# Patient Record
Sex: Male | Born: 1950
Health system: Southern US, Community
[De-identification: ages and names within clinical notes are randomized; demographics above are authoritative.]

## PROBLEM LIST (undated history)

## (undated) DIAGNOSIS — E785 Hyperlipidemia, unspecified: Secondary | ICD-10-CM

## (undated) DIAGNOSIS — M109 Gout, unspecified: Secondary | ICD-10-CM

## (undated) DIAGNOSIS — Z9710 Presence of artificial limb (complete) (partial), unspecified: Secondary | ICD-10-CM

## (undated) DIAGNOSIS — I10 Essential (primary) hypertension: Secondary | ICD-10-CM

## (undated) DIAGNOSIS — Z89512 Acquired absence of left leg below knee: Secondary | ICD-10-CM

## (undated) DIAGNOSIS — I639 Cerebral infarction, unspecified: Secondary | ICD-10-CM

## (undated) DIAGNOSIS — I739 Peripheral vascular disease, unspecified: Secondary | ICD-10-CM

## (undated) DIAGNOSIS — R413 Other amnesia: Secondary | ICD-10-CM

## (undated) HISTORY — PX: TONSILLECTOMY: SUR1361

---

## 2006-09-10 ENCOUNTER — Ambulatory Visit: Payer: Self-pay | Admitting: Gastroenterology

## 2006-11-25 ENCOUNTER — Ambulatory Visit: Payer: Self-pay | Admitting: Family Medicine

## 2006-11-29 ENCOUNTER — Ambulatory Visit: Payer: Self-pay | Admitting: Oncology

## 2006-12-14 ENCOUNTER — Ambulatory Visit: Payer: Self-pay | Admitting: Oncology

## 2007-01-14 ENCOUNTER — Ambulatory Visit: Payer: Self-pay | Admitting: Oncology

## 2007-09-15 ENCOUNTER — Ambulatory Visit: Payer: Self-pay | Admitting: Oncology

## 2007-09-22 ENCOUNTER — Ambulatory Visit: Payer: Self-pay | Admitting: Oncology

## 2007-10-03 ENCOUNTER — Ambulatory Visit: Payer: Self-pay | Admitting: Oncology

## 2007-10-16 ENCOUNTER — Ambulatory Visit: Payer: Self-pay | Admitting: Oncology

## 2007-11-16 ENCOUNTER — Ambulatory Visit: Payer: Self-pay | Admitting: Oncology

## 2016-02-21 DIAGNOSIS — R079 Chest pain, unspecified: Secondary | ICD-10-CM | POA: Diagnosis not present

## 2016-02-21 DIAGNOSIS — I10 Essential (primary) hypertension: Secondary | ICD-10-CM | POA: Diagnosis not present

## 2016-02-21 DIAGNOSIS — I426 Alcoholic cardiomyopathy: Secondary | ICD-10-CM | POA: Diagnosis not present

## 2016-02-22 DIAGNOSIS — I426 Alcoholic cardiomyopathy: Secondary | ICD-10-CM | POA: Diagnosis not present

## 2016-02-22 DIAGNOSIS — E784 Other hyperlipidemia: Secondary | ICD-10-CM | POA: Diagnosis not present

## 2016-02-22 DIAGNOSIS — I10 Essential (primary) hypertension: Secondary | ICD-10-CM | POA: Diagnosis not present

## 2016-02-22 DIAGNOSIS — R079 Chest pain, unspecified: Secondary | ICD-10-CM | POA: Diagnosis not present

## 2016-02-22 DIAGNOSIS — Z125 Encounter for screening for malignant neoplasm of prostate: Secondary | ICD-10-CM | POA: Diagnosis not present

## 2016-02-22 DIAGNOSIS — R5381 Other malaise: Secondary | ICD-10-CM | POA: Diagnosis not present

## 2016-02-24 DIAGNOSIS — I426 Alcoholic cardiomyopathy: Secondary | ICD-10-CM | POA: Diagnosis not present

## 2016-02-24 DIAGNOSIS — R079 Chest pain, unspecified: Secondary | ICD-10-CM | POA: Diagnosis not present

## 2016-03-06 DIAGNOSIS — I10 Essential (primary) hypertension: Secondary | ICD-10-CM | POA: Diagnosis not present

## 2016-04-06 DIAGNOSIS — I119 Hypertensive heart disease without heart failure: Secondary | ICD-10-CM | POA: Diagnosis not present

## 2016-04-12 DIAGNOSIS — I119 Hypertensive heart disease without heart failure: Secondary | ICD-10-CM | POA: Diagnosis not present

## 2016-06-20 ENCOUNTER — Emergency Department
Admission: EM | Admit: 2016-06-20 | Discharge: 2016-06-20 | Disposition: A | Discharge status: Home / Self Care | Payer: Medicare Other | Attending: Emergency Medicine | Admitting: Emergency Medicine

## 2016-06-20 ENCOUNTER — Emergency Department: Payer: Medicare Other

## 2016-06-20 ENCOUNTER — Encounter: Payer: Self-pay | Admitting: Emergency Medicine

## 2016-06-20 DIAGNOSIS — Z7982 Long term (current) use of aspirin: Secondary | ICD-10-CM | POA: Diagnosis not present

## 2016-06-20 DIAGNOSIS — Z79899 Other long term (current) drug therapy: Secondary | ICD-10-CM | POA: Diagnosis not present

## 2016-06-20 DIAGNOSIS — Z87891 Personal history of nicotine dependence: Secondary | ICD-10-CM | POA: Insufficient documentation

## 2016-06-20 DIAGNOSIS — I10 Essential (primary) hypertension: Secondary | ICD-10-CM | POA: Diagnosis not present

## 2016-06-20 DIAGNOSIS — R42 Dizziness and giddiness: Secondary | ICD-10-CM | POA: Insufficient documentation

## 2016-06-20 DIAGNOSIS — R001 Bradycardia, unspecified: Secondary | ICD-10-CM | POA: Diagnosis not present

## 2016-06-20 DIAGNOSIS — I119 Hypertensive heart disease without heart failure: Secondary | ICD-10-CM | POA: Diagnosis not present

## 2016-06-20 HISTORY — DX: Essential (primary) hypertension: I10

## 2016-06-20 LAB — CBC
HEMATOCRIT: 38.2 % — AB (ref 40.0–52.0)
HEMOGLOBIN: 13.1 g/dL (ref 13.0–18.0)
MCH: 28.7 pg (ref 26.0–34.0)
MCHC: 34.4 g/dL (ref 32.0–36.0)
MCV: 83.5 fL (ref 80.0–100.0)
Platelets: 258 10*3/uL (ref 150–440)
RBC: 4.57 MIL/uL (ref 4.40–5.90)
RDW: 14.7 % — ABNORMAL HIGH (ref 11.5–14.5)
WBC: 14.9 10*3/uL — ABNORMAL HIGH (ref 3.8–10.6)

## 2016-06-20 LAB — TROPONIN I
Troponin I: <0.03 ng/mL (ref <0.03)
Troponin I: <0.03 ng/mL (ref <0.03)

## 2016-06-20 LAB — COMPREHENSIVE METABOLIC PANEL
ALBUMIN: 3.9 g/dL (ref 3.5–5.0)
ALK PHOS: 84 U/L (ref 38–126)
ALT: 6 U/L — ABNORMAL LOW (ref 17–63)
ANION GAP: 7 (ref 5–15)
AST: 17 U/L (ref 15–41)
BILIRUBIN TOTAL: 0.2 mg/dL — AB (ref 0.3–1.2)
BUN: 29 mg/dL — AB (ref 6–20)
CALCIUM: 8.8 mg/dL — AB (ref 8.9–10.3)
CO2: 26 mmol/L (ref 22–32)
Chloride: 99 mmol/L — ABNORMAL LOW (ref 101–111)
Creatinine, Ser: 1.92 mg/dL — ABNORMAL HIGH (ref 0.61–1.24)
GFR calc Af Amer: 40 mL/min — ABNORMAL LOW (ref >60)
GFR, EST NON AFRICAN AMERICAN: 34 mL/min — AB (ref >60)
GLUCOSE: 105 mg/dL — AB (ref 65–99)
Potassium: 3.6 mmol/L (ref 3.5–5.1)
Sodium: 132 mmol/L — ABNORMAL LOW (ref 135–145)
TOTAL PROTEIN: 8.4 g/dL — AB (ref 6.5–8.1)

## 2016-06-20 NOTE — ED Provider Notes (Signed)
[facility] Emergency Department Provider Note   ____________________________________________   None    (approximate)  I have reviewed the triage vital signs and the nursing notes.   HISTORY  Chief Complaint Dizziness    HPI Jordan Mills is a 65 y.o. male patient reports he works third shift. At about 4:30 while he was at work he got dizzy vertiginous spinning both arms began to be achy and his right great toe got numb. Only the great toe got numb. When I see him in the ER he is no longer spinning or vertiginous at all he denies ever having nausea or vomiting he says his arms are still a little bit achy but much better and his right great toe still feels numb. On examination the touching his toe feels normal. Things seem to influence the symptoms including exertion. He was not short of breath or sweaty with the symptoms. He had no chest pain.   Past Medical History:  Diagnosis Date  . Hypertension     There are no active problems to display for this patient.   History reviewed. No pertinent surgical history.  Prior to Admission medications   Medication Sig Start Date End Date Taking? Authorizing Provider  amLODipine (NORVASC) 5 MG tablet Take 5 mg by mouth 2 (two) times daily.   Yes Historical Provider, MD  aspirin EC 81 MG tablet Take 81 mg by mouth daily.   Yes Historical Provider, MD  lisinopril-hydrochlorothiazide (PRINZIDE,ZESTORETIC) 20-12.5 MG tablet Take 2 tablets by mouth daily.   Yes Historical Provider, MD  metoprolol (LOPRESSOR) 50 MG tablet Take 50 mg by mouth 2 (two) times daily.   Yes Historical Provider, MD    Allergies Review of patient's allergies indicates no known allergies.  No family history on file.  Social History Social History  Substance Use Topics  . Smoking status: Former Research scientist (life sciences)  . Smokeless tobacco: Never Used  . Alcohol use No    Review of Systems Constitutional: No fever/chills Eyes: No visual  changes. ENT: No sore throat. Cardiovascular: Denies chest pain. Respiratory: Denies shortness of breath. Gastrointestinal: No abdominal pain.  No nausea, no vomiting.  No diarrhea.  No constipation. Genitourinary: Negative for dysuria. Musculoskeletal: Negative for back pain. Skin: Negative for rash. Neurological: Negative for headaches, focal weakness or numbness Except for the great toe.Marland Kitchen  10-point ROS otherwise negative.  ____________________________________________   PHYSICAL EXAM:  VITAL SIGNS: ED Triage Vitals  Enc Vitals Group     BP 06/20/16 0528 (!) 178/64     Pulse Rate 06/20/16 0528 (!) 56     Resp 06/20/16 0528 18     Temp 06/20/16 0528 97.9 F (36.6 C)     Temp Source 06/20/16 0528 Oral     SpO2 06/20/16 0528 99 %     Weight 06/20/16 0531 150 lb (68 kg)     Height 06/20/16 0531 5\' 6"  (1.676 m)     Head Circumference --      Peak Flow --      Pain Score 06/20/16 0531 8     Pain Loc --      Pain Edu? --      Excl. in [place]? --     Constitutional: Alert and oriented. Well appearing and in no acute distress. Eyes: Conjunctivae are normal. PERRL. EOMI. Head: Atraumatic. Nose: No congestion/rhinnorhea. Mouth/Throat: Mucous membranes are moist.  Oropharynx non-erythematous. Neck: No stridor.   Cardiovascular: Normal rate, regular rhythm. Grossly normal heart sounds.  Good peripheral circulation.Except for that I cannot palpate the pulses in the right foot Respiratory: Normal respiratory effort.  No retractions. Lungs CTAB. Gastrointestinal: Soft and nontender. No distention. No abdominal bruits. No CVA tenderness. Musculoskeletal: No lower extremity tenderness nor edema.  No joint effusions. Neurologic:  Normal speech and language. No gross focal neurologic deficits are appreciated Cranial nerves II through XII appeared to be intact sensation is intact except for the right great toe subjectively is numb but light touch is normal. Motor is 5 over 5 throughout  cerebellar finger-nose and rapid alternating movements and hands are normal.. No gait instability. Skin:  Skin is warm, dry and intact. No rash noted. Psychiatric: Mood and affect are normal. Speech and behavior are normal.  ____________________________________________   LABS (all labs ordered are listed, but only abnormal results are displayed)  Labs Reviewed  CBC - Abnormal; Notable for the following:       Result Value   WBC 14.9 (*)    HCT 38.2 (*)    RDW 14.7 (*)    All other components within normal limits  COMPREHENSIVE METABOLIC PANEL - Abnormal; Notable for the following:    Sodium 132 (*)    Chloride 99 (*)    Glucose, Bld 105 (*)    BUN 29 (*)    Creatinine, Ser 1.92 (*)    Calcium 8.8 (*)    Total Protein 8.4 (*)    ALT 6 (*)    Total Bilirubin 0.2 (*)    GFR calc non Af Amer 34 (*)    GFR calc Af Amer 40 (*)    All other components within normal limits  TROPONIN I  TROPONIN I   ____________________________________________  EKG  KG read and interpreted by me shows sinus bradycardia at a rate of 44 per the computer patient has topics H0 beats causing atrial bigeminy some nonspecific changes in the ST T waves. ____________________________________________  RADIOLOGY CLINICAL DATA:  Sudden onset dizziness, bilateral arm pain and right foot numbness.  EXAM: CT HEAD WITHOUT CONTRAST  TECHNIQUE: Contiguous axial images were obtained from the base of the skull through the vertex without intravenous contrast.  COMPARISON:  None.  FINDINGS: Brain: No mass lesion, intraparenchymal hemorrhage or extra-axial collection. No evidence of acute cortical infarct. Brain parenchyma and CSF-containing spaces are normal for age.  Vascular: No hyperdense vessel or atherosclerotic calcification.  Skull: Normal visualized skull base, calvarium and extracranial soft tissues.  Sinuses/Orbits: No sinus fluid levels or advanced mucosal thickening. No mastoid  effusion. Normal orbits.  IMPRESSION: Normal head CT for age.  No acute intracranial abnormality.   Electronically Signed   By: Ulyses Jarred M.D.   On: 06/20/2016 06:13 Study Result   CLINICAL DATA:  Dizziness and bilateral arm pain  EXAM: CHEST  2 VIEW  COMPARISON:  Chest radiograph 12/03/2006  FINDINGS: Lungs are mildly hyperexpanded. Cardiomediastinal contours are normal. No focal airspace consolidation or pulmonary edema. No pleural effusion or pneumothorax.  IMPRESSION: 1. Mildly hyperexpanded lungs, likely indicating COPD. 2. No focal airspace disease.   Electronically Signed   By: Ulyses Jarred M.D.   On: 06/20/2016 06:24    ____________________________________________   PROCEDURES  Procedure(s) performed:   Procedures  Critical Care performed:   ____________________________________________   INITIAL IMPRESSION / ASSESSMENT AND PLAN / ED COURSE  Pertinent labs & imaging results that were available during my care of the patient were reviewed by me and considered in my medical decision making (see chart for details).  Clinical Course     ____________________________________________   FINAL CLINICAL IMPRESSION(S) / ED DIAGNOSES  Final diagnoses:  Dizziness  Vertigo  Bradycardia      NEW MEDICATIONS STARTED DURING THIS VISIT:  New Prescriptions   No medications on file     Note:  This document was prepared using Dragon voice recognition software and may include unintentional dictation errors.    Nena Polio, MD 06/20/16 [phone]

## 2016-06-20 NOTE — ED Notes (Signed)
Pt returned from CT and X-ray.

## 2016-06-20 NOTE — ED Notes (Signed)
Pt sitting up in bed - family at bedside  Pt reports numbness to his right great toe remains plus bilateral arm pain/soreness   Swallow study performed   Ginger ale then provided

## 2016-06-20 NOTE — Discharge Instructions (Signed)
Please return for any further symptoms. Dr. Donivan Scull office should call you later today to schedule a appointment possibly tomorrow. Don't forget also to follow-up with your regular doctor. Have him check the labs here that we did today. Most of the doctors in the area can get into the computer system without any difficulty and look at the labs in the office.

## 2016-06-20 NOTE — ED Triage Notes (Signed)
Patient ambulatory to triage with steady gait, without difficulty or distress noted; pt reports while at work, sudden onset 430am of dizziness, bilat arm pain and right great toe numb; denies hx; pt taken immed to room 25 via w/c for further eval; Dr Beather Arbour notified and care nurse to room

## 2016-06-22 ENCOUNTER — Ambulatory Visit: Payer: Medicare Other | Admitting: Internal Medicine

## 2016-06-26 DIAGNOSIS — R079 Chest pain, unspecified: Secondary | ICD-10-CM | POA: Diagnosis not present

## 2016-06-26 DIAGNOSIS — R001 Bradycardia, unspecified: Secondary | ICD-10-CM | POA: Diagnosis not present

## 2016-06-26 DIAGNOSIS — R42 Dizziness and giddiness: Secondary | ICD-10-CM | POA: Diagnosis not present

## 2017-02-06 DIAGNOSIS — I119 Hypertensive heart disease without heart failure: Secondary | ICD-10-CM | POA: Diagnosis not present

## 2017-02-06 DIAGNOSIS — D229 Melanocytic nevi, unspecified: Secondary | ICD-10-CM | POA: Diagnosis not present

## 2017-02-06 DIAGNOSIS — I10 Essential (primary) hypertension: Secondary | ICD-10-CM | POA: Diagnosis not present

## 2017-02-22 DIAGNOSIS — D485 Neoplasm of uncertain behavior of skin: Secondary | ICD-10-CM | POA: Diagnosis not present

## 2017-02-22 DIAGNOSIS — L821 Other seborrheic keratosis: Secondary | ICD-10-CM | POA: Diagnosis not present

## 2017-02-25 DIAGNOSIS — M1A0710 Idiopathic chronic gout, right ankle and foot, without tophus (tophi): Secondary | ICD-10-CM | POA: Diagnosis not present

## 2017-02-25 DIAGNOSIS — I119 Hypertensive heart disease without heart failure: Secondary | ICD-10-CM | POA: Diagnosis not present

## 2017-02-25 DIAGNOSIS — Z789 Other specified health status: Secondary | ICD-10-CM | POA: Diagnosis not present

## 2017-05-16 ENCOUNTER — Emergency Department
Admission: EM | Admit: 2017-05-16 | Discharge: 2017-05-16 | Disposition: A | Discharge status: Home / Self Care | Payer: Medicare Other | Attending: Emergency Medicine | Admitting: Emergency Medicine

## 2017-05-16 DIAGNOSIS — M79671 Pain in right foot: Secondary | ICD-10-CM | POA: Diagnosis present

## 2017-05-16 DIAGNOSIS — Z79899 Other long term (current) drug therapy: Secondary | ICD-10-CM | POA: Diagnosis not present

## 2017-05-16 DIAGNOSIS — F17210 Nicotine dependence, cigarettes, uncomplicated: Secondary | ICD-10-CM | POA: Diagnosis not present

## 2017-05-16 DIAGNOSIS — Z7982 Long term (current) use of aspirin: Secondary | ICD-10-CM | POA: Diagnosis not present

## 2017-05-16 DIAGNOSIS — M10071 Idiopathic gout, right ankle and foot: Secondary | ICD-10-CM

## 2017-05-16 HISTORY — DX: Gout, unspecified: M10.9

## 2017-05-16 MED ORDER — TRAMADOL HCL 50 MG PO TABS: 50.0000 mg | ORAL_TABLET | Freq: Two times a day (BID) | ORAL | 0 refills | Status: DC

## 2017-05-16 MED ORDER — COLCHICINE 0.6 MG PO TABS: ORAL_TABLET | ORAL | 0 refills | Status: DC

## 2017-05-16 MED ORDER — ALLOPURINOL 100 MG PO TABS
100.0000 mg | ORAL_TABLET | Freq: Once | ORAL | Status: AC
  Administered 2017-05-16: 100 mg via ORAL
  Filled 2017-05-16: qty 1

## 2017-05-16 MED ORDER — TRAMADOL HCL 50 MG PO TABS
50.0000 mg | ORAL_TABLET | Freq: Once | ORAL | Status: AC
  Administered 2017-05-16: 50 mg via ORAL
  Filled 2017-05-16: qty 1

## 2017-05-16 MED ORDER — ALLOPURINOL 100 MG PO TABS: 100.0000 mg | ORAL_TABLET | Freq: Two times a day (BID) | ORAL | 1 refills | Status: DC

## 2017-05-16 NOTE — Discharge Instructions (Addendum)
Your exam is consistent with a gout flare. Take the prescription anti-inflammatory as directed. Discuss with Dr. Lavera Guise if changing to a less expensive preventative medicine is appropriate. Drink plenty of water and rest with the foot elevated.

## 2017-05-16 NOTE — ED Notes (Signed)
Pharmacy notified for zyloprim to be sent to [facility] care tube station.

## 2017-05-16 NOTE — ED Notes (Signed)
See triage note  States he was dx'd with gout couple of months ago  Has been using chlochine w/o results    Pain is worse in the right foot unable to bear wt d/t

## 2017-05-16 NOTE — ED Triage Notes (Signed)
Pt arrived at ED with right foot pain. Pt previously  diagnosed with gout and states the medications he is taking are currently not helping. Reports pain in all toes and has been occurring x 3 months.

## 2017-05-17 DIAGNOSIS — Z789 Other specified health status: Secondary | ICD-10-CM | POA: Diagnosis not present

## 2017-05-17 DIAGNOSIS — I119 Hypertensive heart disease without heart failure: Secondary | ICD-10-CM | POA: Diagnosis not present

## 2017-05-17 DIAGNOSIS — F17200 Nicotine dependence, unspecified, uncomplicated: Secondary | ICD-10-CM | POA: Diagnosis not present

## 2017-05-17 DIAGNOSIS — M109 Gout, unspecified: Secondary | ICD-10-CM | POA: Diagnosis not present

## 2017-05-18 NOTE — ED Provider Notes (Signed)
[facility] Emergency Department Provider Note ____________________________________________  Time seen: 1905  I have reviewed the triage vital signs and the nursing notes.  HISTORY  Chief Complaint  Foot Pain  HPI Jordan Mills is a 66 y.o. male presents to the ED for evaluation of right foot pain. Patient was diagnosed with gout, about 3 months ago, he had been on daily colchicine management until about a week ago. He notes that the colchicine was effective, but admits that the prescription was quite expensive. He is run through that had a prescription and does not have any refills and is otherwise on able to continue to purchased expensive medication. He denies any recent injury, accident, or trauma. He reports now pain across the dorsum of all his toes on his right foot. He denies any fevers, chills, sweats.  Past Medical History:  Diagnosis Date  . Gout   . Hypertension     There are no active problems to display for this patient.   Past Surgical History:  Procedure Laterality Date  . TONSILLECTOMY      Prior to Admission medications   Medication Sig Start Date End Date Taking? Authorizing Provider  allopurinol (ZYLOPRIM) 100 MG tablet Take 1 tablet (100 mg total) by mouth 2 (two) times daily. 05/16/17 06/15/17  Larayne Baxley, Dannielle Karvonen, PA-C  amLODipine (NORVASC) 5 MG tablet Take 5 mg by mouth 2 (two) times daily.    [provider]  aspirin EC 81 MG tablet Take 81 mg by mouth daily.    [provider]  colchicine 0.6 MG tablet Take 2 tabs PO x 1, then 1 tab PO 1 hour later x 1  Max: 1.8 mg total dose per attack, do not repeat within 3 days. 05/16/17   Zane Pellecchia, Dannielle Karvonen, PA-C  lisinopril-hydrochlorothiazide (PRINZIDE,ZESTORETIC) 20-12.5 MG tablet Take 2 tablets by mouth daily.    [provider]  metoprolol (LOPRESSOR) 50 MG tablet Take 50 mg by mouth 2 (two) times daily.    [provider]  traMADol (ULTRAM) 50  MG tablet Take 1 tablet (50 mg total) by mouth 2 (two) times daily. 05/16/17   Yama Nielson, Dannielle Karvonen, PA-C    Allergies Patient has no known allergies.  No family history on file.  Social History Social History  Substance Use Topics  . Smoking status: Current Every Day Smoker    Packs/day: 0.20    Types: Cigarettes  . Smokeless tobacco: Never Used  . Alcohol use No    Review of Systems  Constitutional: Negative for fever. Cardiovascular: Negative for chest pain. Respiratory: Negative for shortness of breath. Gastrointestinal: Negative for abdominal pain, vomiting and diarrhea. Musculoskeletal: Negative for back pain. Right foot pain as above. Skin: Negative for rash. Neurological: Negative for headaches, focal weakness or numbness. ____________________________________________  PHYSICAL EXAM:  VITAL SIGNS: ED Triage Vitals  Enc Vitals Group     BP 05/16/17 1823 (!) 164/78     Pulse Rate 05/16/17 1823 67     Resp 05/16/17 1823 16     Temp 05/16/17 1823 98.9 F (37.2 C)     Temp Source 05/16/17 1823 Oral     SpO2 05/16/17 1823 98 %     Weight 05/16/17 1823 156 lb (70.8 kg)     Height 05/16/17 1823 5\' 6"  (1.676 m)     Head Circumference --      Peak Flow --      Pain Score 05/16/17 1821 10  Pain Loc --      Pain Edu? --      Excl. in [place]? --     Constitutional: Alert and oriented. Well appearing and in no distress. Head: Normocephalic and atraumatic. Cardiovascular: Normal rate, regular rhythm. Normal distal pulses. Respiratory: Normal respiratory effort. No wheezes/rales/rhonchi. Musculoskeletal: Right foot without any obvious deformity, dislocation, or effusion. Patient noted to have some moderate erythema over the dorsal toes and forefoot. He is exquisitely tender to palpation over the foot and toes. Ankle exam is otherwise benign without effusion. No calf or Achilles tenderness is noted. Nontender with normal range of motion in all extremities.  Neurologic:   Normal gait without ataxia. Normal speech and language. No gross focal neurologic deficits are appreciated. Skin:  Skin is warm, dry and intact. No rash noted. ____________________________________________  PROCEDURES  Allopurinol 100 mg PO Ultram 50 mg PO ____________________________________________  INITIAL IMPRESSION / ASSESSMENT AND PLAN / ED COURSE  Patient with ED evaluation of acute idiopathic gout. The right foot. Patient is discharged at this time with a prescription for allopurinol for daily gout prevention. He is also given a small perception of colchicine for acute gout flares. He is also given a small prescription of Ultram (#10) for more acute pain related to this flare. He should follow with primary care provider tomorrow as scheduled. Lortab is provided for tonight as requested. ____________________________________________  FINAL CLINICAL IMPRESSION(S) / ED DIAGNOSES  Final diagnoses:  Acute idiopathic gout of right foot       Carmie End, Dannielle Karvonen, PA-C 05/18/17 0045    Delman Kitten, MD 05/18/17 [phone]

## 2017-05-22 ENCOUNTER — Emergency Department
Admission: EM | Admit: 2017-05-22 | Discharge: 2017-05-22 | Disposition: A | Discharge status: Home / Self Care | Payer: Medicare Other | Attending: Emergency Medicine | Admitting: Emergency Medicine

## 2017-05-22 ENCOUNTER — Emergency Department: Payer: Medicare Other

## 2017-05-22 DIAGNOSIS — Z7982 Long term (current) use of aspirin: Secondary | ICD-10-CM | POA: Diagnosis not present

## 2017-05-22 DIAGNOSIS — M1A0710 Idiopathic chronic gout, right ankle and foot, without tophus (tophi): Secondary | ICD-10-CM | POA: Diagnosis not present

## 2017-05-22 DIAGNOSIS — R0789 Other chest pain: Secondary | ICD-10-CM | POA: Insufficient documentation

## 2017-05-22 DIAGNOSIS — Z789 Other specified health status: Secondary | ICD-10-CM | POA: Diagnosis not present

## 2017-05-22 DIAGNOSIS — Z79899 Other long term (current) drug therapy: Secondary | ICD-10-CM | POA: Diagnosis not present

## 2017-05-22 DIAGNOSIS — M1A9XX0 Chronic gout, unspecified, without tophus (tophi): Secondary | ICD-10-CM

## 2017-05-22 DIAGNOSIS — I119 Hypertensive heart disease without heart failure: Secondary | ICD-10-CM | POA: Diagnosis not present

## 2017-05-22 DIAGNOSIS — J449 Chronic obstructive pulmonary disease, unspecified: Secondary | ICD-10-CM | POA: Diagnosis not present

## 2017-05-22 DIAGNOSIS — F17210 Nicotine dependence, cigarettes, uncomplicated: Secondary | ICD-10-CM | POA: Insufficient documentation

## 2017-05-22 DIAGNOSIS — R079 Chest pain, unspecified: Secondary | ICD-10-CM | POA: Diagnosis not present

## 2017-05-22 LAB — CBC
HEMATOCRIT: 37.4 % — AB (ref 40.0–52.0)
Hemoglobin: 12.3 g/dL — ABNORMAL LOW (ref 13.0–18.0)
MCH: 28 pg (ref 26.0–34.0)
MCHC: 32.8 g/dL (ref 32.0–36.0)
MCV: 85.4 fL (ref 80.0–100.0)
PLATELETS: 306 10*3/uL (ref 150–440)
RBC: 4.39 MIL/uL — ABNORMAL LOW (ref 4.40–5.90)
RDW: 14.7 % — AB (ref 11.5–14.5)
WBC: 18.3 10*3/uL — AB (ref 3.8–10.6)

## 2017-05-22 LAB — BASIC METABOLIC PANEL
Anion gap: 9 (ref 5–15)
BUN: 52 mg/dL — AB (ref 6–20)
CHLORIDE: 104 mmol/L (ref 101–111)
CO2: 23 mmol/L (ref 22–32)
CREATININE: 2.48 mg/dL — AB (ref 0.61–1.24)
Calcium: 9.6 mg/dL (ref 8.9–10.3)
GFR calc Af Amer: 29 mL/min — ABNORMAL LOW (ref >60)
GFR, EST NON AFRICAN AMERICAN: 25 mL/min — AB (ref >60)
GLUCOSE: 101 mg/dL — AB (ref 65–99)
POTASSIUM: 3.8 mmol/L (ref 3.5–5.1)
SODIUM: 136 mmol/L (ref 135–145)

## 2017-05-22 LAB — TROPONIN I
Troponin I: <0.03 ng/mL (ref <0.03)
Troponin I: <0.03 ng/mL (ref <0.03)

## 2017-05-22 MED ORDER — DICLOFENAC EPOLAMINE 1.3 % TD PTCH
1.0000 | MEDICATED_PATCH | Freq: Once | TRANSDERMAL | Status: DC
  Administered 2017-05-22: 1 via TRANSDERMAL
  Filled 2017-05-22: qty 1

## 2017-05-22 NOTE — ED Triage Notes (Signed)
Pt sent from Dr. Lavera Guise office with an abnormal ECG, states he had chest pain a couple of days ago.

## 2017-05-22 NOTE — ED Notes (Signed)
ED Provider at bedside. 

## 2017-05-22 NOTE — ED Notes (Signed)
SEE DOWNTIME PAPER WORK  

## 2017-05-22 NOTE — ED Provider Notes (Signed)
[facility] Emergency Department Provider Note  ____________________________________________  Time seen: Approximately 7:00 PM  I have reviewed the triage vital signs and the nursing notes.   HISTORY  Chief Complaint Chest Pain    HPI Jordan Mills is a 66 y.o. male sent to the ED by his primary care doctor for evaluation of chest pain. 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 Pain lasted for about one second and was fleeting and then disappeared again. No specific aggravating or alleviating factors. He had not had pain like this before. He has not had any change in his exercise tolerance or any other exertional symptoms recently. He reports being pain-free for the past couple of days without any acute symptoms other than his chronic gout pain of the right foot that has been bothering him for 3 months. He came to the ED a week ago, has taken colchicine and allopurinol, and since that ED visit his pain is improving. He is still taking tramadol intermittently which is also helpful. He followed up with his primary care doctor who is monitoring this. No new injuries, fevers or chills.     Past Medical History:  Diagnosis Date  . Gout   . Hypertension      There are no active problems to display for this patient.    Past Surgical History:  Procedure Laterality Date  . TONSILLECTOMY       Prior to Admission medications   Medication Sig Start Date End Date Taking? Authorizing Provider  allopurinol (ZYLOPRIM) 100 MG tablet Take 1 tablet (100 mg total) by mouth 2 (two) times daily. 05/16/17 06/15/17 Yes Menshew, Dannielle Karvonen, PA-C  aspirin EC 81 MG tablet Take 81 mg by mouth daily.   Yes [provider]   colchicine 0.6 MG tablet Take 2 tabs PO x 1, then 1 tab PO 1 hour later x 1  Max: 1.8 mg total dose per attack, do not repeat within 3 days. 05/16/17  Yes Menshew, Dannielle Karvonen, PA-C  indomethacin (INDOCIN) 25 MG capsule Take 25 mg by mouth 3 (three) times daily with meals.   Yes [provider]  lisinopril-hydrochlorothiazide (PRINZIDE,ZESTORETIC) 20-12.5 MG tablet Take 2 tablets by mouth daily.   Yes [provider]  traMADol (ULTRAM) 50 MG tablet Take 1 tablet (50 mg total) by mouth 2 (two) times daily. 05/16/17  Yes Menshew, Dannielle Karvonen, PA-C  amLODipine (NORVASC) 5 MG tablet Take 5 mg by mouth 2 (two) times daily.    [provider]  metoprolol (LOPRESSOR) 50 MG tablet Take 50 mg by mouth 2 (two) times daily.    [provider]     Allergies Patient has no known allergies.   No family history on file.  Social History Social History  Substance Use Topics  . Smoking status: Current Every Day Smoker    Packs/day: 0.20    Types: Cigarettes  . Smokeless tobacco: Never Used  . Alcohol use No    Review of Systems  Constitutional:   No fever or chills.  ENT:   No sore throat. No rhinorrhea. Cardiovascular:   Positive as above fleeting intermittent chest pain without syncope. Respiratory:   No dyspnea or cough. Gastrointestinal:   Negative for abdominal  pain, vomiting and diarrhea.  Musculoskeletal:   Positive as above chronic right foot pain at the fifth toe All other systems reviewed and are negative except as documented above in ROS and HPI.  ____________________________________________   PHYSICAL EXAM:  VITAL SIGNS: ED Triage Vitals  Enc Vitals Group     BP 05/22/17 1259 110/66     Pulse Rate 05/22/17 1259 67     Resp 05/22/17 1259 16     Temp 05/22/17 1259 98 F (36.7 C)     Temp src --      SpO2 05/22/17 1259 99 %     Weight 05/22/17 1254 156 lb (70.8 kg)     Height 05/22/17 1254 5\' 6"  (1.676 m)     Head Circumference --       Peak Flow --      Pain Score 05/22/17 1828 10     Pain Loc --      Pain Edu? --      Excl. in [place]? --     Vital signs reviewed, nursing assessments reviewed.   Constitutional:   Alert and oriented. Well appearing and in no distress. Eyes:   No scleral icterus.  EOMI. No nystagmus. No conjunctival pallor. PERRL. ENT   Head:   Normocephalic and atraumatic.   Nose:   No congestion/rhinnorhea.    Mouth/Throat:   MMM, no pharyngeal erythema. No peritonsillar mass.    Neck:   No meningismus. Full ROM Hematological/Lymphatic/Immunilogical:   No cervical lymphadenopathy. Cardiovascular:   RRR. Symmetric bilateral radial and DP pulses.  No murmurs.  Respiratory:   Normal respiratory effort without tachypnea/retractions. Breath sounds are clear and equal bilaterally. No wheezes/rales/rhonchi. Gastrointestinal:   Soft and nontender. Non distended. There is no CVA tenderness.  No rebound, rigidity, or guarding. Genitourinary:   deferred Musculoskeletal:   Normal range of motion in all extremities. No joint effusions.  No lower extremity tenderness.  No edema.Chest wall nontender. Right foot noninflamed without erythema swelling. There is some mild tenderness at the area of the right fifth MTP joint. Neurologic:   Normal speech and language.  Motor grossly intact. No gross focal neurologic deficits are appreciated.  Skin:    Skin is warm, dry and intact. No rash noted.  No petechiae, purpura, or bullae.  ____________________________________________    LABS (pertinent positives/negatives) (all labs ordered are listed, but only abnormal results are displayed) Labs Reviewed  BASIC METABOLIC PANEL - Abnormal; Notable for the following:       Result Value   Glucose, Bld 101 (*)    BUN 52 (*)    Creatinine, Ser 2.48 (*)    GFR calc non Af Amer 25 (*)    GFR calc Af Amer 29 (*)    All other components within normal limits  CBC - Abnormal; Notable for the following:    WBC 18.3  (*)    RBC 4.39 (*)    Hemoglobin 12.3 (*)    HCT 37.4 (*)    RDW 14.7 (*)    All other components within normal limits  TROPONIN I  TROPONIN I   ____________________________________________   EKG  Interpreted by me Sinus rhythm rate of 72, normal axis and intervals. Normal QRS ST segments and T waves. No acute ischemic changes.  Patient brings an EKG from the clinic which is dated 12/01/2007. This shows sinus rhythm rate of 84, normal axis and intervals. Slight ST elevation in V1 and V2 without reciprocal changes and otherwise nonacute T  waves. Unclear if this had been date recorded wrong and was actually from today, or as an old EKG for comparison.  ____________________________________________    RADIOLOGY  Dg Chest 2 View  Result Date: 05/22/2017 CLINICAL DATA:  Chest pain with abnormal ECG EXAM: CHEST  2 VIEW COMPARISON:  Chest radiograph 06/20/2016 FINDINGS: The heart size and mediastinal contours are within normal limits. Both lungs are clear. The visualized skeletal structures are unremarkable. IMPRESSION: No active cardiopulmonary disease. Electronically Signed   By: Ulyses Jarred M.D.   On: 05/22/2017 13:25    ____________________________________________   PROCEDURES Procedures  ____________________________________________   INITIAL IMPRESSION / ASSESSMENT AND PLAN / ED COURSE  Pertinent labs & imaging results that were available during my care of the patient were reviewed by me and considered in my medical decision making (see chart for details).  Patient well appearing no acute distress, sent to the ED for evaluation of chest pain which appears to be atypical and fleeting and not consistent with ACS.Considering the patient's symptoms, medical history, and physical examination today, I have low suspicion for ACS, PE, TAD, pneumothorax, carditis, mediastinitis, pneumonia, CHF, or sepsis.  Initial chest x-ray EKG and troponin all negative. We'll get a second  troponin to check a delta. Given the patient's CK D, I would expect that if he had any kind of cardiac event the troponin would be positive, so the negative results are highly reassuring. I did recommend the patient follow up with cardiology for further risk stratification.  I encouraged the patient to continue taking his medications and to follow up with primary care for continued management of his gout.       ----------------------------------------- 7:41 PM on 05/22/2017 -----------------------------------------  Delta troponin negative. Vital signs are stable, patient is calm comfortable and isn't medicated the ED. We'll discharge home. ____________________________________________   FINAL CLINICAL IMPRESSION(S) / ED DIAGNOSES  Final diagnoses:  Atypical chest pain  Chronic gout without tophus, unspecified cause, unspecified site      New Prescriptions   No medications on file     Portions of this note were generated with dragon dictation software. Dictation errors may occur despite best attempts at proofreading.    Carrie Mew, MD 05/22/17 [phone]

## 2017-05-22 NOTE — Discharge Instructions (Signed)
Your chest xray, ekg, and labs in the ED were unremarkable. Follow up with cardiology for further evaluation of your symptoms. Continue seeing your primary care doctor for management of your gout.

## 2017-06-05 DIAGNOSIS — I119 Hypertensive heart disease without heart failure: Secondary | ICD-10-CM | POA: Diagnosis not present

## 2017-06-05 DIAGNOSIS — F17200 Nicotine dependence, unspecified, uncomplicated: Secondary | ICD-10-CM | POA: Diagnosis not present

## 2017-06-05 DIAGNOSIS — M109 Gout, unspecified: Secondary | ICD-10-CM | POA: Diagnosis not present

## 2017-06-05 DIAGNOSIS — J449 Chronic obstructive pulmonary disease, unspecified: Secondary | ICD-10-CM | POA: Diagnosis not present

## 2017-06-19 DIAGNOSIS — Z789 Other specified health status: Secondary | ICD-10-CM | POA: Diagnosis not present

## 2017-06-19 DIAGNOSIS — J449 Chronic obstructive pulmonary disease, unspecified: Secondary | ICD-10-CM | POA: Diagnosis not present

## 2017-06-19 DIAGNOSIS — M109 Gout, unspecified: Secondary | ICD-10-CM | POA: Diagnosis not present

## 2017-06-19 DIAGNOSIS — F17200 Nicotine dependence, unspecified, uncomplicated: Secondary | ICD-10-CM | POA: Diagnosis not present

## 2017-06-26 DIAGNOSIS — L02619 Cutaneous abscess of unspecified foot: Secondary | ICD-10-CM | POA: Diagnosis not present

## 2017-06-26 DIAGNOSIS — I739 Peripheral vascular disease, unspecified: Secondary | ICD-10-CM | POA: Diagnosis not present

## 2017-06-26 DIAGNOSIS — L03119 Cellulitis of unspecified part of limb: Secondary | ICD-10-CM | POA: Diagnosis not present

## 2017-06-26 DIAGNOSIS — L97512 Non-pressure chronic ulcer of other part of right foot with fat layer exposed: Secondary | ICD-10-CM | POA: Diagnosis not present

## 2017-06-27 ENCOUNTER — Other Ambulatory Visit (INDEPENDENT_AMBULATORY_CARE_PROVIDER_SITE_OTHER): Payer: Self-pay | Admitting: Vascular Surgery

## 2017-06-27 DIAGNOSIS — I96 Gangrene, not elsewhere classified: Secondary | ICD-10-CM

## 2017-06-28 ENCOUNTER — Ambulatory Visit (INDEPENDENT_AMBULATORY_CARE_PROVIDER_SITE_OTHER): Payer: Medicare Other | Admitting: Vascular Surgery

## 2017-06-28 ENCOUNTER — Encounter (INDEPENDENT_AMBULATORY_CARE_PROVIDER_SITE_OTHER): Payer: Self-pay | Admitting: Vascular Surgery

## 2017-06-28 ENCOUNTER — Other Ambulatory Visit (INDEPENDENT_AMBULATORY_CARE_PROVIDER_SITE_OTHER): Payer: Self-pay | Admitting: Vascular Surgery

## 2017-06-28 ENCOUNTER — Encounter (INDEPENDENT_AMBULATORY_CARE_PROVIDER_SITE_OTHER): Payer: Self-pay

## 2017-06-28 ENCOUNTER — Ambulatory Visit (INDEPENDENT_AMBULATORY_CARE_PROVIDER_SITE_OTHER): Payer: Medicare Other

## 2017-06-28 VITALS — BP 163/80 | HR 66 | Resp 17 | Ht 67.0 in | Wt 129.6 lb

## 2017-06-28 DIAGNOSIS — F17210 Nicotine dependence, cigarettes, uncomplicated: Secondary | ICD-10-CM

## 2017-06-28 DIAGNOSIS — I96 Gangrene, not elsewhere classified: Secondary | ICD-10-CM

## 2017-06-28 DIAGNOSIS — I7025 Atherosclerosis of native arteries of other extremities with ulceration: Secondary | ICD-10-CM | POA: Diagnosis not present

## 2017-06-28 DIAGNOSIS — F17200 Nicotine dependence, unspecified, uncomplicated: Secondary | ICD-10-CM | POA: Diagnosis not present

## 2017-06-28 DIAGNOSIS — I10 Essential (primary) hypertension: Secondary | ICD-10-CM | POA: Insufficient documentation

## 2017-06-28 NOTE — Patient Instructions (Signed)

## 2017-06-28 NOTE — Assessment & Plan Note (Signed)
blood pressure control important in reducing the progression of atherosclerotic disease. On appropriate oral medications.  

## 2017-06-28 NOTE — Progress Notes (Signed)
Patient ID: Jordan Mills, male   DOB: 30-Jul-1951, 66 y.o.   MRN: [id number]  Chief Complaint  Patient presents with  . New Patient (Initial Visit)    4th to gangrene    HPI Jordan Mills is a 66 y.o. male.  I am asked to see the patient by Dr. Cleda Mccreedy for evaluation of ulceration/gangrenous changes to toes on the right foot.  The patient reports Pain in his foot for about 4-5 months now. He has been to the ER twice and was treated for gout. He now has an ulceration between his fourth and fifth toes. The pain wakes him at night he has to dangle his foot off the bed for relief. He can only walk short distances before having to stop because of pain. His left leg is not currently that bothersome but his right foot and leg are very painful. There is no trauma, injury, or inciting event started the pain. He saw a podiatrist earlier this week who is markedly concerned about the ulceration and early gangrenous changes to the fourth and fifth toes and referred him for evaluation of his blood flow. He denies any previous knowledge or history of vascular disease but had not been checked for. On evaluation today, his right ABI 0.32 and his left ABI 0.79. He has almost no pulsatility on the right leg with fair waveforms on the left leg. This be consistent with critical ischemia of the right leg and mild to moderate left lower extremity arterial insufficiency.   Past Medical History:  Diagnosis Date  . Gout   . Hypertension     Past Surgical History:  Procedure Laterality Date  . TONSILLECTOMY      Family History No bleeding disorders, clotting disorders, autoimmune diseases, or aneurysms  Social History Social History  Substance Use Topics  . Smoking status: Current Every Day Smoker    Packs/day: 0.20    Types: Cigarettes  . Smokeless tobacco: Never Used  . Alcohol use No  No IVDU Married  No Known Allergies  Current Outpatient Prescriptions  Medication Sig Dispense Refill  .  amLODipine (NORVASC) 5 MG tablet Take 5 mg by mouth 2 (two) times daily.  10  . amoxicillin-clavulanate (AUGMENTIN) 875-125 MG tablet Take by mouth.    Marland Kitchen aspirin EC 81 MG tablet Take 81 mg by mouth daily.    . clotrimazole (MYCELEX) 10 MG troche Take by mouth.    . colchicine 0.6 MG tablet Take 2 tabs PO x 1, then 1 tab PO 1 hour later x 1  Max: 1.8 mg total dose per attack, do not repeat within 3 days. 10 tablet 0  . HYDROcodone-acetaminophen (NORCO/VICODIN) 5-325 MG tablet Take by mouth.    . indomethacin (INDOCIN) 25 MG capsule Take 25 mg by mouth 3 (three) times daily with meals.    Marland Kitchen lisinopril-hydrochlorothiazide (PRINZIDE,ZESTORETIC) 20-12.5 MG tablet Take 2 tablets by mouth daily.  5  . metoprolol (LOPRESSOR) 50 MG tablet Take 50 mg by mouth 2 (two) times daily.  4  . traMADol (ULTRAM) 50 MG tablet Take 1 tablet (50 mg total) by mouth 2 (two) times daily. 10 tablet 0  . allopurinol (ZYLOPRIM) 100 MG tablet Take 1 tablet (100 mg total) by mouth 2 (two) times daily. 30 tablet 1   No current facility-administered medications for this visit.       REVIEW OF SYSTEMS (Negative unless checked)  Constitutional: '[]' Weight loss  '[]' Fever  '[]' Chills Cardiac: '[]' Chest pain   '[]'   Chest pressure   '[]' Palpitations   '[]' Shortness of breath when laying flat   '[]' Shortness of breath at rest   '[]' Shortness of breath with exertion. Vascular:  '[x]' Pain in legs with walking   '[]' Pain in legs at rest   '[]' Pain in legs when laying flat   '[x]' Claudication   '[]' Pain in feet when walking  '[x]' Pain in feet at rest  '[]' Pain in feet when laying flat   '[]' History of DVT   '[]' Phlebitis   '[]' Swelling in legs   '[]' Varicose veins   '[x]' Non-healing ulcers Pulmonary:   '[]' Uses home oxygen   '[]' Productive cough   '[]' Hemoptysis   '[]' Wheeze  '[]' COPD   '[]' Asthma Neurologic:  '[]' Dizziness  '[]' Blackouts   '[]' Seizures   '[]' History of stroke   '[]' History of TIA  '[]' Aphasia   '[]' Temporary blindness   '[]' Dysphagia   '[]' Weakness or numbness in arms   '[]' Weakness or  numbness in legs Musculoskeletal:  '[]' Arthritis   '[]' Joint swelling   '[]' Joint pain   '[]' Low back pain Hematologic:  '[]' Easy bruising  '[]' Easy bleeding   '[]' Hypercoagulable state   '[]' Anemic  '[]' Hepatitis Gastrointestinal:  '[]' Blood in stool   '[]' Vomiting blood  '[]' Gastroesophageal reflux/heartburn   '[]' Abdominal pain Genitourinary:  '[]' Chronic kidney disease   '[]' Difficult urination  '[]' Frequent urination  '[]' Burning with urination   '[]' Hematuria Skin:  '[]' Rashes   '[x]' Ulcers   '[x]' Wounds Psychological:  '[]' History of anxiety   '[]'  History of major depression.    Physical Exam BP (!) 163/80   Pulse 66   Resp 17   Ht '5\' 7"'  (1.702 m)   Wt 58.8 kg (129 lb 9.6 oz)   BMI 20.30 kg/m  Gen:  WD/WN, NAD Head: [place]/AT, No temporalis wasting.  Ear/Nose/Throat: Hearing grossly intact, nares w/o erythema or drainage, oropharynx w/o Erythema/Exudate Eyes: Conjunctiva clear, sclera non-icteric  Neck: trachea midline.  No JVD.  Pulmonary:  Good air movement, clear to auscultation bilaterally.  Cardiac: RRR, normal S1, S2, no Murmurs, rubs or gallops. Vascular:  Vessel Right Left  Radial Palpable Palpable                      Popliteal Not Palpable Not Palpable  PT Not Palpable 1+ Palpable  DP Not Palpable Trace Palpable   Gastrointestinal: soft, non-tender/non-distended.  Musculoskeletal: M/S 5/5 throughout.  No deformity or atrophy. No lower extremity edema today. Pale ulceration between toes 4 and 5 on the right foot with some dark discoloration of the tips of several toes. Neurologic: Sensation grossly intact in extremities.  Symmetrical.  Speech is fluent. Motor exam as listed above. Psychiatric: Judgment intact, Mood & affect appropriate for pt's clinical situation. Dermatologic: Right foot ulcer as described above   Radiology No results found.  Labs Recent Results (from the past 2160 hour(s))  Basic metabolic panel     Status: Abnormal   Collection Time: 05/22/17  1:02 PM  Result Value Ref Range    Sodium 136 135 - 145 mmol/L   Potassium 3.8 3.5 - 5.1 mmol/L   Chloride 104 101 - 111 mmol/L   CO2 23 22 - 32 mmol/L   Glucose, Bld 101 (H) 65 - 99 mg/dL   BUN 52 (H) 6 - 20 mg/dL   Creatinine, Ser 2.48 (H) 0.61 - 1.24 mg/dL   Calcium 9.6 8.9 - 10.3 mg/dL   GFR calc non Af Amer 25 (L) >60 mL/min   GFR calc Af Amer 29 (L) >60 mL/min    Comment: (NOTE) The eGFR has been calculated using  the CKD EPI equation. This calculation has not been validated in all clinical situations. eGFR's persistently <60 mL/min signify possible Chronic Kidney Disease.    Anion gap 9 5 - 15  CBC     Status: Abnormal   Collection Time: 05/22/17  1:02 PM  Result Value Ref Range   WBC 18.3 (H) 3.8 - 10.6 K/uL   RBC 4.39 (L) 4.40 - 5.90 MIL/uL   Hemoglobin 12.3 (L) 13.0 - 18.0 g/dL   HCT 37.4 (L) 40.0 - 52.0 %   MCV 85.4 80.0 - 100.0 fL   MCH 28.0 26.0 - 34.0 pg   MCHC 32.8 32.0 - 36.0 g/dL   RDW 14.7 (H) 11.5 - 14.5 %   Platelets 306 150 - 440 K/uL  Troponin I     Status: None   Collection Time: 05/22/17  1:02 PM  Result Value Ref Range   Troponin I <0.03 <0.03 ng/mL  Troponin I     Status: None   Collection Time: 05/22/17  7:01 PM  Result Value Ref Range   Troponin I <0.03 <0.03 ng/mL    Assessment/Plan:  Hypertension blood pressure control important in reducing the progression of atherosclerotic disease. On appropriate oral medications.   Tobacco use disorder We had a discussion for approximately 3 minutes regarding the absolute need for smoking cessation due to the deleterious nature of tobacco on the vascular system. We discussed the tobacco use would diminish patency of any intervention, and likely significantly worsen progressio of disease. We discussed multiple agents for quitting including replacement therapy or medications to reduce cravings such as Chantix. The patient voices their understanding of the importance of smoking cessation.  Atherosclerosis of native arteries of the  extremities with ulceration ([place]) his right ABI 0.32 and his left ABI 0.79. He has almost no pulsatility on the right leg with fair waveforms on the left leg. This be consistent with critical ischemia of the right leg and mild to moderate left lower extremity arterial insufficiency. This is clearly a critical and limb threatening situation.   Recommend:  The patient has evidence of severe atherosclerotic changes of both lower extremities associated with ulceration and tissue loss of the foot.  This represents a limb threatening ischemia and places the patient at the risk for limb loss.  Patient should undergo angiography of the lower extremities with the hope for intervention for limb salvage.  The risks and benefits as well as the alternative therapies was discussed in detail with the patient.  All questions were answered.  Patient agrees to proceed with angiography.  The patient will follow up with me in the office after the procedure.        [name] 06/28/2017, 9:36 AM   This note was created with Dragon medical transcription system.  Any errors from dictation are unintentional.

## 2017-06-28 NOTE — Assessment & Plan Note (Signed)
his right ABI 0.32 and his left ABI 0.79. He has almost no pulsatility on the right leg with fair waveforms on the left leg. This be consistent with critical ischemia of the right leg and mild to moderate left lower extremity arterial insufficiency. This is clearly a critical and limb threatening situation.   Recommend:  The patient has evidence of severe atherosclerotic changes of both lower extremities associated with ulceration and tissue loss of the foot.  This represents a limb threatening ischemia and places the patient at the risk for limb loss.  Patient should undergo angiography of the lower extremities with the hope for intervention for limb salvage.  The risks and benefits as well as the alternative therapies was discussed in detail with the patient.  All questions were answered.  Patient agrees to proceed with angiography.  The patient will follow up with me in the office after the procedure.

## 2017-06-28 NOTE — Assessment & Plan Note (Signed)

## 2017-06-30 MED ORDER — CEFAZOLIN SODIUM-DEXTROSE 2-4 GM/100ML-% IV SOLN
2.0000 g | Freq: Once | INTRAVENOUS | Status: AC
  Administered 2017-07-01: 2 g via INTRAVENOUS

## 2017-07-01 ENCOUNTER — Ambulatory Visit
Admission: RE | Admit: 2017-07-01 | Discharge: 2017-07-01 | Disposition: A | Discharge status: Home / Self Care | Payer: Medicare Other | Source: Ambulatory Visit | Attending: Vascular Surgery | Admitting: Vascular Surgery

## 2017-07-01 ENCOUNTER — Other Ambulatory Visit
Admission: RE | Admit: 2017-07-01 | Discharge: 2017-07-01 | Disposition: A | Discharge status: Home / Self Care | Payer: Medicare Other | Source: Ambulatory Visit | Attending: Vascular Surgery | Admitting: Vascular Surgery

## 2017-07-01 ENCOUNTER — Encounter
Admission: RE | Disposition: A | Discharge status: Home / Self Care | Payer: Self-pay | Source: Ambulatory Visit | Attending: Vascular Surgery

## 2017-07-01 DIAGNOSIS — Z9889 Other specified postprocedural states: Secondary | ICD-10-CM | POA: Diagnosis not present

## 2017-07-01 DIAGNOSIS — M109 Gout, unspecified: Secondary | ICD-10-CM | POA: Insufficient documentation

## 2017-07-01 DIAGNOSIS — F17210 Nicotine dependence, cigarettes, uncomplicated: Secondary | ICD-10-CM | POA: Diagnosis not present

## 2017-07-01 DIAGNOSIS — I10 Essential (primary) hypertension: Secondary | ICD-10-CM | POA: Insufficient documentation

## 2017-07-01 DIAGNOSIS — L97519 Non-pressure chronic ulcer of other part of right foot with unspecified severity: Secondary | ICD-10-CM | POA: Insufficient documentation

## 2017-07-01 DIAGNOSIS — I739 Peripheral vascular disease, unspecified: Secondary | ICD-10-CM

## 2017-07-01 DIAGNOSIS — I7025 Atherosclerosis of native arteries of other extremities with ulceration: Secondary | ICD-10-CM | POA: Diagnosis not present

## 2017-07-01 DIAGNOSIS — Z7982 Long term (current) use of aspirin: Secondary | ICD-10-CM | POA: Diagnosis not present

## 2017-07-01 DIAGNOSIS — I70238 Atherosclerosis of native arteries of right leg with ulceration of other part of lower right leg: Secondary | ICD-10-CM | POA: Diagnosis not present

## 2017-07-01 DIAGNOSIS — I70212 Atherosclerosis of native arteries of extremities with intermittent claudication, left leg: Secondary | ICD-10-CM | POA: Diagnosis not present

## 2017-07-01 HISTORY — PX: LOWER EXTREMITY ANGIOGRAPHY: CATH118251

## 2017-07-01 LAB — BUN: BUN: 28 mg/dL — AB (ref 6–20)

## 2017-07-01 LAB — CREATININE, SERUM
CREATININE: 1.66 mg/dL — AB (ref 0.61–1.24)
GFR calc non Af Amer: 41 mL/min — ABNORMAL LOW (ref >60)
GFR, EST AFRICAN AMERICAN: 47 mL/min — AB (ref >60)

## 2017-07-01 SURGERY — LOWER EXTREMITY ANGIOGRAPHY
Anesthesia: Moderate Sedation | Laterality: Right

## 2017-07-01 MED ORDER — MIDAZOLAM HCL 2 MG/2ML IJ SOLN
INTRAMUSCULAR | Status: DC | PRN
  Administered 2017-07-01: 1 mg via INTRAVENOUS
  Administered 2017-07-01: 2 mg via INTRAVENOUS
  Administered 2017-07-01 (×4): 1 mg via INTRAVENOUS

## 2017-07-01 MED ORDER — LIDOCAINE-EPINEPHRINE (PF) 2 %-1:200000 IJ SOLN
INTRAMUSCULAR | Status: AC
  Filled 2017-07-01: qty 20

## 2017-07-01 MED ORDER — FENTANYL CITRATE (PF) 100 MCG/2ML IJ SOLN
INTRAMUSCULAR | Status: AC
  Filled 2017-07-01: qty 4

## 2017-07-01 MED ORDER — SODIUM CHLORIDE 0.9% FLUSH: 3.0000 mL | INTRAVENOUS | Status: DC | PRN

## 2017-07-01 MED ORDER — ONDANSETRON HCL 4 MG/2ML IJ SOLN: 4.0000 mg | Freq: Four times a day (QID) | INTRAMUSCULAR | Status: DC | PRN

## 2017-07-01 MED ORDER — ATORVASTATIN CALCIUM 10 MG PO TABS: 10.0000 mg | ORAL_TABLET | Freq: Every day | ORAL | 11 refills | Status: DC

## 2017-07-01 MED ORDER — FENTANYL CITRATE (PF) 100 MCG/2ML IJ SOLN
INTRAMUSCULAR | Status: DC | PRN
Start: 2017-07-01 — End: 2017-07-01
  Administered 2017-07-01 (×2): 25 ug via INTRAVENOUS
  Administered 2017-07-01: 50 ug via INTRAVENOUS
  Administered 2017-07-01 (×4): 25 ug via INTRAVENOUS

## 2017-07-01 MED ORDER — SODIUM CHLORIDE 0.9 % IV SOLN: 250.0000 mL | INTRAVENOUS | Status: DC | PRN

## 2017-07-01 MED ORDER — HYDRALAZINE HCL 20 MG/ML IJ SOLN: 5.0000 mg | INTRAMUSCULAR | Status: DC | PRN

## 2017-07-01 MED ORDER — SODIUM CHLORIDE 0.9 % IV SOLN: INTRAVENOUS | Status: DC

## 2017-07-01 MED ORDER — HEPARIN SODIUM (PORCINE) 1000 UNIT/ML IJ SOLN
INTRAMUSCULAR | Status: AC
  Filled 2017-07-01: qty 1

## 2017-07-01 MED ORDER — ATORVASTATIN CALCIUM 20 MG PO TABS
20.0000 mg | ORAL_TABLET | Freq: Every day | ORAL | Status: DC
  Filled 2017-07-01: qty 1

## 2017-07-01 MED ORDER — MIDAZOLAM HCL 5 MG/5ML IJ SOLN
INTRAMUSCULAR | Status: AC
  Filled 2017-07-01: qty 10

## 2017-07-01 MED ORDER — CLOPIDOGREL BISULFATE 75 MG PO TABS: 75.0000 mg | ORAL_TABLET | Freq: Every day | ORAL | 11 refills | Status: DC

## 2017-07-01 MED ORDER — CLOPIDOGREL BISULFATE 75 MG PO TABS: 75.0000 mg | ORAL_TABLET | Freq: Every day | ORAL | Status: DC

## 2017-07-01 MED ORDER — IOPAMIDOL (ISOVUE-300) INJECTION 61%
INTRAVENOUS | Status: DC | PRN
  Administered 2017-07-01: 145 mL via INTRA_ARTERIAL

## 2017-07-01 MED ORDER — SODIUM CHLORIDE 0.9 % IV SOLN
INTRAVENOUS | Status: DC
  Administered 2017-07-01: 09:00:00 via INTRAVENOUS

## 2017-07-01 MED ORDER — SODIUM CHLORIDE 0.9% FLUSH: 3.0000 mL | Freq: Two times a day (BID) | INTRAVENOUS | Status: DC

## 2017-07-01 MED ORDER — HEPARIN (PORCINE) IN NACL 2-0.9 UNIT/ML-% IJ SOLN
INTRAMUSCULAR | Status: AC
  Filled 2017-07-01: qty 1000

## 2017-07-01 MED ORDER — METHYLPREDNISOLONE SODIUM SUCC 125 MG IJ SOLR: 125.0000 mg | INTRAMUSCULAR | Status: DC | PRN

## 2017-07-01 MED ORDER — FAMOTIDINE 20 MG PO TABS: 40.0000 mg | ORAL_TABLET | ORAL | Status: DC | PRN

## 2017-07-01 MED ORDER — SODIUM CHLORIDE 0.9 % IV SOLN
Freq: Once | INTRAVENOUS | Status: AC
  Administered 2017-07-01: 09:00:00 via INTRAVENOUS

## 2017-07-01 MED ORDER — CEFAZOLIN SODIUM-DEXTROSE 2-4 GM/100ML-% IV SOLN
INTRAVENOUS | Status: AC
  Filled 2017-07-01: qty 100

## 2017-07-01 MED ORDER — LABETALOL HCL 5 MG/ML IV SOLN: 10.0000 mg | INTRAVENOUS | Status: DC | PRN

## 2017-07-01 MED ORDER — HYDROMORPHONE HCL 1 MG/ML IJ SOLN: 1.0000 mg | Freq: Once | INTRAMUSCULAR | Status: DC | PRN

## 2017-07-01 SURGICAL SUPPLY — 34 items
BALLN LUTONIX 4X220X130 (BALLOONS) ×3
BALLN LUTONIX 5X220X130 (BALLOONS) ×3
BALLN LUTONIX AV 8X60X75 (BALLOONS) ×3
BALLN LUTONIX DCB 7X60X130 (BALLOONS) ×3
BALLN ULTRVRSE 2.5X300X150 (BALLOONS) ×3
BALLN ULTRVRSE 8X40X75C (BALLOONS) ×3
BALLOON LUTONIX 4X220X130 (BALLOONS) ×1 IMPLANT
BALLOON LUTONIX 5X220X130 (BALLOONS) ×1 IMPLANT
BALLOON LUTONIX AV 8X60X75 (BALLOONS) ×1 IMPLANT
BALLOON LUTONIX DCB 7X60X130 (BALLOONS) ×1 IMPLANT
BALLOON ULTRVRSE 2.5X300X150 (BALLOONS) ×1 IMPLANT
BALLOON ULTRVRSE 8X40X75C (BALLOONS) ×1 IMPLANT
CATH BEACON 5 .035 40 KMP TP (CATHETERS) ×1 IMPLANT
CATH BEACON 5 .035 65 KMP TIP (CATHETERS) ×3 IMPLANT
CATH BEACON 5 .038 100 VERT TP (CATHETERS) ×3 IMPLANT
CATH BEACON 5 .038 40 KMP TP (CATHETERS) ×2
CATH CXI 4F 90 DAV (CATHETERS) ×3 IMPLANT
CATH CXI SUPP ANG 4FR 135 (MICROCATHETER) ×1 IMPLANT
CATH CXI SUPP ANG 4FR 135CM (MICROCATHETER) ×3
CATH PIG 70CM (CATHETERS) ×3 IMPLANT
DEVICE PRESTO INFLATION (MISCELLANEOUS) ×3 IMPLANT
DEVICE STARCLOSE SE CLOSURE (Vascular Products) ×3 IMPLANT
DEVICE TORQUE (MISCELLANEOUS) ×3 IMPLANT
GLIDEWIRE STIFF .35X180X3 HYDR (WIRE) ×3 IMPLANT
GUIDEWIRE PFTE-COATED .018X300 (WIRE) ×3 IMPLANT
PACK ANGIOGRAPHY (CUSTOM PROCEDURE TRAY) ×3 IMPLANT
SHEATH ANL2 6FRX45 HC (SHEATH) ×6 IMPLANT
SHEATH BRITE TIP 5FRX11 (SHEATH) ×6 IMPLANT
STENT LIFESTAR 9X30 (Permanent Stent) ×3 IMPLANT
SYR MEDRAD MARK V 150ML (SYRINGE) ×3 IMPLANT
TUBING CONTRAST HIGH PRESS 72 (TUBING) ×3 IMPLANT
WIRE G V18X300CM (WIRE) ×12 IMPLANT
WIRE J 3MM .035X145CM (WIRE) ×3 IMPLANT
WIRE MAGIC TORQUE 260C (WIRE) ×3 IMPLANT

## 2017-07-01 NOTE — Progress Notes (Signed)
Patient remains clinically stable post procedure. Dr Lucky Cowboy out to speak with patient and family earlier post procedure with questions answered, denies complaints at this time. Vitals have remained stable. No bleeding nor hematoma at left groin site, dressing dry and intact. Discharge teaching given to wife and patient with questions answered, return appointment given. Taking po's with no difficulty.

## 2017-07-01 NOTE — Discharge Instructions (Signed)

## 2017-07-01 NOTE — Op Note (Signed)
[facility] Percutaneous Study/Intervention Procedural Note   Date of Surgery: 07/01/2017  Surgeon(s):DEW,JASON   Assistants:none  Pre-operative Diagnosis: PAD with ulceration RLE  Post-operative diagnosis: Same  Procedure(s) Performed: 1. Ultrasound guidance for vascular access left femoral artery 2. Catheter placement into right peroneal artery from left femoral approach 3. Aortogram and selective right lower extremity angiogram 4. Percutaneous transluminal angioplasty of left proximal external iliac artery with 7 mm diameter by 6 cm length Lutonix drug-coated angioplasty balloon 5. Percutaneous transluminal angioplasty of right common iliac artery with 62m diameter by 6 cm length Lutonix drug-coated angioplasty balloon  6.  percutaneous transluminal angioplasty of right peroneal artery and tibioperoneal trunk with 2.5 mm diameter by 30 cm length angioplasty balloon 7. percutaneous transluminal angioplasty of the right popliteal artery and entire SFA with 4 mm diameter by 22 cm length Lutonix drug-coated angioplasty balloon distally and 5 mm diameter by 22 cm length Lutonix drug-coated angioplasty balloon proximally  8.  Self-expanding stent placement to left distal common and proximal external iliac artery for greater than 50% residual stenosis after angioplasty using a 9 mm diameter by 4 cm length life Star stent  9.  StarClose closure device left femoral artery  EBL: minimal  Contrast: 145 cc  Fluoro Time: 24 minutes  Moderate Conscious Sedation Time: approximately 90 minutes using 7 mg of Versed and 200 mcg of Fentanyl  Indications: Patient is a 66y.o.male with pain and a nonhealing ulceration of the right foot. The patient has noninvasive study showing markedly reduced right ABI of 0.3 and moderately reduced left ABI of 0.7. The patient is brought in for  angiography for further evaluation and potential treatment. Risks and benefits are discussed and informed consent is obtained  Procedure: The patient was identified and appropriate procedural time out was performed. The patient was then placed supine on the table and prepped and draped in the usual sterile fashion.Moderate conscious sedation was administered during a face to face encounter with the patient throughout the procedure with my supervision of the RN administering medicines and monitoring the patient's vital signs, pulse oximetry, telemetry and mental status throughout from the start of the procedure until the patient was taken to the recovery room. Ultrasound was used to evaluate the left common femoral artery. It was patent . A digital ultrasound image was acquired. A Seldinger needle was used to access the left common femoral artery under direct ultrasound guidance and a permanent image was performed. A 0.035 J wire was advanced without resistance and a 5Fr sheath was placed. Pigtail catheter was placed into the aorta and an AP aortogram was performed. This demonstrated normal renal arteries and a calcific aorta without stenosis.  The left proximal external iliac artery had about an 80% stenosis.  The right common iliac artery had about a 60% stenosis. The hypogastric arteries were small and diseased bilaterally. I then crossed the aortic bifurcation and advanced to the right femoral head. Selective right lower extremity angiogram was then performed. This demonstrated near flush right SFA occlusion with what appeared to be reconstitution of a diseased above-knee popliteal artery. The popliteal artery then occluded again distally and there was faint reconstitution of a small diseased posterior tibial artery that did not appear continuous into the foot but appeared to be the best runoff. The patient was systemically heparinized and I started by treating the left iliac artery stenosis before  putting in Up & Over sheath then. A 7 mm diameter by 6 cm length Lutonix  drug-coated angioplasty balloon was selected and inflated to 14 atm for 1 minute. Completion angiogram still showed a greater than 50% residual stenosis, and stent would be placed after attention to the right leg prior to completion. At this point, a 6 [place] Ansell sheath was then placed over the Glide wire. I then used a Kumpe catheter and the glide wire to navigate into the SFA. This was extremely tedious and multiple different wires and catheters including the CXI catheter, 0.018 advantage wire, the 18 wire, and stiff angle Glidewire were used to try to cross the occlusion. I was able to get into the popliteal artery and confirm intraluminal flow within getting into the tibials also proved quite difficult. The CXI catheter and ultimately a V 18 wire were used to get down into the tibioperoneal trunk. I was able to get into the peroneal artery get all the way into the branches of the peroneal artery at the ankle although the flow was not continuous to the foot. Despite multiple attempts, attempts at getting into the posterior tibial artery were unsuccessful. I decided to treat the peroneal artery, SFA, and popliteal arteries. A 2.5 mm diameter by 30 cm length angioplasty balloon was then selected for the peroneal artery and tibioperoneal trunk. This was inflated to 14 atm for 1 minute from just above the ankle up to the tibioperoneal trunk. A 4 mm diameter by 22 cm length Lutonix drug-coated angioplasty balloon was used to treat the popliteal artery and distal SFA. This was inflated to 14 atm for 1 minute as well. A 5 mm diameter by 22 cm length Lutonix drug-coated angioplasty balloon was used to treat the remainder of the SFA up to its origin. This was inflated to 12 atm for 1 minute. Completion angiogram showed very sluggish flow distally. There were dissections with some areas of borderline 50% stenosis in the SFA and popliteal arteries,  but the main issue is the poor runoff distally. The peroneal artery just was not continuous and although we performed angioplasty this chronically occluded vessel showed minimal flow. Further attempts were made to get into the posterior tibial artery but these were unsuccessful. I treated the right common iliac artery moderate lesion with an 8 mm diameter by 6 cm length Lutonix drug-coated angioplasty balloon after backing the sheath up to the aortic bifurcation. Inflation was taken to 10 atm for 1 minute. Completion angiogram showed about a 30% residual stenosis. The sheath was then pulled back to the left external iliac artery and a 9 mm diameter by 4 cm length life Star self-expanding stent was deployed from the distal left common iliac artery down across the lesion and into the external iliac artery. This was postdilated with an 8 mm balloon with excellent angiographic completion result and only about 10% residual stenosis. I elected to terminate the procedure. The sheath was removed and StarClose closure device was deployed in the left femoral artery with excellent hemostatic result. The patient was taken to the recovery room in stable condition having tolerated the procedure well.  Findings:  Aortogram: This demonstrated normal renal arteries and a calcific aorta without stenosis.  The left proximal external iliac artery had about an 80% stenosis.  The right common iliac artery had about a 60% stenosis. The hypogastric arteries were small and diseased bilaterally. Right Lower Extremity: near flush right SFA occlusion with what appeared to be reconstitution of a diseased above-knee popliteal artery. The popliteal artery then occluded again distally and there was faint reconstitution of a  small diseased posterior tibial artery that did not appear continuous into the foot but appeared to be the best runoff.   Disposition: Patient was taken to the recovery room in stable condition  having tolerated the procedure well.  Complications: None  [name] 07/01/2017 12:49 PM   This note was created with Dragon Medical transcription system. Any errors in dictation are purely unintentional.

## 2017-07-01 NOTE — H&P (Signed)
[facility] History & Physical Update  The patient was interviewed and re-examined.  The patient's previous History and Physical has been reviewed and is unchanged.  There is no change in the plan of care. We plan to proceed with the scheduled procedure.  [name], MD  07/01/2017, 10:33 AM

## 2017-07-02 ENCOUNTER — Encounter: Payer: Self-pay | Admitting: Vascular Surgery

## 2017-07-10 DIAGNOSIS — F17200 Nicotine dependence, unspecified, uncomplicated: Secondary | ICD-10-CM | POA: Diagnosis not present

## 2017-07-10 DIAGNOSIS — I96 Gangrene, not elsewhere classified: Secondary | ICD-10-CM | POA: Diagnosis not present

## 2017-07-10 DIAGNOSIS — Z01818 Encounter for other preprocedural examination: Secondary | ICD-10-CM | POA: Diagnosis not present

## 2017-07-10 DIAGNOSIS — I739 Peripheral vascular disease, unspecified: Secondary | ICD-10-CM | POA: Diagnosis not present

## 2017-07-10 DIAGNOSIS — I119 Hypertensive heart disease without heart failure: Secondary | ICD-10-CM | POA: Diagnosis not present

## 2017-07-10 DIAGNOSIS — L97513 Non-pressure chronic ulcer of other part of right foot with necrosis of muscle: Secondary | ICD-10-CM | POA: Diagnosis not present

## 2017-07-10 DIAGNOSIS — Z789 Other specified health status: Secondary | ICD-10-CM | POA: Diagnosis not present

## 2017-07-15 ENCOUNTER — Other Ambulatory Visit: Payer: Self-pay | Admitting: Podiatry

## 2017-07-15 ENCOUNTER — Encounter
Admission: RE | Admit: 2017-07-15 | Discharge: 2017-07-15 | Disposition: A | Discharge status: Home / Self Care | Payer: Medicare Other | Source: Ambulatory Visit | Attending: Podiatry | Admitting: Podiatry

## 2017-07-15 DIAGNOSIS — L97513 Non-pressure chronic ulcer of other part of right foot with necrosis of muscle: Secondary | ICD-10-CM | POA: Insufficient documentation

## 2017-07-15 DIAGNOSIS — Z01818 Encounter for other preprocedural examination: Secondary | ICD-10-CM | POA: Insufficient documentation

## 2017-07-15 HISTORY — DX: Peripheral vascular disease, unspecified: I73.9

## 2017-07-15 HISTORY — DX: Hyperlipidemia, unspecified: E78.5

## 2017-07-15 LAB — BASIC METABOLIC PANEL
Anion gap: 10 (ref 5–15)
BUN: 23 mg/dL — AB (ref 6–20)
CALCIUM: 9.7 mg/dL (ref 8.9–10.3)
CHLORIDE: 105 mmol/L (ref 101–111)
CO2: 26 mmol/L (ref 22–32)
CREATININE: 1.39 mg/dL — AB (ref 0.61–1.24)
GFR calc non Af Amer: 51 mL/min — ABNORMAL LOW (ref >60)
GFR, EST AFRICAN AMERICAN: 59 mL/min — AB (ref >60)
Glucose, Bld: 96 mg/dL (ref 65–99)
Potassium: 4.4 mmol/L (ref 3.5–5.1)
SODIUM: 141 mmol/L (ref 135–145)

## 2017-07-15 LAB — CBC
HCT: 33.7 % — ABNORMAL LOW (ref 40.0–52.0)
Hemoglobin: 11.1 g/dL — ABNORMAL LOW (ref 13.0–18.0)
MCH: 28.8 pg (ref 26.0–34.0)
MCHC: 33 g/dL (ref 32.0–36.0)
MCV: 87.1 fL (ref 80.0–100.0)
Platelets: 312 10*3/uL (ref 150–440)
RBC: 3.87 MIL/uL — ABNORMAL LOW (ref 4.40–5.90)
RDW: 14.6 % — AB (ref 11.5–14.5)
WBC: 11.7 10*3/uL — ABNORMAL HIGH (ref 3.8–10.6)

## 2017-07-15 NOTE — Patient Instructions (Signed)
Your procedure is scheduled on: Friday 07/19/17 Report to [place]. 2ND FLOOR MEDICAL MALL ENTRANCE. To find out your arrival time please call [phone] between 1PM - 3PM on Thursday 07/18/17.  Remember: Instructions that are not followed completely may result in serious medical risk, up to and including death, or upon the discretion of your surgeon and anesthesiologist your surgery may need to be rescheduled.    __X__ 1. Do not eat anything after midnight the night before your    procedure.  No gum chewing or hard candies.  You may drink clear   liquids up to 2 hours before you are scheduled to arrive at the   hospital for your procedure. Do not drink clear liquids within 2   hours of scheduled arrival to the hospital as this may lead to your   procedure being delayed or rescheduled.       Clear liquids include:   Water or Apple juice without pulp   Clear carbohydrate beverage such as Clearfast or Gatorade   Black coffee or Clear Tea (no milk, no creamer, do not add anything   to the coffee or tea)    Diabetics should only drink water   __X__ 2. No Alcohol for 24 hours before or after surgery.   ____ 3. Bring all medications with you on the day of surgery if instructed.    __X__ 4. Notify your doctor if there is any change in your medical condition     (cold, fever, infections).             __X___5. No smoking within 24 hours of your surgery.     Do not wear jewelry, make-up, hairpins, clips or nail polish.  Do not wear lotions, powders, or perfumes.   Do not shave 48 hours prior to surgery. Men may shave face and neck.  Do not bring valuables to the hospital.    [facility] is not responsible for any belongings or valuables.               Contacts, dentures or bridgework may not be worn into surgery.  Leave your suitcase in the car. After surgery it may be brought to your room.  For patients admitted to the hospital, discharge time is determined by your                 treatment team.   Patients discharged the day of surgery will not be allowed to drive home.   Please read over the following fact sheets that you were given:   MRSA Information   __x__ Take these medicines the morning of surgery with A SIP OF WATER:    1. amlodipine  2. atorvastatin  3. metoprolol  4.  5.  6.  ____ Fleet Enema (as directed)   __x__ Use CHG Soap/SAGE wipes as directed  ____ Use inhalers on the day of surgery  ____ Stop metformin 2 days prior to surgery    ____ Take 1/2 of usual insulin dose the night before surgery and none on the morning of surgery.   __x__ Stop Coumadin/Plavix/aspirin on followup with Dr Cline/Dr Delana Meyer regarding Plavix  __X__ Stop Anti-inflammatories such as Advil, Aleve, Ibuprofen, Motrin, Naproxen, Naprosyn, Goodies,powder, or aspirin products.  OK to take Tylenol.   __X__ Stop supplements, Vitamin E, Fish Oil until after surgery.    ____ Bring C-Pap to the hospital.

## 2017-07-16 ENCOUNTER — Ambulatory Visit (INDEPENDENT_AMBULATORY_CARE_PROVIDER_SITE_OTHER): Payer: Medicare Other

## 2017-07-16 ENCOUNTER — Encounter (INDEPENDENT_AMBULATORY_CARE_PROVIDER_SITE_OTHER): Payer: Self-pay | Admitting: Vascular Surgery

## 2017-07-16 ENCOUNTER — Ambulatory Visit (INDEPENDENT_AMBULATORY_CARE_PROVIDER_SITE_OTHER): Payer: Medicare Other | Admitting: Vascular Surgery

## 2017-07-16 ENCOUNTER — Other Ambulatory Visit (INDEPENDENT_AMBULATORY_CARE_PROVIDER_SITE_OTHER): Payer: Self-pay | Admitting: Vascular Surgery

## 2017-07-16 VITALS — BP 153/81 | HR 68 | Resp 16 | Wt 133.0 lb

## 2017-07-16 DIAGNOSIS — I739 Peripheral vascular disease, unspecified: Secondary | ICD-10-CM

## 2017-07-16 DIAGNOSIS — I7025 Atherosclerosis of native arteries of other extremities with ulceration: Secondary | ICD-10-CM

## 2017-07-16 DIAGNOSIS — I10 Essential (primary) hypertension: Secondary | ICD-10-CM

## 2017-07-16 DIAGNOSIS — E785 Hyperlipidemia, unspecified: Secondary | ICD-10-CM

## 2017-07-16 NOTE — Assessment & Plan Note (Signed)
His noninvasive studies today showed noncompressible vessels on the right but his digital waveform is outstanding. The posterior tibial is monophasic and the anterior tibial is absent, but his only runoff at the time the procedure was the peroneal artery so this is not surprising. It is encouraging to see such brisk flow on his noninvasive studies. His left ABI 0.90 but is likely falsely elevated from medial calcification as his waveforms are poor and digital pressure is not really detected. At this point, he has progressed to gangrenous changes of his right fifth toe and this will be removed later this week. Now would be a good time to remove this as his blood flow is as good as we can make it. I have discussed the long-term evaluation of his perfusion. His left leg has fairly concerning perfusion as well, so avoidance of ulceration or pressure would be of paramount importance. I'll see him back in 2-3 months. He should continue his Plavix and Lipitor.

## 2017-07-16 NOTE — Progress Notes (Signed)
MRN : [id number]  Jordan Mills is a 66 y.o. (April 30, 1951) male who presents with chief complaint of  Chief Complaint  Patient presents with  . Follow-up    2wk abi  .  History of Present Illness: Patient returns in follow up for PAD. He has had a fair bit of reperfusion swelling and he still has gangrene of the right fifth toe and this is quite painful to him. He is having difficulty walking or putting pressure on the foot. He is actually scheduled to have his right fifth toe removed by his podiatrist later this week. He is having a hard time with the pain as well as some swelling in both lower extremities worse on the right and left. His feet are dependent much of the time and he is not doing much walking. He denies fever or chills. His access site is well-healed. His noninvasive studies today showed noncompressible vessels on the right but his digital waveform is outstanding. The posterior tibial is monophasic and the anterior tibial is absent, but his only runoff at the time the procedure was the peroneal artery so this is not surprising. It is encouraging to see such brisk flow on his noninvasive studies. His left ABI 0.90 but is likely falsely elevated from medial calcification as his waveforms are poor and digital pressure is not really detected. He does not currently have any ulceration or infection on the left leg.    Past Medical History:  Diagnosis Date  . Gout   . HLD (hyperlipidemia)   . Hypertension   . Peripheral vascular disease [facility])     Past Surgical History:  Procedure Laterality Date  . LOWER EXTREMITY ANGIOGRAPHY Right 07/01/2017   Procedure: Lower Extremity Angiography;  Surgeon: Algernon Huxley, MD;  Location: [facility];  Service: Cardiovascular;  Laterality: Right;  . TONSILLECTOMY      Social History Social History  Substance Use Topics  . Smoking status: Current Every Day Smoker    Packs/day: 0.20    Types: Cigarettes  . Smokeless tobacco:  Never Used  . Alcohol use No    Family History Family History  Problem Relation Age of Onset  . Hypertension Mother   . Hyperlipidemia Mother   . Diabetes Mother   . Stroke Mother   . Lung cancer Sister     Current Outpatient Prescriptions  Medication Sig Dispense Refill  . amLODipine (NORVASC) 5 MG tablet Take 5 mg by mouth 2 (two) times daily.  10  . amoxicillin-clavulanate (AUGMENTIN) 875-125 MG tablet Take 1 tablet by mouth 2 (two) times daily.     Marland Kitchen atorvastatin (LIPITOR) 10 MG tablet Take 1 tablet (10 mg total) by mouth daily. 30 tablet 11  . clopidogrel (PLAVIX) 75 MG tablet Take 1 tablet (75 mg total) by mouth daily. 30 tablet 11  . HYDROcodone-acetaminophen (NORCO/VICODIN) 5-325 MG tablet Take 1 tablet by mouth every 6 (six) hours as needed for severe pain.     Marland Kitchen lisinopril-hydrochlorothiazide (PRINZIDE,ZESTORETIC) 20-12.5 MG tablet Take 2 tablets by mouth daily.  5  . metoprolol (LOPRESSOR) 50 MG tablet Take 50 mg by mouth 2 (two) times daily.  4   No current facility-administered medications for this visit.     No Known Allergies    REVIEW OF SYSTEMS (Negative unless checked)  Constitutional: '[]' Weight loss  '[]' Fever  '[]' Chills Cardiac: '[]' Chest pain   '[]' Chest pressure   '[]' Palpitations   '[]' Shortness of breath when laying flat   '[]' Shortness of  breath at rest   '[]' Shortness of breath with exertion. Vascular:  '[x]' Pain in legs with walking   '[]' Pain in legs at rest   '[]' Pain in legs when laying flat   '[x]' Claudication   '[]' Pain in feet when walking  '[x]' Pain in feet at rest  '[]' Pain in feet when laying flat   '[]' History of DVT   '[]' Phlebitis   '[]' Swelling in legs   '[]' Varicose veins   '[x]' Non-healing ulcers Pulmonary:   '[]' Uses home oxygen   '[]' Productive cough   '[]' Hemoptysis   '[]' Wheeze  '[]' COPD   '[]' Asthma Neurologic:  '[]' Dizziness  '[]' Blackouts   '[]' Seizures   '[]' History of stroke   '[]' History of TIA  '[]' Aphasia   '[]' Temporary blindness   '[]' Dysphagia   '[]' Weakness or numbness in arms   '[]' Weakness  or numbness in legs Musculoskeletal:  '[]' Arthritis   '[]' Joint swelling   '[]' Joint pain   '[]' Low back pain Hematologic:  '[]' Easy bruising  '[]' Easy bleeding   '[]' Hypercoagulable state   '[]' Anemic  '[]' Hepatitis Gastrointestinal:  '[]' Blood in stool   '[]' Vomiting blood  '[]' Gastroesophageal reflux/heartburn   '[]' Abdominal pain Genitourinary:  '[]' Chronic kidney disease   '[]' Difficult urination  '[]' Frequent urination  '[]' Burning with urination   '[]' Hematuria Skin:  '[]' Rashes   '[x]' Ulcers   '[x]' Wounds Psychological:  '[]' History of anxiety   '[]'  History of major depression.   Physical Examination  Vitals:   07/16/17 1540  BP: (!) 153/81  Pulse: 68  Resp: 16  Weight: 60.3 kg (133 lb)   Body mass index is 20.83 kg/m. Gen:  WD/WN, NAD, appears older than stated age Head: Paint/AT, No temporalis wasting. Ear/Nose/Throat: Hearing grossly intact, dentition poor, trachea midline Eyes: Conjunctiva clear. Sclera non-icteric Neck: Supple.  No JVD. Trachea midline Pulmonary:  Good air movement, respirations not labored, no use of accessory muscles.  Cardiac: RRR, normal S1, S2. Vascular:  Vessel Right Left  Radial Palpable Palpable                          PT 1+ 1+  DP Not palpable Not palpable    Musculoskeletal: M/S 5/5 throughout.  No deformity or atrophy. 1-2+ BLE edema. Right fifth toe with gangrenous changes Neurologic: Sensation grossly intact in extremities.  Symmetrical.  Speech is fluent. Psychiatric: Judgment intact, Mood & affect appropriate for pt's clinical situation. Dermatologic: right fifth toe with gangrenous changes     Labs Recent Results (from the past 2160 hour(s))  Basic metabolic panel     Status: Abnormal   Collection Time: 05/22/17  1:02 PM  Result Value Ref Range   Sodium 136 135 - 145 mmol/L   Potassium 3.8 3.5 - 5.1 mmol/L   Chloride 104 101 - 111 mmol/L   CO2 23 22 - 32 mmol/L   Glucose, Bld 101 (H) 65 - 99 mg/dL   BUN 52 (H) 6 - 20 mg/dL   Creatinine, Ser 2.48 (H) 0.61 -  1.24 mg/dL   Calcium 9.6 8.9 - 10.3 mg/dL   GFR calc non Af Amer 25 (L) >60 mL/min   GFR calc Af Amer 29 (L) >60 mL/min    Comment: (NOTE) The eGFR has been calculated using the CKD EPI equation. This calculation has not been validated in all clinical situations. eGFR's persistently <60 mL/min signify possible Chronic Kidney Disease.    Anion gap 9 5 - 15  CBC     Status: Abnormal   Collection Time: 05/22/17  1:02 PM  Result Value Ref Range  WBC 18.3 (H) 3.8 - 10.6 K/uL   RBC 4.39 (L) 4.40 - 5.90 MIL/uL   Hemoglobin 12.3 (L) 13.0 - 18.0 g/dL   HCT 37.4 (L) 40.0 - 52.0 %   MCV 85.4 80.0 - 100.0 fL   MCH 28.0 26.0 - 34.0 pg   MCHC 32.8 32.0 - 36.0 g/dL   RDW 14.7 (H) 11.5 - 14.5 %   Platelets 306 150 - 440 K/uL  Troponin I     Status: None   Collection Time: 05/22/17  1:02 PM  Result Value Ref Range   Troponin I <0.03 <0.03 ng/mL  Troponin I     Status: None   Collection Time: 05/22/17  7:01 PM  Result Value Ref Range   Troponin I <0.03 <0.03 ng/mL  BUN     Status: Abnormal   Collection Time: 07/01/17  7:38 AM  Result Value Ref Range   BUN 28 (H) 6 - 20 mg/dL  Creatinine, serum     Status: Abnormal   Collection Time: 07/01/17  7:38 AM  Result Value Ref Range   Creatinine, Ser 1.66 (H) 0.61 - 1.24 mg/dL   GFR calc non Af Amer 41 (L) >60 mL/min   GFR calc Af Amer 47 (L) >60 mL/min    Comment: (NOTE) The eGFR has been calculated using the CKD EPI equation. This calculation has not been validated in all clinical situations. eGFR's persistently <60 mL/min signify possible Chronic Kidney Disease.   CBC     Status: Abnormal   Collection Time: 07/15/17 10:54 AM  Result Value Ref Range   WBC 11.7 (H) 3.8 - 10.6 K/uL   RBC 3.87 (L) 4.40 - 5.90 MIL/uL   Hemoglobin 11.1 (L) 13.0 - 18.0 g/dL   HCT 33.7 (L) 40.0 - 52.0 %   MCV 87.1 80.0 - 100.0 fL   MCH 28.8 26.0 - 34.0 pg   MCHC 33.0 32.0 - 36.0 g/dL   RDW 14.6 (H) 11.5 - 14.5 %   Platelets 312 150 - 440 K/uL  Basic  metabolic panel     Status: Abnormal   Collection Time: 07/15/17 10:54 AM  Result Value Ref Range   Sodium 141 135 - 145 mmol/L   Potassium 4.4 3.5 - 5.1 mmol/L   Chloride 105 101 - 111 mmol/L   CO2 26 22 - 32 mmol/L   Glucose, Bld 96 65 - 99 mg/dL   BUN 23 (H) 6 - 20 mg/dL   Creatinine, Ser 1.39 (H) 0.61 - 1.24 mg/dL   Calcium 9.7 8.9 - 10.3 mg/dL   GFR calc non Af Amer 51 (L) >60 mL/min   GFR calc Af Amer 59 (L) >60 mL/min    Comment: (NOTE) The eGFR has been calculated using the CKD EPI equation. This calculation has not been validated in all clinical situations. eGFR's persistently <60 mL/min signify possible Chronic Kidney Disease.    Anion gap 10 5 - 15    Radiology No results found.   Assessment/Plan  HLD (hyperlipidemia) lipid control important in reducing the progression of atherosclerotic disease. Continue statin therapy   Hypertension blood pressure control important in reducing the progression of atherosclerotic disease. On appropriate oral medications.   Atherosclerosis of native arteries of the extremities with ulceration ([place]) His noninvasive studies today showed noncompressible vessels on the right but his digital waveform is outstanding. The posterior tibial is monophasic and the anterior tibial is absent, but his only runoff at the time the procedure was the peroneal artery so  this is not surprising. It is encouraging to see such brisk flow on his noninvasive studies. His left ABI 0.90 but is likely falsely elevated from medial calcification as his waveforms are poor and digital pressure is not really detected. At this point, he has progressed to gangrenous changes of his right fifth toe and this will be removed later this week. Now would be a good time to remove this as his blood flow is as good as we can make it. I have discussed the long-term evaluation of his perfusion. His left leg has fairly concerning perfusion as well, so avoidance of ulceration or  pressure would be of paramount importance. I'll see him back in 2-3 months. He should continue his Plavix and Lipitor.    [name], MD  07/16/2017 4:19 PM    This note was created with Dragon medical transcription system.  Any errors from dictation are purely unintentional

## 2017-07-16 NOTE — Assessment & Plan Note (Signed)
blood pressure control important in reducing the progression of atherosclerotic disease. On appropriate oral medications.  

## 2017-07-16 NOTE — Patient Instructions (Signed)

## 2017-07-16 NOTE — Assessment & Plan Note (Signed)
lipid control important in reducing the progression of atherosclerotic disease. Continue statin therapy  

## 2017-07-18 MED ORDER — CEFAZOLIN SODIUM-DEXTROSE 2-4 GM/100ML-% IV SOLN
2.0000 g | INTRAVENOUS | Status: AC
  Administered 2017-07-19: 2 g via INTRAVENOUS

## 2017-07-19 ENCOUNTER — Encounter: Payer: Self-pay | Admitting: *Deleted

## 2017-07-19 ENCOUNTER — Ambulatory Visit
Admission: RE | Admit: 2017-07-19 | Discharge: 2017-07-19 | Disposition: A | Discharge status: Home / Self Care | Payer: Medicare Other | Source: Ambulatory Visit | Attending: Podiatry | Admitting: Podiatry

## 2017-07-19 ENCOUNTER — Ambulatory Visit: Payer: Medicare Other | Admitting: Certified Registered Nurse Anesthetist

## 2017-07-19 ENCOUNTER — Ambulatory Visit (INDEPENDENT_AMBULATORY_CARE_PROVIDER_SITE_OTHER): Payer: Medicare Other | Admitting: Vascular Surgery

## 2017-07-19 ENCOUNTER — Encounter (INDEPENDENT_AMBULATORY_CARE_PROVIDER_SITE_OTHER): Payer: Medicare Other

## 2017-07-19 ENCOUNTER — Encounter
Admission: RE | Disposition: A | Discharge status: Home / Self Care | Payer: Self-pay | Source: Ambulatory Visit | Attending: Podiatry

## 2017-07-19 DIAGNOSIS — I10 Essential (primary) hypertension: Secondary | ICD-10-CM | POA: Diagnosis not present

## 2017-07-19 DIAGNOSIS — I96 Gangrene, not elsewhere classified: Secondary | ICD-10-CM | POA: Diagnosis not present

## 2017-07-19 DIAGNOSIS — L97513 Non-pressure chronic ulcer of other part of right foot with necrosis of muscle: Secondary | ICD-10-CM | POA: Insufficient documentation

## 2017-07-19 DIAGNOSIS — F17200 Nicotine dependence, unspecified, uncomplicated: Secondary | ICD-10-CM | POA: Diagnosis not present

## 2017-07-19 DIAGNOSIS — I739 Peripheral vascular disease, unspecified: Secondary | ICD-10-CM | POA: Diagnosis not present

## 2017-07-19 HISTORY — PX: AMPUTATION TOE: SHX6595

## 2017-07-19 SURGERY — AMPUTATION, TOE
Anesthesia: General | Site: Toe | Laterality: Right | Wound class: Dirty or Infected

## 2017-07-19 MED ORDER — GLYCOPYRROLATE 0.2 MG/ML IJ SOLN
INTRAMUSCULAR | Status: AC
  Filled 2017-07-19: qty 1

## 2017-07-19 MED ORDER — MIDAZOLAM HCL 2 MG/2ML IJ SOLN
INTRAMUSCULAR | Status: DC | PRN
  Administered 2017-07-19 (×2): 1 mg via INTRAVENOUS

## 2017-07-19 MED ORDER — ACETAMINOPHEN 10 MG/ML IV SOLN
INTRAVENOUS | Status: DC | PRN
  Administered 2017-07-19: 1000 mg via INTRAVENOUS

## 2017-07-19 MED ORDER — AMOXICILLIN-POT CLAVULANATE 875-125 MG PO TABS: 1.0000 | ORAL_TABLET | Freq: Two times a day (BID) | ORAL | 0 refills | Status: AC

## 2017-07-19 MED ORDER — DEXAMETHASONE SODIUM PHOSPHATE 10 MG/ML IJ SOLN
INTRAMUSCULAR | Status: AC
  Filled 2017-07-19: qty 1

## 2017-07-19 MED ORDER — FENTANYL CITRATE (PF) 100 MCG/2ML IJ SOLN
INTRAMUSCULAR | Status: AC
Start: 2017-07-19 — End: 2017-07-19
  Filled 2017-07-19: qty 2

## 2017-07-19 MED ORDER — DEXAMETHASONE SODIUM PHOSPHATE 10 MG/ML IJ SOLN
INTRAMUSCULAR | Status: DC | PRN
  Administered 2017-07-19: 10 mg via INTRAVENOUS

## 2017-07-19 MED ORDER — PROPOFOL 10 MG/ML IV BOLUS
INTRAVENOUS | Status: DC | PRN
  Administered 2017-07-19: 120 mg via INTRAVENOUS

## 2017-07-19 MED ORDER — PROPOFOL 10 MG/ML IV BOLUS
INTRAVENOUS | Status: AC
  Filled 2017-07-19: qty 20

## 2017-07-19 MED ORDER — POVIDONE-IODINE 7.5 % EX SOLN
Freq: Once | CUTANEOUS | Status: DC
  Filled 2017-07-19: qty 118

## 2017-07-19 MED ORDER — LACTATED RINGERS IV SOLN
INTRAVENOUS | Status: DC
  Administered 2017-07-19: 09:00:00 via INTRAVENOUS

## 2017-07-19 MED ORDER — HYDROCODONE-ACETAMINOPHEN 5-325 MG PO TABS: 1.0000 | ORAL_TABLET | ORAL | 0 refills | Status: DC | PRN

## 2017-07-19 MED ORDER — CHLORHEXIDINE GLUCONATE 4 % EX LIQD
60.0000 mL | Freq: Once | CUTANEOUS | Status: AC
  Administered 2017-07-19: 4 via TOPICAL

## 2017-07-19 MED ORDER — GLYCOPYRROLATE 0.2 MG/ML IJ SOLN
INTRAMUSCULAR | Status: DC | PRN
  Administered 2017-07-19: 0.2 mg via INTRAVENOUS

## 2017-07-19 MED ORDER — FENTANYL CITRATE (PF) 100 MCG/2ML IJ SOLN: 25.0000 ug | INTRAMUSCULAR | Status: DC | PRN

## 2017-07-19 MED ORDER — LIDOCAINE HCL (PF) 2 % IJ SOLN
INTRAMUSCULAR | Status: AC
  Filled 2017-07-19: qty 4

## 2017-07-19 MED ORDER — ACETAMINOPHEN 10 MG/ML IV SOLN
INTRAVENOUS | Status: AC
  Filled 2017-07-19: qty 100

## 2017-07-19 MED ORDER — BUPIVACAINE HCL 0.5 % IJ SOLN
INTRAMUSCULAR | Status: DC | PRN
  Administered 2017-07-19: 9 mL

## 2017-07-19 MED ORDER — ONDANSETRON HCL 4 MG/2ML IJ SOLN
INTRAMUSCULAR | Status: AC
  Filled 2017-07-19: qty 2

## 2017-07-19 MED ORDER — ONDANSETRON HCL 4 MG/2ML IJ SOLN
INTRAMUSCULAR | Status: DC | PRN
  Administered 2017-07-19: 4 mg via INTRAVENOUS

## 2017-07-19 MED ORDER — CEFAZOLIN SODIUM-DEXTROSE 2-4 GM/100ML-% IV SOLN
INTRAVENOUS | Status: AC
  Filled 2017-07-19: qty 100

## 2017-07-19 MED ORDER — LIDOCAINE HCL (PF) 1 % IJ SOLN
INTRAMUSCULAR | Status: AC
  Filled 2017-07-19: qty 30

## 2017-07-19 MED ORDER — NEOMYCIN-POLYMYXIN B GU 40-200000 IR SOLN
Status: AC
  Filled 2017-07-19: qty 2

## 2017-07-19 MED ORDER — ONDANSETRON HCL 4 MG/2ML IJ SOLN: 4.0000 mg | Freq: Once | INTRAMUSCULAR | Status: DC | PRN

## 2017-07-19 MED ORDER — PHENYLEPHRINE HCL 10 MG/ML IJ SOLN
INTRAMUSCULAR | Status: DC | PRN
  Administered 2017-07-19 (×2): 100 ug via INTRAVENOUS

## 2017-07-19 MED ORDER — FAMOTIDINE 20 MG PO TABS
20.0000 mg | ORAL_TABLET | Freq: Once | ORAL | Status: AC
  Administered 2017-07-19: 20 mg via ORAL

## 2017-07-19 MED ORDER — MIDAZOLAM HCL 2 MG/2ML IJ SOLN
INTRAMUSCULAR | Status: AC
  Filled 2017-07-19: qty 2

## 2017-07-19 MED ORDER — BUPIVACAINE HCL (PF) 0.5 % IJ SOLN
INTRAMUSCULAR | Status: AC
  Filled 2017-07-19: qty 30

## 2017-07-19 MED ORDER — FENTANYL CITRATE (PF) 100 MCG/2ML IJ SOLN
INTRAMUSCULAR | Status: DC | PRN
  Administered 2017-07-19: 50 ug via INTRAVENOUS
  Administered 2017-07-19 (×2): 25 ug via INTRAVENOUS

## 2017-07-19 MED ORDER — FAMOTIDINE 20 MG PO TABS
ORAL_TABLET | ORAL | Status: AC
  Administered 2017-07-19: 20 mg via ORAL
  Filled 2017-07-19: qty 1

## 2017-07-19 MED ORDER — LIDOCAINE HCL (CARDIAC) 20 MG/ML IV SOLN
INTRAVENOUS | Status: DC | PRN
  Administered 2017-07-19: 80 mg via INTRAVENOUS

## 2017-07-19 SURGICAL SUPPLY — 44 items
BANDAGE ACE 4X5 VEL STRL LF (GAUZE/BANDAGES/DRESSINGS) ×3 IMPLANT
BLADE MED AGGRESSIVE (BLADE) ×3 IMPLANT
BLADE OSC/SAGITTAL MD 5.5X18 (BLADE) ×3 IMPLANT
BLADE SURG 15 STRL LF DISP TIS (BLADE) ×2 IMPLANT
BLADE SURG 15 STRL SS (BLADE) ×4
BLADE SURG MINI STRL (BLADE) ×3 IMPLANT
BNDG ESMARK 4X12 TAN STRL LF (GAUZE/BANDAGES/DRESSINGS) ×3 IMPLANT
BNDG GAUZE 4.5X4.1 6PLY STRL (MISCELLANEOUS) ×3 IMPLANT
CANISTER SUCT 1200ML W/VALVE (MISCELLANEOUS) ×3 IMPLANT
CLOSURE WOUND 1/4X4 (GAUZE/BANDAGES/DRESSINGS) ×1
CUFF TOURN 18 STER (MISCELLANEOUS) ×3 IMPLANT
CUFF TOURN DUAL PL 12 NO SLV (MISCELLANEOUS) IMPLANT
DRAPE FLUOR MINI C-ARM 54X84 (DRAPES) ×3 IMPLANT
DURAPREP 26ML APPLICATOR (WOUND CARE) ×3 IMPLANT
ELECT REM PT RETURN 9FT ADLT (ELECTROSURGICAL) ×3
ELECTRODE REM PT RTRN 9FT ADLT (ELECTROSURGICAL) ×1 IMPLANT
GAUZE PETRO XEROFOAM 1X8 (MISCELLANEOUS) ×3 IMPLANT
GAUZE SPONGE 4X4 12PLY STRL (GAUZE/BANDAGES/DRESSINGS) ×3 IMPLANT
GAUZE STRETCH 2X75IN STRL (MISCELLANEOUS) ×3 IMPLANT
GLOVE BIO SURGEON STRL SZ7.5 (GLOVE) ×3 IMPLANT
GLOVE INDICATOR 8.0 STRL GRN (GLOVE) ×3 IMPLANT
GOWN STRL REUS W/ TWL LRG LVL3 (GOWN DISPOSABLE) ×2 IMPLANT
GOWN STRL REUS W/TWL LRG LVL3 (GOWN DISPOSABLE) ×4
HANDPIECE VERSAJET DEBRIDEMENT (MISCELLANEOUS) ×3 IMPLANT
KIT RM TURNOVER STRD PROC AR (KITS) ×3 IMPLANT
LABEL OR SOLS (LABEL) ×3 IMPLANT
NEEDLE FILTER BLUNT 18X 1/2SAF (NEEDLE) ×2
NEEDLE FILTER BLUNT 18X1 1/2 (NEEDLE) ×1 IMPLANT
NEEDLE HYPO 25X1 1.5 SAFETY (NEEDLE) ×6 IMPLANT
NS IRRIG 500ML POUR BTL (IV SOLUTION) ×3 IMPLANT
PACK EXTREMITY ARMC (MISCELLANEOUS) ×3 IMPLANT
SOL .9 NS 3000ML IRR  AL (IV SOLUTION)
SOL .9 NS 3000ML IRR UROMATIC (IV SOLUTION) IMPLANT
SOL PREP PVP 2OZ (MISCELLANEOUS) ×3
SOLUTION PREP PVP 2OZ (MISCELLANEOUS) ×1 IMPLANT
STOCKINETTE STRL 6IN 960660 (GAUZE/BANDAGES/DRESSINGS) ×3 IMPLANT
STRIP CLOSURE SKIN 1/4X4 (GAUZE/BANDAGES/DRESSINGS) ×2 IMPLANT
SUT ETHILON 3-0 FS-10 30 BLK (SUTURE) ×3
SUT ETHILON 4-0 (SUTURE) ×2
SUT ETHILON 4-0 FS2 18XMFL BLK (SUTURE) ×1
SUTURE EHLN 3-0 FS-10 30 BLK (SUTURE) ×1 IMPLANT
SUTURE ETHLN 4-0 FS2 18XMF BLK (SUTURE) ×1 IMPLANT
SWAB DUAL CULTURE TRANS RED ST (MISCELLANEOUS) ×3 IMPLANT
SYRINGE 10CC LL (SYRINGE) ×3 IMPLANT

## 2017-07-19 NOTE — Anesthesia Postprocedure Evaluation (Signed)
Anesthesia Post Note  Patient: Jordan Mills  Procedure(s) Performed: AMPUTATION TOE/Rt 4th MPJ (Right Toe)  Patient location during evaluation: PACU Anesthesia Type: General Level of consciousness: awake and alert Pain management: pain level controlled Vital Signs Assessment: post-procedure vital signs reviewed and stable Respiratory status: spontaneous breathing, nonlabored ventilation, respiratory function stable and patient connected to nasal cannula oxygen Cardiovascular status: blood pressure returned to baseline and stable Postop Assessment: no apparent nausea or vomiting Anesthetic complications: no     Last Vitals:  Vitals:   07/19/17 1109 07/19/17 1121  BP: (!) 188/72 (!) 178/76  Pulse: 73 82  Resp: 18 18  Temp: (!) 36.1 C   SpO2: 100% 100%    Last Pain:  Vitals:   07/19/17 1109  TempSrc: Temporal  PainSc:                  Arneta Mahmood S

## 2017-07-19 NOTE — Anesthesia Post-op Follow-up Note (Signed)
Anesthesia QCDR form completed.        

## 2017-07-19 NOTE — Op Note (Signed)
Date of operation: 07/19/2017.  Surgeon: Durward Fortes DPM.  Preoperative diagnosis: Ulcer with gangrenous changes right fourth toe.  Postoperative diagnosis: Same.  Procedure: Amputation right fourth toe.  Anesthesia: LMA with local.  Hemostasis: None.  Estimated blood loss: Less than 1 cc.  Pathology: Right fourth toe.  Complications: None apparent, other than no significant bleeding.  Operative indications: This is a 66 year old male with a recent history of ulceration on his right fourth toe. Recently underwent revascularization procedure on the right lower extremity last month. Decision made to proceed with amputation of the right fourth toe.  Operative procedure: Patient was taken to the operating room and placed on the table in the supine position. Following satisfactory LMA anesthesia the right foot was anesthetized with 9 cc of 0.5% bupivacaine plain around the right fourth metatarsal region. The foot was prepped and draped in usual sterile fashion. Attention was directed to the distal aspect of the right foot where an elliptical incision was made from dorsal to plantar around the base of the right fourth toe with care taken to divide out all necrotic and devitalized tissue laterally. It was noted that even with the incision there was no significant bleeding. The incision was carried sharply down to the level of the metatarsophalangeal joint where the toe was disarticulated and removed in toto. The wound was flushed with copious amounts of sterile saline and closed using 3-0 nylon vertical mattress and simple interrupted sutures followed by 3-0 and 4-0 nylon simple interrupted sutures. Xeroform and a sterile gauze bandage was applied followed by Kerlix and an Ace wrap. Patient tolerated the procedure and anesthesia well and was transported to the PACU with vital signs stable and in good condition.

## 2017-07-19 NOTE — H&P (Signed)
  Medical history and physical in the chart was reviewed.  Patient stable for surgery. 

## 2017-07-19 NOTE — Discharge Instructions (Addendum)
1. Keep the bandage on the right foot clean, dry, and do not remove.  2. Sponge bathe only right lower extremity.  3. Wear surgical shoe on the right foot whenever walking or standing.  4. Take one to 2 pain pills, Norco, every 4 hours if needed for pain    AMBULATORY SURGERY  DISCHARGE INSTRUCTIONS   1) The drugs that you were given will stay in your system until tomorrow so for the next 24 hours you should not:  A) Drive an automobile B) Make any legal decisions C) Drink any alcoholic beverage   2) You may resume regular meals tomorrow.  Today it is better to start with liquids and gradually work up to solid foods.  You may eat anything you prefer, but it is better to start with liquids, then soup and crackers, and gradually work up to solid foods.   3) Please notify your doctor immediately if you have any unusual bleeding, trouble breathing, redness and pain at the surgery site, drainage, fever, or pain not relieved by medication.    4) Additional Instructions:        Please contact your physician with any problems or Same Day Surgery at [phone], Monday through Friday 6 am to 4 pm, or [place] at [facility] number at [phone].

## 2017-07-19 NOTE — Transfer of Care (Signed)
Immediate Anesthesia Transfer of Care Note  Patient: Glynda Jaeger  Procedure(s) Performed: AMPUTATION TOE/Rt 4th MPJ (Right Toe)  Patient Location: PACU  Anesthesia Type:General  Level of Consciousness: sedated  Airway & Oxygen Therapy: Patient Spontanous Breathing and Patient connected to face mask oxygen  Post-op Assessment: Report given to RN and Post -op Vital signs reviewed and stable  Post vital signs: Reviewed and stable  Last Vitals:  Vitals:   07/19/17 0755 07/19/17 1024  BP: (!) 180/62 137/71  Pulse: 77 78  Resp: 17 11  Temp: (!) 36.2 C (!) 36.1 C  SpO2: 100% 100%    Last Pain:  Vitals:   07/19/17 1024  TempSrc: Temporal  PainSc:          Complications: No apparent anesthesia complications

## 2017-07-19 NOTE — Anesthesia Procedure Notes (Signed)
Procedure Name: LMA Insertion Date/Time: 07/19/2017 9:31 AM Performed by: Johnna Acosta Pre-anesthesia Checklist: Patient identified, Emergency Drugs available, Suction available, Patient being monitored and Timeout performed Patient Re-evaluated:Patient Re-evaluated prior to induction Oxygen Delivery Method: Circle system utilized Preoxygenation: Pre-oxygenation with 100% oxygen Induction Type: IV induction LMA: LMA inserted LMA Size: 4.0 Tube type: Oral Number of attempts: 1 Placement Confirmation: positive ETCO2 and breath sounds checked- equal and bilateral Tube secured with: Tape Dental Injury: Teeth and Oropharynx as per pre-operative assessment

## 2017-07-19 NOTE — Interval H&P Note (Signed)
History and Physical Interval Note:  07/19/2017 9:23 AM  Jordan Mills  has presented today for surgery, with the diagnosis of ulcer rt 4th with necrosis of muscle L97.513  The various methods of treatment have been discussed with the patient and family. After consideration of risks, benefits and other options for treatment, the patient has consented to  Procedure(s): AMPUTATION TOE/Rt 4th MPJ (Right) as a surgical intervention .  The patient's history has been reviewed, patient examined, no change in status, stable for surgery.  I have reviewed the patient's chart and labs.  Questions were answered to the patient's satisfaction.     Durward Fortes

## 2017-07-19 NOTE — Anesthesia Preprocedure Evaluation (Addendum)
Anesthesia Evaluation  Patient identified by MRN, date of birth, ID band Patient awake    Reviewed: Allergy & Precautions, NPO status , Patient's Chart, lab work & pertinent test results, reviewed documented beta blocker date and time   Airway Mallampati: II  TM Distance: >3 FB     Dental  (+) Chipped, Dental Advisory Given, Poor Dentition, Missing   Pulmonary Current Smoker,           Cardiovascular hypertension, Pt. on medications and Pt. on home beta blockers + Peripheral Vascular Disease       Neuro/Psych    GI/Hepatic   Endo/Other    Renal/GU      Musculoskeletal   Abdominal   Peds  Hematology   Anesthesia Other Findings Back teeth mildly loose. Don't expect problems.  Reproductive/Obstetrics                            Anesthesia Physical Anesthesia Plan  ASA: III  Anesthesia Plan: General   Post-op Pain Management:    Induction: Intravenous  PONV Risk Score and Plan:   Airway Management Planned: LMA  Additional Equipment:   Intra-op Plan:   Post-operative Plan:   Informed Consent: I have reviewed the patients History and Physical, chart, labs and discussed the procedure including the risks, benefits and alternatives for the proposed anesthesia with the patient or authorized representative who has indicated his/her understanding and acceptance.     Plan Discussed with: CRNA  Anesthesia Plan Comments:         Anesthesia Quick Evaluation

## 2017-07-23 LAB — SURGICAL PATHOLOGY

## 2017-08-05 DIAGNOSIS — T8743 Infection of amputation stump, right lower extremity: Secondary | ICD-10-CM | POA: Diagnosis not present

## 2017-08-05 DIAGNOSIS — I10 Essential (primary) hypertension: Secondary | ICD-10-CM | POA: Diagnosis not present

## 2017-08-05 DIAGNOSIS — T8781 Dehiscence of amputation stump: Secondary | ICD-10-CM | POA: Diagnosis not present

## 2017-08-05 DIAGNOSIS — B9689 Other specified bacterial agents as the cause of diseases classified elsewhere: Secondary | ICD-10-CM | POA: Diagnosis not present

## 2017-08-05 DIAGNOSIS — I739 Peripheral vascular disease, unspecified: Secondary | ICD-10-CM | POA: Diagnosis not present

## 2017-08-06 ENCOUNTER — Telehealth (INDEPENDENT_AMBULATORY_CARE_PROVIDER_SITE_OTHER): Payer: Self-pay

## 2017-08-07 DIAGNOSIS — T8781 Dehiscence of amputation stump: Secondary | ICD-10-CM | POA: Diagnosis not present

## 2017-08-07 DIAGNOSIS — T8743 Infection of amputation stump, right lower extremity: Secondary | ICD-10-CM | POA: Diagnosis not present

## 2017-08-07 DIAGNOSIS — B9689 Other specified bacterial agents as the cause of diseases classified elsewhere: Secondary | ICD-10-CM | POA: Diagnosis not present

## 2017-08-07 DIAGNOSIS — I739 Peripheral vascular disease, unspecified: Secondary | ICD-10-CM | POA: Diagnosis not present

## 2017-08-07 DIAGNOSIS — I10 Essential (primary) hypertension: Secondary | ICD-10-CM | POA: Diagnosis not present

## 2017-08-08 ENCOUNTER — Other Ambulatory Visit (INDEPENDENT_AMBULATORY_CARE_PROVIDER_SITE_OTHER): Payer: Self-pay | Admitting: Vascular Surgery

## 2017-08-08 ENCOUNTER — Ambulatory Visit (INDEPENDENT_AMBULATORY_CARE_PROVIDER_SITE_OTHER): Payer: Medicare Other

## 2017-08-08 DIAGNOSIS — M79604 Pain in right leg: Secondary | ICD-10-CM

## 2017-08-08 DIAGNOSIS — I70239 Atherosclerosis of native arteries of right leg with ulceration of unspecified site: Secondary | ICD-10-CM

## 2017-08-08 DIAGNOSIS — I7025 Atherosclerosis of native arteries of other extremities with ulceration: Secondary | ICD-10-CM

## 2017-08-09 ENCOUNTER — Ambulatory Visit (INDEPENDENT_AMBULATORY_CARE_PROVIDER_SITE_OTHER): Payer: Medicare Other | Admitting: Vascular Surgery

## 2017-08-09 ENCOUNTER — Encounter (INDEPENDENT_AMBULATORY_CARE_PROVIDER_SITE_OTHER): Payer: Self-pay | Admitting: Vascular Surgery

## 2017-08-09 ENCOUNTER — Encounter (INDEPENDENT_AMBULATORY_CARE_PROVIDER_SITE_OTHER): Payer: Self-pay

## 2017-08-09 ENCOUNTER — Other Ambulatory Visit (INDEPENDENT_AMBULATORY_CARE_PROVIDER_SITE_OTHER): Payer: Self-pay | Admitting: Vascular Surgery

## 2017-08-09 VITALS — BP 133/74 | HR 70 | Resp 15 | Ht 67.0 in | Wt 139.0 lb

## 2017-08-09 DIAGNOSIS — I10 Essential (primary) hypertension: Secondary | ICD-10-CM | POA: Diagnosis not present

## 2017-08-09 DIAGNOSIS — E785 Hyperlipidemia, unspecified: Secondary | ICD-10-CM | POA: Diagnosis not present

## 2017-08-09 DIAGNOSIS — I7025 Atherosclerosis of native arteries of other extremities with ulceration: Secondary | ICD-10-CM

## 2017-08-09 NOTE — Progress Notes (Signed)
MRN : [id number]  Jordan Mills is a 66 y.o. (Jul 27, 1951) male who presents with chief complaint of  Chief Complaint  Patient presents with  . Follow-up    U/s follow up  .  History of Present Illness: Patient returns today in follow up of PAD.  He has had his toe amputation but his wound and the surrounding ulcer healed.  He says he has stopped smoking.  His noninvasive studies performed yesterday demonstrated occlusion of his previous right lower extremity interventions with only monophasic flow in the foot.  No fever or chills.  The foot is quite painful pretty much around the clock.  Current Outpatient Prescriptions  Medication Sig Dispense Refill  . amLODipine (NORVASC) 5 MG tablet Take 5 mg by mouth 2 (two) times daily.  10  . amoxicillin-clavulanate (AUGMENTIN) 875-125 MG tablet Take 1 tablet by mouth 2 (two) times daily.  0  . aspirin EC 81 MG tablet Take by mouth.    Marland Kitchen atorvastatin (LIPITOR) 10 MG tablet Take 1 tablet (10 mg total) by mouth daily. 30 tablet 11  . clopidogrel (PLAVIX) 75 MG tablet Take 1 tablet (75 mg total) by mouth daily. 30 tablet 11  . colchicine 0.6 MG tablet Take 0.6 mg by mouth 3 (three) times daily.  1  . HYDROcodone-acetaminophen (NORCO/VICODIN) 5-325 MG tablet Take 1-2 tablets by mouth every 4 (four) hours as needed for moderate pain. 30 tablet 0  . indomethacin (INDOCIN) 25 MG capsule Take 25 mg by mouth 3 (three) times daily.  2  . lisinopril-hydrochlorothiazide (PRINZIDE,ZESTORETIC) 20-12.5 MG tablet Take 2 tablets by mouth daily.  5  . metoprolol (LOPRESSOR) 50 MG tablet Take 50 mg by mouth 2 (two) times daily.  4   No current facility-administered medications for this visit.     Past Medical History:  Diagnosis Date  . Gout   . HLD (hyperlipidemia)   . Hypertension   . Peripheral vascular disease [facility])     Past Surgical History:  Procedure Laterality Date  . AMPUTATION TOE Right 07/19/2017   Procedure: AMPUTATION TOE/Rt 4th MPJ;   Surgeon: Sharlotte Alamo, DPM;  Location: [facility] ORS;  Service: Podiatry;  Laterality: Right;  . LOWER EXTREMITY ANGIOGRAPHY Right 07/01/2017   Procedure: Lower Extremity Angiography;  Surgeon: Algernon Huxley, MD;  Location: [place] [facility];  Service: Cardiovascular;  Laterality: Right;  . TONSILLECTOMY      Social History      Social History  Substance Use Topics  . Smoking status: Current Every Day Smoker    Packs/day: 0.20    Types: Cigarettes  . Smokeless tobacco: Never Used  . Alcohol use No    Family History      Family History  Problem Relation Age of Onset  . Hypertension Mother   . Hyperlipidemia Mother   . Diabetes Mother   . Stroke Mother   . Lung cancer Sister      No current facility-administered medications for this visit.     No Known Allergies    REVIEW OF SYSTEMS(Negative unless checked)  Constitutional: '[]' Weight loss'[]' Fever'[]' Chills Cardiac:'[]' Chest pain'[]' Chest pressure'[]' Palpitations '[]' Shortness of breath when laying flat '[]' Shortness of breath at rest '[]' Shortness of breath with exertion. Vascular: '[x]' Pain in legs with walking'[]' Pain in legsat rest'[]' Pain in legs when laying flat '[x]' Claudication '[]' Pain in feet when walking '[x]' Pain in feet at rest '[]' Pain in feet when laying flat '[]' History of DVT '[]' Phlebitis '[]' Swelling in legs '[]' Varicose veins '[x]' Non-healing ulcers Pulmonary: '[]' Uses home oxygen '[]' Productive cough'[]' Hemoptysis '[]' Wheeze '[]'   COPD '[]' Asthma Neurologic: '[]' Dizziness '[]' Blackouts '[]' Seizures '[]' History of stroke '[]' History of TIA'[]' Aphasia '[]' Temporary blindness'[]' Dysphagia '[]' Weaknessor numbness in arms '[]' Weakness or numbnessin legs Musculoskeletal: '[]' Arthritis '[]' Joint swelling '[]' Joint pain '[]' Low back pain Hematologic:'[]' Easy bruising'[]' Easy bleeding '[]' Hypercoagulable state '[]' Anemic '[]' Hepatitis Gastrointestinal:'[]' Blood in stool'[]' Vomiting  blood'[]' Gastroesophageal reflux/heartburn'[]' Abdominal pain Genitourinary: '[]' Chronic kidney disease '[]' Difficulturination '[]' Frequenturination '[]' Burning with urination'[]' Hematuria Skin: '[]' Rashes '[x]' Ulcers '[x]' Wounds Psychological: '[]' History of anxiety'[]' History of major depression.   Physical Examination  BP 133/74 (BP Location: Right Arm)   Pulse 70   Resp 15   Ht '5\' 7"'  (1.702 m)   Wt 63 kg (139 lb)   BMI 21.77 kg/m  Gen:  WD/WN, NAD Head: [place]/AT, No temporalis wasting. Ear/Nose/Throat: Hearing grossly intact, nares w/o erythema or drainage, trachea midline Eyes: Conjunctiva clear. Sclera non-icteric Neck: Supple.  No JVD.  Pulmonary:  Good air movement, no use of accessory muscles.  Cardiac: RRR, normal S1, S2 Vascular:  Vessel Right Left  Radial Palpable Palpable                          PT Not Palpable 1+ Palpable  DP Not Palpable 1+ Palpable    Musculoskeletal: M/S 5/5 throughout.  No deformity or atrophy. 2+ RLE edema. Non-healing ulceration on the toe amputation site. Neurologic: Sensation grossly intact in extremities.  Symmetrical.  Speech is fluent.  Psychiatric: Judgment intact, Mood & affect appropriate for pt's clinical situation. Dermatologic: right foot wound as above      Labs Recent Results (from the past 2160 hour(s))  Basic metabolic panel     Status: Abnormal   Collection Time: 05/22/17  1:02 PM  Result Value Ref Range   Sodium 136 135 - 145 mmol/L   Potassium 3.8 3.5 - 5.1 mmol/L   Chloride 104 101 - 111 mmol/L   CO2 23 22 - 32 mmol/L   Glucose, Bld 101 (H) 65 - 99 mg/dL   BUN 52 (H) 6 - 20 mg/dL   Creatinine, Ser 2.48 (H) 0.61 - 1.24 mg/dL   Calcium 9.6 8.9 - 10.3 mg/dL   GFR calc non Af Amer 25 (L) >60 mL/min   GFR calc Af Amer 29 (L) >60 mL/min    Comment: (NOTE) The eGFR has been calculated using the CKD EPI equation. This calculation has not been validated in all clinical situations. eGFR's persistently <60  mL/min signify possible Chronic Kidney Disease.    Anion gap 9 5 - 15  CBC     Status: Abnormal   Collection Time: 05/22/17  1:02 PM  Result Value Ref Range   WBC 18.3 (H) 3.8 - 10.6 K/uL   RBC 4.39 (L) 4.40 - 5.90 MIL/uL   Hemoglobin 12.3 (L) 13.0 - 18.0 g/dL   HCT 37.4 (L) 40.0 - 52.0 %   MCV 85.4 80.0 - 100.0 fL   MCH 28.0 26.0 - 34.0 pg   MCHC 32.8 32.0 - 36.0 g/dL   RDW 14.7 (H) 11.5 - 14.5 %   Platelets 306 150 - 440 K/uL  Troponin I     Status: None   Collection Time: 05/22/17  1:02 PM  Result Value Ref Range   Troponin I <0.03 <0.03 ng/mL  Troponin I     Status: None   Collection Time: 05/22/17  7:01 PM  Result Value Ref Range   Troponin I <0.03 <0.03 ng/mL  BUN     Status: Abnormal   Collection Time: 07/01/17  7:38 AM  Result Value Ref Range   BUN 28 (H)  6 - 20 mg/dL  Creatinine, serum     Status: Abnormal   Collection Time: 07/01/17  7:38 AM  Result Value Ref Range   Creatinine, Ser 1.66 (H) 0.61 - 1.24 mg/dL   GFR calc non Af Amer 41 (L) >60 mL/min   GFR calc Af Amer 47 (L) >60 mL/min    Comment: (NOTE) The eGFR has been calculated using the CKD EPI equation. This calculation has not been validated in all clinical situations. eGFR's persistently <60 mL/min signify possible Chronic Kidney Disease.   CBC     Status: Abnormal   Collection Time: 07/15/17 10:54 AM  Result Value Ref Range   WBC 11.7 (H) 3.8 - 10.6 K/uL   RBC 3.87 (L) 4.40 - 5.90 MIL/uL   Hemoglobin 11.1 (L) 13.0 - 18.0 g/dL   HCT 33.7 (L) 40.0 - 52.0 %   MCV 87.1 80.0 - 100.0 fL   MCH 28.8 26.0 - 34.0 pg   MCHC 33.0 32.0 - 36.0 g/dL   RDW 14.6 (H) 11.5 - 14.5 %   Platelets 312 150 - 440 K/uL  Basic metabolic panel     Status: Abnormal   Collection Time: 07/15/17 10:54 AM  Result Value Ref Range   Sodium 141 135 - 145 mmol/L   Potassium 4.4 3.5 - 5.1 mmol/L   Chloride 105 101 - 111 mmol/L   CO2 26 22 - 32 mmol/L   Glucose, Bld 96 65 - 99 mg/dL   BUN 23 (H) 6 - 20 mg/dL   Creatinine,  Ser 1.39 (H) 0.61 - 1.24 mg/dL   Calcium 9.7 8.9 - 10.3 mg/dL   GFR calc non Af Amer 51 (L) >60 mL/min   GFR calc Af Amer 59 (L) >60 mL/min    Comment: (NOTE) The eGFR has been calculated using the CKD EPI equation. This calculation has not been validated in all clinical situations. eGFR's persistently <60 mL/min signify possible Chronic Kidney Disease.    Anion gap 10 5 - 15  Surgical pathology     Status: None   Collection Time: 07/19/17 10:00 AM  Result Value Ref Range   SURGICAL PATHOLOGY      Surgical Pathology CASE: [id number] PATIENT: Farhaan Spurgin Surgical Pathology Report     SPECIMEN SUBMITTED: A. Toe, right 4th  CLINICAL HISTORY: None provided  PRE-OPERATIVE DIAGNOSIS: Ulcer rt 4th with necrosis of muscle L97.513  POST-OPERATIVE DIAGNOSIS: Same as pre op     DIAGNOSIS: A. TOE, RIGHT FOURTH; AMPUTATION: - SKIN AND SOFT TISSUE WITH CHRONIC ULCERATION. - SKELETAL MUSCLE NECROSIS. - SOFT TISSUE AT MARGIN WITH NECROSIS. - SEPARATE FRAGMENT OF VIABLE SKIN.   GROSS DESCRIPTION:  A. Labeled: right fourth toe  Size: 4.4 x 1.5 x 1.2 cm digit and a 1.1 x 0.9 x 0.3 cm separate fragment of skin  Description of lesion(s): skin color variegation to the margin with a 1.7 x 1.1 cm denuded area that extends to skin margin on the digit  Proximal margin: separate fragment of skin not oriented therefore true skin margin not identified  Bone: bone at margin is a smooth articular surface  Other findings: margin of digit inked blue, margin o f separate fragment of tissue inked black  Block summary: 1-en face skin margin with ulcer on digit 2-en face bone margin on digit following decalcification 3-representative ulcer and underlying bone on digit following decalcification 4-representative separate fragment of skin Final Diagnosis performed by Quay Burow, MD.  Electronically signed 07/23/2017 9:39:59AM    The  electronic signature indicates that the  named Attending Pathologist has evaluated the specimen  Technical component performed at [facility], [address] [place], [place] [address] Lab: [phone] Dir: Darrick Penna. Evette Doffing, MD  Professional component performed at [facility], [facility], [place], [place], [place] [address] Lab: [phone] Dir: Dellia Nims. Reuel Derby, MD      Radiology No results found.   Assessment/Plan  HLD (hyperlipidemia) lipid control important in reducing the progression of atherosclerotic disease. Continue statin therapy   Hypertension blood pressure control important in reducing the progression of atherosclerotic disease. On appropriate oral medications.  Atherosclerosis of native arteries of the extremities with ulceration ([place]) His noninvasive studies performed yesterday demonstrated occlusion of his previous right lower extremity interventions with only monophasic flow in the foot. This is a very critical and limb threatening situation.  Given his early recurrence, and the extremely extensive disease that was encountered at his original knee, I would recommend a repeat angiogram with consideration for revascularization in the very near future.  Surgical bypass may also have to be considered.    [name], MD  08/09/2017 10:29 AM    This note was created with Dragon medical transcription system.  Any errors from dictation are purely unintentional

## 2017-08-09 NOTE — Assessment & Plan Note (Signed)
His noninvasive studies performed yesterday demonstrated occlusion of his previous right lower extremity interventions with only monophasic flow in the foot. This is a very critical and limb threatening situation.  Given his early recurrence, and the extremely extensive disease that was encountered at his original knee, I would recommend a repeat angiogram with consideration for revascularization in the very near future.  Surgical bypass may also have to be considered.

## 2017-08-09 NOTE — Patient Instructions (Signed)
Angiogram  An angiogram, also called angiography, is a procedure used to look at the blood vessels. In this procedure, dye is injected through a long, thin tube (catheter) into an artery. X-rays are then taken. The X-rays will show if there is a blockage or problem in a blood vessel.  Tell a health care provider about:   Any allergies you have, including allergies to shellfish or contrast dye.   All medicines you are taking, including vitamins, herbs, eye drops, creams, and over-the-counter medicines.   Any problems you or family members have had with anesthetic medicines.   Any blood disorders you have.   Any surgeries you have had.   Any previous kidney problems or failure you have had.   Any medical conditions you have.   Possibility of pregnancy, if this applies.  What are the risks?  Generally, an angiogram is a safe procedure. However, as with any procedure, problems can occur. Possible problems include:   Injury to the blood vessels, including rupture or bleeding.   Infection or bruising at the catheter site.   Allergic reaction to the dye or contrast used.   Kidney damage from the dye or contrast used.   Blood clots that can lead to a stroke or heart attack.    What happens before the procedure?   Do not eat or drink after midnight on the night before the procedure, or as directed by your health care provider.   Ask your health care provider if you may drink enough water to take any needed medicines the morning of the procedure.  What happens during the procedure?   You may be given a medicine to help you relax (sedative) before and during the procedure. This medicine is given through an IV access tube that is inserted into one of your veins.   The area where the catheter will be inserted will be washed and shaved. This is usually done in the groin but may be done in the fold of your arm (near your elbow) or in the wrist.   A medicine will be given to numb the area where the catheter will  be inserted (local anesthetic).   The catheter will be inserted with a guide wire into an artery. The catheter is guided by using a type of X-ray (fluoroscopy) to the blood vessel being examined.   Dye is then injected into the catheter, and X-rays are taken. The dye helps to show where any narrowing or blockages are located.  What happens after the procedure?   If the procedure is done through the leg, you will be kept in bed lying flat for several hours. You will be instructed to not bend or cross your legs.   The insertion site will be checked frequently.   The pulse in your feet or wrist will be checked frequently.   Additional blood tests, X-rays, and electrocardiography may be done.   You may need to stay in the hospital overnight for observation.  This information is not intended to replace advice given to you by your health care provider. Make sure you discuss any questions you have with your health care provider.  Document Released: 07/11/2005 Document Revised: 03/14/2016 Document Reviewed: 03/04/2013  Elsevier Interactive Patient Education  2017 [employer]

## 2017-08-12 DIAGNOSIS — T8781 Dehiscence of amputation stump: Secondary | ICD-10-CM | POA: Diagnosis not present

## 2017-08-12 DIAGNOSIS — I739 Peripheral vascular disease, unspecified: Secondary | ICD-10-CM | POA: Diagnosis not present

## 2017-08-12 DIAGNOSIS — I10 Essential (primary) hypertension: Secondary | ICD-10-CM | POA: Diagnosis not present

## 2017-08-12 DIAGNOSIS — T8743 Infection of amputation stump, right lower extremity: Secondary | ICD-10-CM | POA: Diagnosis not present

## 2017-08-12 DIAGNOSIS — B9689 Other specified bacterial agents as the cause of diseases classified elsewhere: Secondary | ICD-10-CM | POA: Diagnosis not present

## 2017-08-13 ENCOUNTER — Ambulatory Visit (INDEPENDENT_AMBULATORY_CARE_PROVIDER_SITE_OTHER): Payer: Medicare Other | Admitting: Vascular Surgery

## 2017-08-13 ENCOUNTER — Encounter (INDEPENDENT_AMBULATORY_CARE_PROVIDER_SITE_OTHER): Payer: Self-pay

## 2017-08-13 ENCOUNTER — Encounter (INDEPENDENT_AMBULATORY_CARE_PROVIDER_SITE_OTHER): Payer: Self-pay | Admitting: Vascular Surgery

## 2017-08-13 ENCOUNTER — Encounter
Admission: RE | Admit: 2017-08-13 | Discharge: 2017-08-13 | Disposition: A | Discharge status: Home / Self Care | Payer: Medicare Other | Source: Ambulatory Visit | Attending: Vascular Surgery | Admitting: Vascular Surgery

## 2017-08-13 VITALS — BP 154/79 | HR 100 | Resp 16 | Ht 67.0 in | Wt 138.0 lb

## 2017-08-13 DIAGNOSIS — I7025 Atherosclerosis of native arteries of other extremities with ulceration: Secondary | ICD-10-CM

## 2017-08-13 LAB — CREATININE, SERUM
Creatinine, Ser: 1.05 mg/dL (ref 0.61–1.24)
GFR calc non Af Amer: >60 mL/min (ref >60)

## 2017-08-13 LAB — BUN: BUN: 12 mg/dL (ref 6–20)

## 2017-08-13 NOTE — Patient Instructions (Signed)
Follow instructions given to you by Dr. Lucky Cowboy

## 2017-08-13 NOTE — Progress Notes (Signed)
Patient seen last week and scheduled for procedure later in the week.  Not seen today as no new issues.

## 2017-08-14 DIAGNOSIS — I10 Essential (primary) hypertension: Secondary | ICD-10-CM | POA: Diagnosis not present

## 2017-08-14 DIAGNOSIS — T8743 Infection of amputation stump, right lower extremity: Secondary | ICD-10-CM | POA: Diagnosis not present

## 2017-08-14 DIAGNOSIS — I739 Peripheral vascular disease, unspecified: Secondary | ICD-10-CM | POA: Diagnosis not present

## 2017-08-14 DIAGNOSIS — B9689 Other specified bacterial agents as the cause of diseases classified elsewhere: Secondary | ICD-10-CM | POA: Diagnosis not present

## 2017-08-14 DIAGNOSIS — T8781 Dehiscence of amputation stump: Secondary | ICD-10-CM | POA: Diagnosis not present

## 2017-08-14 MED ORDER — CEFAZOLIN SODIUM-DEXTROSE 2-4 GM/100ML-% IV SOLN
2.0000 g | Freq: Once | INTRAVENOUS | Status: AC
  Administered 2017-08-15: 2 g via INTRAVENOUS

## 2017-08-15 ENCOUNTER — Encounter
Admission: RE | Disposition: A | Discharge status: Home / Self Care | Payer: Self-pay | Source: Ambulatory Visit | Attending: Vascular Surgery

## 2017-08-15 ENCOUNTER — Inpatient Hospital Stay
Admission: RE | Admit: 2017-08-15 | Discharge: 2017-08-18 | DRG: 272 | Disposition: A | Discharge status: Home / Self Care | Payer: Medicare Other | Source: Ambulatory Visit | Attending: Vascular Surgery | Admitting: Vascular Surgery

## 2017-08-15 DIAGNOSIS — I739 Peripheral vascular disease, unspecified: Secondary | ICD-10-CM | POA: Diagnosis not present

## 2017-08-15 DIAGNOSIS — I10 Essential (primary) hypertension: Secondary | ICD-10-CM | POA: Diagnosis present

## 2017-08-15 DIAGNOSIS — L97518 Non-pressure chronic ulcer of other part of right foot with other specified severity: Secondary | ICD-10-CM | POA: Diagnosis present

## 2017-08-15 DIAGNOSIS — I998 Other disorder of circulatory system: Secondary | ICD-10-CM | POA: Diagnosis present

## 2017-08-15 DIAGNOSIS — Z89421 Acquired absence of other right toe(s): Secondary | ICD-10-CM | POA: Diagnosis not present

## 2017-08-15 DIAGNOSIS — Z23 Encounter for immunization: Secondary | ICD-10-CM | POA: Diagnosis not present

## 2017-08-15 DIAGNOSIS — Z87891 Personal history of nicotine dependence: Secondary | ICD-10-CM

## 2017-08-15 DIAGNOSIS — I70239 Atherosclerosis of native arteries of right leg with ulceration of unspecified site: Secondary | ICD-10-CM | POA: Diagnosis not present

## 2017-08-15 DIAGNOSIS — I70235 Atherosclerosis of native arteries of right leg with ulceration of other part of foot: Principal | ICD-10-CM | POA: Diagnosis present

## 2017-08-15 DIAGNOSIS — E785 Hyperlipidemia, unspecified: Secondary | ICD-10-CM | POA: Diagnosis present

## 2017-08-15 DIAGNOSIS — I70238 Atherosclerosis of native arteries of right leg with ulceration of other part of lower right leg: Secondary | ICD-10-CM | POA: Diagnosis not present

## 2017-08-15 DIAGNOSIS — Z7902 Long term (current) use of antithrombotics/antiplatelets: Secondary | ICD-10-CM | POA: Diagnosis not present

## 2017-08-15 DIAGNOSIS — Z823 Family history of stroke: Secondary | ICD-10-CM | POA: Diagnosis not present

## 2017-08-15 DIAGNOSIS — Z79899 Other long term (current) drug therapy: Secondary | ICD-10-CM

## 2017-08-15 DIAGNOSIS — M109 Gout, unspecified: Secondary | ICD-10-CM | POA: Diagnosis present

## 2017-08-15 DIAGNOSIS — Z8249 Family history of ischemic heart disease and other diseases of the circulatory system: Secondary | ICD-10-CM | POA: Diagnosis not present

## 2017-08-15 DIAGNOSIS — I70221 Atherosclerosis of native arteries of extremities with rest pain, right leg: Secondary | ICD-10-CM | POA: Diagnosis not present

## 2017-08-15 DIAGNOSIS — Z7982 Long term (current) use of aspirin: Secondary | ICD-10-CM | POA: Diagnosis not present

## 2017-08-15 DIAGNOSIS — Z833 Family history of diabetes mellitus: Secondary | ICD-10-CM | POA: Diagnosis not present

## 2017-08-15 DIAGNOSIS — Z801 Family history of malignant neoplasm of trachea, bronchus and lung: Secondary | ICD-10-CM | POA: Diagnosis not present

## 2017-08-15 HISTORY — PX: LOWER EXTREMITY ANGIOGRAPHY: CATH118251

## 2017-08-15 HISTORY — PX: LOWER EXTREMITY INTERVENTION: CATH118252

## 2017-08-15 LAB — CBC
HCT: 31.4 % — ABNORMAL LOW (ref 40.0–52.0)
HEMOGLOBIN: 10.3 g/dL — AB (ref 13.0–18.0)
MCH: 28.2 pg (ref 26.0–34.0)
MCHC: 32.9 g/dL (ref 32.0–36.0)
MCV: 85.8 fL (ref 80.0–100.0)
Platelets: 389 10*3/uL (ref 150–440)
RBC: 3.66 MIL/uL — ABNORMAL LOW (ref 4.40–5.90)
RDW: 15 % — AB (ref 11.5–14.5)
WBC: 17.7 10*3/uL — ABNORMAL HIGH (ref 3.8–10.6)

## 2017-08-15 LAB — FIBRINOGEN: FIBRINOGEN: 527 mg/dL — AB (ref 210–475)

## 2017-08-15 LAB — HEPARIN LEVEL (UNFRACTIONATED): Heparin Unfractionated: 0.38 IU/mL (ref 0.30–0.70)

## 2017-08-15 LAB — MRSA PCR SCREENING: MRSA by PCR: NEGATIVE

## 2017-08-15 LAB — GLUCOSE, CAPILLARY: GLUCOSE-CAPILLARY: 137 mg/dL — AB (ref 65–99)

## 2017-08-15 SURGERY — LOWER EXTREMITY ANGIOGRAPHY
Anesthesia: Moderate Sedation | Laterality: Right

## 2017-08-15 MED ORDER — HYDRALAZINE HCL 20 MG/ML IJ SOLN
INTRAMUSCULAR | Status: AC
Start: 2017-08-15 — End: ?
  Filled 2017-08-15: qty 1

## 2017-08-15 MED ORDER — ATORVASTATIN CALCIUM 10 MG PO TABS
10.0000 mg | ORAL_TABLET | Freq: Every day | ORAL | Status: DC
  Administered 2017-08-15 – 2017-08-18 (×4): 10 mg via ORAL
  Filled 2017-08-15 (×4): qty 1

## 2017-08-15 MED ORDER — DIPHENHYDRAMINE HCL 50 MG/ML IJ SOLN
INTRAMUSCULAR | Status: AC
  Filled 2017-08-15: qty 1

## 2017-08-15 MED ORDER — HYDRALAZINE HCL 20 MG/ML IJ SOLN
10.0000 mg | Freq: Once | INTRAMUSCULAR | Status: AC
  Administered 2017-08-15: 10 mg via INTRAVENOUS

## 2017-08-15 MED ORDER — FENTANYL CITRATE (PF) 100 MCG/2ML IJ SOLN
INTRAMUSCULAR | Status: AC
  Filled 2017-08-15: qty 2

## 2017-08-15 MED ORDER — IOPAMIDOL (ISOVUE-300) INJECTION 61%
INTRAVENOUS | Status: DC | PRN
  Administered 2017-08-15: 75 mL via INTRA_ARTERIAL

## 2017-08-15 MED ORDER — LISINOPRIL-HYDROCHLOROTHIAZIDE 20-12.5 MG PO TABS: 2.0000 | ORAL_TABLET | Freq: Every day | ORAL | Status: DC

## 2017-08-15 MED ORDER — LISINOPRIL 20 MG PO TABS
40.0000 mg | ORAL_TABLET | Freq: Every day | ORAL | Status: DC
  Administered 2017-08-15 – 2017-08-18 (×4): 40 mg via ORAL
  Filled 2017-08-15 (×4): qty 2

## 2017-08-15 MED ORDER — LABETALOL HCL 5 MG/ML IV SOLN: 10.0000 mg | INTRAVENOUS | Status: DC | PRN

## 2017-08-15 MED ORDER — HEPARIN SODIUM (PORCINE) 1000 UNIT/ML IJ SOLN
INTRAMUSCULAR | Status: DC | PRN
  Administered 2017-08-15: 5000 [IU] via INTRAVENOUS

## 2017-08-15 MED ORDER — SODIUM CHLORIDE 0.9 % IV SOLN: 250.0000 mL | INTRAVENOUS | Status: DC | PRN

## 2017-08-15 MED ORDER — ASPIRIN EC 81 MG PO TBEC
81.0000 mg | DELAYED_RELEASE_TABLET | Freq: Every day | ORAL | Status: DC
  Administered 2017-08-16 – 2017-08-18 (×3): 81 mg via ORAL
  Filled 2017-08-15 (×3): qty 1

## 2017-08-15 MED ORDER — MIDAZOLAM HCL 5 MG/5ML IJ SOLN
INTRAMUSCULAR | Status: AC
  Filled 2017-08-15: qty 5

## 2017-08-15 MED ORDER — METHYLPREDNISOLONE SODIUM SUCC 125 MG IJ SOLR: 125.0000 mg | INTRAMUSCULAR | Status: DC | PRN

## 2017-08-15 MED ORDER — LABETALOL HCL 5 MG/ML IV SOLN
INTRAVENOUS | Status: DC | PRN
  Administered 2017-08-15 (×2): 10 mg via INTRAVENOUS

## 2017-08-15 MED ORDER — FENTANYL CITRATE (PF) 100 MCG/2ML IJ SOLN
INTRAMUSCULAR | Status: DC | PRN
  Administered 2017-08-15 (×2): 25 ug via INTRAVENOUS
  Administered 2017-08-15: 50 ug via INTRAVENOUS
  Administered 2017-08-15 (×2): 25 ug via INTRAVENOUS

## 2017-08-15 MED ORDER — SODIUM CHLORIDE 0.9 % IV SOLN: INTRAVENOUS | Status: DC

## 2017-08-15 MED ORDER — DEXMEDETOMIDINE HCL IN NACL 400 MCG/100ML IV SOLN: 0.4000 ug/kg/h | INTRAVENOUS | Status: DC

## 2017-08-15 MED ORDER — AMLODIPINE BESYLATE 5 MG PO TABS
5.0000 mg | ORAL_TABLET | Freq: Two times a day (BID) | ORAL | Status: DC
  Administered 2017-08-15 – 2017-08-18 (×6): 5 mg via ORAL
  Filled 2017-08-15 (×6): qty 1

## 2017-08-15 MED ORDER — SODIUM CHLORIDE 0.9 % IV SOLN
INTRAVENOUS | Status: DC
  Administered 2017-08-15: 12:00:00 via INTRAVENOUS

## 2017-08-15 MED ORDER — HEPARIN SODIUM (PORCINE) 1000 UNIT/ML IJ SOLN
INTRAMUSCULAR | Status: AC
  Filled 2017-08-15: qty 1

## 2017-08-15 MED ORDER — HYDROCHLOROTHIAZIDE 25 MG PO TABS
25.0000 mg | ORAL_TABLET | Freq: Every day | ORAL | Status: DC
  Administered 2017-08-15 – 2017-08-18 (×4): 25 mg via ORAL
  Filled 2017-08-15 (×4): qty 1

## 2017-08-15 MED ORDER — SODIUM CHLORIDE 0.9% FLUSH
3.0000 mL | Freq: Two times a day (BID) | INTRAVENOUS | Status: DC
  Administered 2017-08-15: 40 mL via INTRAVENOUS
  Administered 2017-08-16 – 2017-08-18 (×5): 3 mL via INTRAVENOUS

## 2017-08-15 MED ORDER — ALTEPLASE 2 MG IJ SOLR
INTRAMUSCULAR | Status: DC | PRN
  Administered 2017-08-15: 8 mg

## 2017-08-15 MED ORDER — ALTEPLASE 2 MG IJ SOLR
INTRAMUSCULAR | Status: AC
  Filled 2017-08-15: qty 2

## 2017-08-15 MED ORDER — ONDANSETRON HCL 4 MG/2ML IJ SOLN: 4.0000 mg | Freq: Four times a day (QID) | INTRAMUSCULAR | Status: DC | PRN

## 2017-08-15 MED ORDER — HYDROMORPHONE HCL 1 MG/ML IJ SOLN
1.0000 mg | Freq: Once | INTRAMUSCULAR | Status: AC | PRN
  Administered 2017-08-15: 1 mg via INTRAVENOUS

## 2017-08-15 MED ORDER — HEPARIN (PORCINE) IN NACL 100-0.45 UNIT/ML-% IJ SOLN
600.0000 [IU]/h | INTRAMUSCULAR | Status: DC
  Administered 2017-08-15: 600 [IU]/h via INTRAVENOUS

## 2017-08-15 MED ORDER — SODIUM CHLORIDE 0.9% FLUSH
3.0000 mL | INTRAVENOUS | Status: DC | PRN
  Administered 2017-08-17: 3 mL via INTRAVENOUS
  Filled 2017-08-15: qty 3

## 2017-08-15 MED ORDER — HYDROMORPHONE HCL 1 MG/ML IJ SOLN
INTRAMUSCULAR | Status: AC
  Filled 2017-08-15: qty 0.5

## 2017-08-15 MED ORDER — MIDAZOLAM HCL 2 MG/2ML IJ SOLN
INTRAMUSCULAR | Status: DC | PRN
  Administered 2017-08-15 (×2): 1 mg via INTRAVENOUS
  Administered 2017-08-15: 2 mg via INTRAVENOUS
  Administered 2017-08-15: 1 mg via INTRAVENOUS

## 2017-08-15 MED ORDER — METOPROLOL TARTRATE 50 MG PO TABS
50.0000 mg | ORAL_TABLET | Freq: Two times a day (BID) | ORAL | Status: DC
  Administered 2017-08-15 – 2017-08-18 (×6): 50 mg via ORAL
  Filled 2017-08-15 (×6): qty 1

## 2017-08-15 MED ORDER — FAMOTIDINE 20 MG PO TABS: 40.0000 mg | ORAL_TABLET | ORAL | Status: DC | PRN

## 2017-08-15 MED ORDER — HYDRALAZINE HCL 20 MG/ML IJ SOLN
10.0000 mg | INTRAMUSCULAR | Status: DC | PRN
  Administered 2017-08-15: 10 mg via INTRAVENOUS
  Filled 2017-08-15: qty 1

## 2017-08-15 MED ORDER — HEPARIN (PORCINE) IN NACL 100-0.45 UNIT/ML-% IJ SOLN
INTRAMUSCULAR | Status: AC
  Administered 2017-08-15: 600 [IU]/h via INTRAVENOUS
  Filled 2017-08-15: qty 250

## 2017-08-15 MED ORDER — SODIUM CHLORIDE 0.9 % IV SOLN
1.0000 mg/h | Freq: Once | INTRAVENOUS | Status: AC
  Administered 2017-08-15: 1 mg/h via INTRAVENOUS
  Filled 2017-08-15: qty 24

## 2017-08-15 MED ORDER — LABETALOL HCL 5 MG/ML IV SOLN
INTRAVENOUS | Status: AC
  Filled 2017-08-15: qty 4

## 2017-08-15 MED ORDER — HYDROCODONE-ACETAMINOPHEN 5-325 MG PO TABS
1.0000 | ORAL_TABLET | ORAL | Status: DC | PRN
  Administered 2017-08-15: 1 via ORAL
  Administered 2017-08-16 – 2017-08-18 (×7): 2 via ORAL
  Filled 2017-08-15 (×8): qty 2

## 2017-08-15 MED ORDER — DIPHENHYDRAMINE HCL 50 MG/ML IJ SOLN
INTRAMUSCULAR | Status: DC | PRN
  Administered 2017-08-15: 50 mg via INTRAVENOUS

## 2017-08-15 SURGICAL SUPPLY — 24 items
BALLN ULTRVRSE 3X300X130 (BALLOONS) ×4
BALLN ULTRVRSE 5X300X130 (BALLOONS) ×4
BALLOON ULTRVRSE 3X300X130 (BALLOONS) ×2 IMPLANT
BALLOON ULTRVRSE 5X300X130 (BALLOONS) ×2 IMPLANT
CANISTER PENUMBRA MAX (MISCELLANEOUS) ×4 IMPLANT
CATH BEACON 5 .035 65 RIM TIP (CATHETERS) ×4 IMPLANT
CATH BEACON 5 .038 100 VERT TP (CATHETERS) ×4 IMPLANT
CATH CXI SUPP ANG 4FR 135 (MICROCATHETER) ×2 IMPLANT
CATH CXI SUPP ANG 4FR 135CM (MICROCATHETER) ×4
CATH INDIGO 6 ST-TIP 135CM (CATHETERS) ×3 IMPLANT
CATH INFUS 135CMX50CM (CATHETERS) ×4 IMPLANT
CATH PIG 70CM (CATHETERS) ×4 IMPLANT
COVER PROBE U/S 5X48 (MISCELLANEOUS) ×4 IMPLANT
DEVICE PRESTO INFLATION (MISCELLANEOUS) ×4 IMPLANT
GLIDEWIRE ADV .035X260CM (WIRE) ×4 IMPLANT
GUIDEWIRE ADV .018X180CM (WIRE) ×4 IMPLANT
KIT CATH CVC 3 LUMEN 7FR 8IN (MISCELLANEOUS) ×4 IMPLANT
PACK ANGIOGRAPHY (CUSTOM PROCEDURE TRAY) ×4 IMPLANT
SHEATH 6FR 45CM BRITE TIP (SHEATH) ×4 IMPLANT
SHEATH ANL2 6FRX45 HC (SHEATH) ×4 IMPLANT
SHEATH BRITE TIP 5FRX11 (SHEATH) ×4 IMPLANT
TUBING ASPIRATION INDIGO (MISCELLANEOUS) ×3 IMPLANT
TUBING CONTRAST HIGH PRESS 72 (TUBING) ×4 IMPLANT
WIRE J 3MM .035X145CM (WIRE) ×4 IMPLANT

## 2017-08-15 NOTE — Op Note (Signed)
[facility] Percutaneous Study/Intervention Procedural Note   Date of Surgery: 08/15/2017  Surgeon(s):Prescious Hurless   Assistants:none  Pre-operative Diagnosis: PAD with ulceration RLE  Post-operative diagnosis: Same  Procedure(s) Performed: 1. Ultrasound guidance for vascular access left femoral artery 2. Catheter placement into right peroneal artery from left femoral approach 3. Aortogram and selective right lower extremity angiogram 4. Percutaneous transluminal angioplasty of right SFA and popliteal arteries with 5 mm balloon 5. Percutaneous transluminal angioplasty of right TP trunk and peroneal artery with 3 mm balloon  6.  Catheter directed thromobolysis with 8 mg of tpa in the right SFA, popliteal, and peroneal arteries 7. Mechanical thrombectomy with the Penumbra Cat 6 device to the right SFA, popliteal, TP trunk, and peroneal arteries  8.  Placement of an infusion catheter for continued thrombolytic therapy  EBL: 150 cc  Contrast: 75 cc  Fluoro Time: 9.7 minutes  Moderate Conscious Sedation Time: approximately 80 minutes using 5 mg of Versed and 150 mcg of Fentanyl  Indications: Patient is a 66 y.o.male with rest pain and a non-healing ulceration on the right foot. The patient has noninvasive study showing poor, monophasic flow distally. The patient is brought in for angiography for further evaluation and potential treatment. Risks and benefits are discussed and informed consent is obtained  Procedure: The patient was identified and appropriate procedural time out was performed. The patient was then placed supine on the table and prepped and draped in the usual sterile fashion.Moderate conscious sedation was administered during a face to face encounter with the patient throughout the procedure with my supervision of the RN administering medicines and  monitoring the patient's vital signs, pulse oximetry, telemetry and mental status throughout from the start of the procedure until the patient was taken to the recovery room. Ultrasound was used to evaluate the left common femoral artery. It was patent . A digital ultrasound image was acquired. A Seldinger needle was used to access the left common femoral artery under direct ultrasound guidance and a permanent image was performed. A 0.035 J wire was advanced without resistance and a 5Fr sheath was placed. Pigtail catheter was placed into the aorta and an AP aortogram was performed. This demonstrated normal renal arteries and normal aorta and iliac segments without significant stenosis. I then crossed the aortic bifurcation and advanced to the right femoral head. Selective right lower extremity angiogram was then performed. This demonstrated moderately diseased profunda femorus artery distally, but normal proximally.  The SFA was occluded at the origin and did not reconstitute until the tibial vessels and a diseased peroneal and PT artery were seen distally. The patient was systemically heparinized and a 6 [place] Ansell sheath was then placed over the [employer] wire. I then used a Kumpe catheter and the advantage wire to get across the SFA and popliteal lesions and get all the way down into the peroneal artery in the mid segment.  This remained diseased, but did have flow.  I then used a 135 cm/50 cm length infusion catheter and instilled 8 mg of tpa in the right SFA, popliteal, TP trunk and peroneal arteries.  This was allowed to dwell for 20 minutes. I then used the Penumbra Cat 6 catheter and performed mechanical thrombectomy to the right SFA, popliteal, TP trunk, and peroneal arteries.  Several passes were made and a moderate amount of thrombus was returned.  There was now flow in the SFA although several areas of residual thrombus and >50% stenosis remained.  At this point,  the patient was unable to  hold still and imaging became very difficult.  There remained thrombus and stenosis in the distal runoff which was very poor.  I elected to treat the TP trunk and peroneal arteries with angioplasty.  A 3 mm x 30 cm balloon was taken from the mid to distal peroneal artery and up through the TP trunk into the distal popliteal artery.  This was inflated to 8 atm for one minute. The SFA and popliteal artery was treated with two inflations of the 5 mm x 30 cm angioplasty balloon from the BK popliteal artery up to the origin of the SFA.  There SFA and popliteal were much improved although two areas of mild to moderate stenosis and thrombus remained.  The TP trunk and proximal peroneal artery remained nearly occluded with thrombus still.  I elected to terminate the procedure today and place an infusion catheter for continued tpa.  A 130 cm length total, 50 cm working length infusion catheter was parked from the proximal SFA  Down through the popliteal artery and into the proximal peroneal artery.  This was secured in place.  The sheath was secured as well. The patient was taken to the recovery room in stable condition having tolerated the procedure well.  Findings:  Aortogram: This demonstrated normal renal arteries and normal aorta and iliac segments without significant stenosis Right Lower Extremity: moderately diseased profunda femorus artery distally, but normal proximally.  The SFA was occluded at the origin and did not reconstitute until the tibial vessels and a diseased peroneal and PT artery were seen distally.     Disposition: Patient was taken to the recovery room in stable condition having tolerated the procedure well.  Complications: None  [name] 08/15/2017 3:58 PM   This note was created with Dragon Medical transcription system. Any errors in dictation are purely unintentional.

## 2017-08-15 NOTE — Op Note (Signed)
[facility]   PROCEDURE NOTE  PROCEDURE: 1. Left femoral triple lumen central venous catheter placement 2. Left femoral vein cannulation under ultrasound guidance 3. Left ileofemoral venogram 4. Fluoroscopic guidance for placement of a catheter  PRE-OPERATIVE DIAGNOSIS: ischemic leg  POST-OPERATIVE DIAGNOSIS: same as above  SURGEON: [name], MD  ANESTHESIA:  None  ESTIMATED BLOOD LOSS: minimal  FINDING(S): none  SPECIMEN(S):  none  INDICATIONS:   Jordan Mills is a 66 y.o. male who presents with need for venous access. He is going to receive tpa and needs a line for blood draws and infusions. The patient is aware the risks of central venous catheter placement include but are not limited to: bleeding, infection, central venous injury, pneumothorax, possible venous stenosis, possible malpositioning in the venous system, and possible infections related to long-term catheter presence. The patient was aware of these risks and agreed to proceed.  DESCRIPTION: After written informed consent was obtained from the patient and/or family, the patient was placed supine in the hospital bed.  The patient was prepped with chloraprep and draped in the standard fashion for a groin central venous catheter placement. Femoral access is selected as the patient is combative and his groin is already prepped. I anesthesized the cannulation site with 1% lidocaine then under ultrasound guidance, the left femoral vein was cannulated with the 18 gauge needle and a permanent image was recorded.  A J wire was then placed but would not pass beyond about 12-15 cm.  I passed the catheter into the left external iliac vein and then performed a venogram. A >70% stenosis was seen in the common iliac vein.  Using this as a roadmap, I was able to then pass a wire through the stenosis and in the inferior vena cava.  After a skin nick and dilatation, the triple lumen central venous catheter was  placed over the wire and the wire was removed.  Fluoroscopy was used to park the central venous catheter into the IVC above the iliac stenosis. Each port was aspirated and flushed with sterile normal saline.  The catheter was secured in placed with three interrupted stitches of 3-0 Silk tied to the catheter.  The catheter was dressed with sterile dressing  COMPLICATIONS: none apparent  CONDITION: stable  [name] 08/15/2017, 7:54 PM     This note was created with Dragon Medical transcription system. Any errors in dictation are purely unintentional.

## 2017-08-15 NOTE — H&P (Signed)
[facility] History & Physical Update  The patient was interviewed and re-examined.  The patient's previous History and Physical has been reviewed and is unchanged.  There is no change in the plan of care. We plan to proceed with the scheduled procedure.  [name], MD  08/15/2017, 12:30 PM

## 2017-08-16 ENCOUNTER — Inpatient Hospital Stay: Payer: Medicare Other | Admitting: Anesthesiology

## 2017-08-16 ENCOUNTER — Encounter: Payer: Self-pay | Admitting: Vascular Surgery

## 2017-08-16 ENCOUNTER — Encounter
Admission: RE | Disposition: A | Discharge status: Home / Self Care | Payer: Self-pay | Source: Ambulatory Visit | Attending: Vascular Surgery

## 2017-08-16 DIAGNOSIS — I70221 Atherosclerosis of native arteries of extremities with rest pain, right leg: Secondary | ICD-10-CM

## 2017-08-16 HISTORY — PX: LOWER EXTREMITY ANGIOGRAPHY: CATH118251

## 2017-08-16 LAB — CBC
HEMATOCRIT: 30 % — AB (ref 40.0–52.0)
HEMATOCRIT: 29.9 % — AB (ref 40.0–52.0)
HEMOGLOBIN: 10 g/dL — AB (ref 13.0–18.0)
Hemoglobin: 10 g/dL — ABNORMAL LOW (ref 13.0–18.0)
MCH: 28.2 pg (ref 26.0–34.0)
MCH: 28.5 pg (ref 26.0–34.0)
MCHC: 33.3 g/dL (ref 32.0–36.0)
MCHC: 33.3 g/dL (ref 32.0–36.0)
MCV: 84.7 fL (ref 80.0–100.0)
MCV: 85.7 fL (ref 80.0–100.0)
PLATELETS: 325 10*3/uL (ref 150–440)
Platelets: 327 10*3/uL (ref 150–440)
RBC: 3.54 MIL/uL — ABNORMAL LOW (ref 4.40–5.90)
RBC: 3.49 MIL/uL — ABNORMAL LOW (ref 4.40–5.90)
RDW: 15 % — ABNORMAL HIGH (ref 11.5–14.5)
RDW: 15 % — AB (ref 11.5–14.5)
WBC: 17.9 10*3/uL — ABNORMAL HIGH (ref 3.8–10.6)
WBC: 17.5 10*3/uL — ABNORMAL HIGH (ref 3.8–10.6)

## 2017-08-16 LAB — BASIC METABOLIC PANEL
Anion gap: 9 (ref 5–15)
BUN: 14 mg/dL (ref 6–20)
CHLORIDE: 101 mmol/L (ref 101–111)
CO2: 25 mmol/L (ref 22–32)
CREATININE: 1.16 mg/dL (ref 0.61–1.24)
Calcium: 8.8 mg/dL — ABNORMAL LOW (ref 8.9–10.3)
GFR calc non Af Amer: >60 mL/min (ref >60)
Glucose, Bld: 138 mg/dL — ABNORMAL HIGH (ref 65–99)
POTASSIUM: 3.6 mmol/L (ref 3.5–5.1)
Sodium: 135 mmol/L (ref 135–145)

## 2017-08-16 LAB — HEPARIN LEVEL (UNFRACTIONATED)
Heparin Unfractionated: <0.1 IU/mL — ABNORMAL LOW (ref 0.30–0.70)
Heparin Unfractionated: <0.1 IU/mL — ABNORMAL LOW (ref 0.30–0.70)

## 2017-08-16 LAB — FIBRINOGEN
Fibrinogen: 506 mg/dL — ABNORMAL HIGH (ref 210–475)
Fibrinogen: 491 mg/dL — ABNORMAL HIGH (ref 210–475)

## 2017-08-16 SURGERY — LOWER EXTREMITY ANGIOGRAPHY
Anesthesia: General | Laterality: Right

## 2017-08-16 MED ORDER — PROPOFOL 500 MG/50ML IV EMUL
INTRAVENOUS | Status: AC
  Filled 2017-08-16: qty 50

## 2017-08-16 MED ORDER — MIDAZOLAM HCL 5 MG/5ML IJ SOLN
INTRAMUSCULAR | Status: AC
  Filled 2017-08-16: qty 10

## 2017-08-16 MED ORDER — SUCCINYLCHOLINE CHLORIDE 20 MG/ML IJ SOLN
INTRAMUSCULAR | Status: AC
  Filled 2017-08-16: qty 2

## 2017-08-16 MED ORDER — HEPARIN SODIUM (PORCINE) 1000 UNIT/ML IJ SOLN
INTRAMUSCULAR | Status: AC
  Filled 2017-08-16: qty 1

## 2017-08-16 MED ORDER — FENTANYL CITRATE (PF) 100 MCG/2ML IJ SOLN
INTRAMUSCULAR | Status: AC
  Filled 2017-08-16: qty 4

## 2017-08-16 MED ORDER — SODIUM CHLORIDE 0.9% FLUSH
10.0000 mL | Freq: Two times a day (BID) | INTRAVENOUS | Status: DC
  Administered 2017-08-16 – 2017-08-17 (×4): 10 mL

## 2017-08-16 MED ORDER — HEPARIN (PORCINE) IN NACL 2-0.9 UNIT/ML-% IJ SOLN
INTRAMUSCULAR | Status: AC
  Filled 2017-08-16: qty 1000

## 2017-08-16 MED ORDER — PROPOFOL 500 MG/50ML IV EMUL
INTRAVENOUS | Status: DC | PRN
  Administered 2017-08-16: 75 ug/kg/min via INTRAVENOUS

## 2017-08-16 MED ORDER — LIDOCAINE-EPINEPHRINE (PF) 1 %-1:200000 IJ SOLN
INTRAMUSCULAR | Status: AC
  Filled 2017-08-16: qty 30

## 2017-08-16 MED ORDER — SODIUM CHLORIDE 0.9% FLUSH: 10.0000 mL | INTRAVENOUS | Status: DC | PRN

## 2017-08-16 MED ORDER — MIDAZOLAM HCL 2 MG/2ML IJ SOLN
INTRAMUSCULAR | Status: DC | PRN
  Administered 2017-08-16: 2 mg via INTRAVENOUS

## 2017-08-16 MED ORDER — TIROFIBAN (AGGRASTAT) BOLUS VIA INFUSION
25.0000 ug/kg | Freq: Once | INTRAVENOUS | Status: AC
  Administered 2017-08-16: 1565 ug via INTRAVENOUS
  Filled 2017-08-16: qty 32

## 2017-08-16 MED ORDER — HEPARIN SODIUM (PORCINE) 1000 UNIT/ML IJ SOLN
INTRAMUSCULAR | Status: DC | PRN
  Administered 2017-08-16: 3000 [IU] via INTRAVENOUS

## 2017-08-16 MED ORDER — MIDAZOLAM HCL 2 MG/2ML IJ SOLN
INTRAMUSCULAR | Status: AC
  Filled 2017-08-16: qty 2

## 2017-08-16 MED ORDER — FENTANYL CITRATE (PF) 100 MCG/2ML IJ SOLN
INTRAMUSCULAR | Status: DC | PRN
  Administered 2017-08-16: 50 ug via INTRAVENOUS
  Administered 2017-08-16 (×2): 25 ug via INTRAVENOUS

## 2017-08-16 MED ORDER — IOPAMIDOL (ISOVUE-300) INJECTION 61%
INTRAVENOUS | Status: DC | PRN
  Administered 2017-08-16: 85 mL via INTRAVENOUS

## 2017-08-16 MED ORDER — TIROFIBAN HCL IV 12.5 MG/250 ML
0.1500 ug/kg/min | INTRAVENOUS | Status: DC
  Administered 2017-08-16 (×2): 0.15 ug/kg/min via INTRAVENOUS
  Filled 2017-08-16 (×2): qty 100
  Filled 2017-08-16: qty 250

## 2017-08-16 MED ORDER — LIDOCAINE-EPINEPHRINE (PF) 1 %-1:200000 IJ SOLN
INTRAMUSCULAR | Status: DC | PRN
  Administered 2017-08-16: 10 mL via INTRADERMAL

## 2017-08-16 MED ORDER — FENTANYL CITRATE (PF) 100 MCG/2ML IJ SOLN
INTRAMUSCULAR | Status: AC
  Filled 2017-08-16: qty 2

## 2017-08-16 SURGICAL SUPPLY — 15 items
BALLN ULTRVRSE 3X300X150 (BALLOONS) ×2
BALLN ULTRVRSE 3X300X150 OTW (BALLOONS) ×1
BALLN ULTRVRSE 4X150X150 (BALLOONS) ×3
BALLN ULTRVRSE 5X250X130 (BALLOONS) ×3
BALLN ULTRVRSE 6X300X130 (BALLOONS) ×3
CANISTER PENUMBRA MAX (MISCELLANEOUS) ×3
CATH BEACON 5 .038 100 VERT TP (CATHETERS) ×3
CATH INDIGO 6 ST-TIP 135CM (CATHETERS) ×2
DEVICE PRESTO INFLATION (MISCELLANEOUS) ×3
DEVICE STARCLOSE SE CLOSURE (Vascular Products) ×3 IMPLANT
PACK ANGIOGRAPHY (CUSTOM PROCEDURE TRAY) ×3
STENT VIABAHN 6X250X120 (Permanent Stent) ×6 IMPLANT
TUBING ASPIRATION INDIGO (MISCELLANEOUS) ×2
WIRE G V18X300CM (WIRE) ×3
WIRE MAGIC TORQUE 260C (WIRE) ×3

## 2017-08-16 NOTE — Transfer of Care (Signed)
Immediate Anesthesia Transfer of Care Note  Patient: Jordan Mills  Procedure(s) Performed: LOWER EXTREMITY ANGIOGRAPHY (Right )  Patient Location: PACU  Anesthesia Type:General  Level of Consciousness: awake, alert  and oriented  Airway & Oxygen Therapy: Patient Spontanous Breathing  Post-op Assessment: Report given to RN and Post -op Vital signs reviewed and stable  Post vital signs: Reviewed and stable  Last Vitals:  Vitals:   08/16/17 0724 08/16/17 1009  BP: 136/75 (!) 149/79  Pulse: 90 79  Resp: 16 12  Temp: 36.7 C (!) 36.3 C  SpO2: 97% 97%    Last Pain:  Vitals:   08/16/17 0724  TempSrc: Oral  PainSc:       Patients Stated Pain Goal: 4 (58/52/77 8242)  Complications: No apparent anesthesia complications

## 2017-08-16 NOTE — Progress Notes (Signed)
Pt admitted to ICU 11/1 s/p right lower extremity angiogram and mechanical thrombectomy secondary to PAD with rest pain and ulceration of the right leg.  He is currently alert and oriented, nsr on cardiac monitor, lungs clear throughout, bilateral lower extremities warm, 2+ palpable left lower extremity pulse, 2+ doppler right lower extremity pulse, vss, and pain controlled.  PCCM will follow while pt in ICU.  Marda Stalker, [place] Pager [phone] (please enter 7 digits) Central Pager [phone] (please enter 7 digits)  Merton Border, MD PCCM service Mobile [phone] Pager [phone] 08/17/2017 8:10 AM

## 2017-08-16 NOTE — Anesthesia Preprocedure Evaluation (Signed)
Anesthesia Evaluation  Patient identified by MRN, date of birth, ID band Patient awake    Reviewed: Allergy & Precautions, H&P , NPO status , Patient's Chart, lab work & pertinent test results, reviewed documented beta blocker date and time   Airway Mallampati: II   Neck ROM: full    Dental  (+) Poor Dentition, Missing, Loose   Pulmonary neg pulmonary ROS, former smoker,    Pulmonary exam normal        Cardiovascular Exercise Tolerance: Poor hypertension, On Medications + Peripheral Vascular Disease  negative cardio ROS Normal cardiovascular exam Rhythm:regular Rate:Normal     Neuro/Psych negative neurological ROS  negative psych ROS   GI/Hepatic negative GI ROS, Neg liver ROS,   Endo/Other  negative endocrine ROS  Renal/GU negative Renal ROS  negative genitourinary   Musculoskeletal   Abdominal   Peds  Hematology negative hematology ROS (+)   Anesthesia Other Findings Past Medical History: No date: Gout No date: HLD (hyperlipidemia) No date: Hypertension No date: Peripheral vascular disease ([place]) Past Surgical History: 07/19/2017: AMPUTATION TOE; Right     Comment:  Procedure: AMPUTATION TOE/Rt 4th MPJ;  Surgeon: Sharlotte Alamo, DPM;  Location: [facility] ORS;  Service: Podiatry;                Laterality: Right; 07/01/2017: LOWER EXTREMITY ANGIOGRAPHY; Right     Comment:  Procedure: Lower Extremity Angiography;  Surgeon: Algernon Huxley, MD;  Location: [facility];  Service:               Cardiovascular;  Laterality: Right; No date: TONSILLECTOMY BMI    Body Mass Index:  19.25 kg/m     Reproductive/Obstetrics negative OB ROS                             Anesthesia Physical Anesthesia Plan  ASA: III  Anesthesia Plan: General   Post-op Pain Management:    Induction:   PONV Risk Score and Plan: 3 and Ondansetron, Dexamethasone, Midazolam and  Propofol infusion  Airway Management Planned:   Additional Equipment:   Intra-op Plan:   Post-operative Plan:   Informed Consent: I have reviewed the patients History and Physical, chart, labs and discussed the procedure including the risks, benefits and alternatives for the proposed anesthesia with the patient or authorized representative who has indicated his/her understanding and acceptance.   Dental Advisory Given  Plan Discussed with: CRNA  Anesthesia Plan Comments:         Anesthesia Quick Evaluation

## 2017-08-16 NOTE — Anesthesia Post-op Follow-up Note (Signed)
Anesthesia QCDR form completed.        

## 2017-08-16 NOTE — H&P (Signed)
[facility] History & Physical Update  The patient was interviewed and re-examined.  The patient's previous History and Physical has been reviewed and is unchanged.  There is no change in the plan of care. We plan to proceed with the scheduled procedure.  [name], MD  08/16/2017, 8:08 AM

## 2017-08-16 NOTE — Progress Notes (Signed)
This RN escorted patient to Special Procedures w/ Roselyn Reef, NT. Report/patient care transferred to [facility], [place] staff.

## 2017-08-16 NOTE — Op Note (Signed)
Jordan Mills Percutaneous Study/Intervention Procedural Note   Date of Surgery:  08/16/2017  Surgeon(s):Itzell Bendavid   Assistants:none  Pre-operative Diagnosis: PAD with rest Mills and ulceration right leg  Post-operative diagnosis: Same  Procedure(s) Performed: 1. angiogram through existing catheter RLE 2. Catheter placement into right peroneal artery from left femoral approach 3. Mechanical thrombectomy to the right SFA, popliteal artery, tibioperoneal trunk, and peroneal arteries with the penumbra cat 6 catheter 4. Percutaneous transluminal angioplasty of the entire SFA and popliteal arteries with 5 mm diameter angioplasty balloon percutaneous transluminal angioplasty of the peroneal artery 5. Percutaneous transluminal angioplasty to the distal segments of the peroneal artery up through the tibioperoneal trunk with a 3 mm diameter angioplasty balloon and additional inflation in the tibioperoneal trunk and proximal peroneal artery with a 4 mm diameter angioplasty balloon  6.  Viabahn covered stent placement to the entire SFA and the popliteal artery to below the knee with two 6 mm diameter by 25 stents for residual stenosis and thrombus after angioplasty and thrombectomy 7. StarClose closure device left femoral artery  EBL: 500 cc  Contrast: 85 cc  Fluoro Time: 8.1 minutes  Anesthesia: MAC  Indications: Patient is a 66 y.o.male with rest Mills and nonhealing ulceration of the right foot. The patient has been on a continuous infusion of TPA overnight in hopes of improving his perfusion.  The patient is brought in for angiography for further evaluation and potential treatment. Risks and benefits are discussed and informed consent is obtained  Procedure: The patient was identified and appropriate procedural time out was performed. The patient was then placed supine on the  table and prepped and draped in the usual sterile fashion.Moderate conscious sedation was administered during a face to face encounter with the patient throughout the procedure with my supervision of the RN administering medicines and monitoring the patient's vital signs, pulse oximetry, telemetry and mental status throughout from the start of the procedure until the patient was taken to the recovery room.  The existing lytic catheter was removed and a Magic torque wire was placed through the sheath and parked in the peroneal artery.  Selective right lower extremity angiogram was then performed. This demonstrated residual thrombus and minimal flow through the SFA, popliteal arteries, and a large chunk of thrombus in the tibioperoneal trunk and proximal peroneal artery which was occlusive.  At the penumbra cat 6 catheter was then used and multiple passes were made throughout the right SFA, popliteal artery, tibioperoneal trunk, and down all the way into the mid to distal peroneal artery.  Multiple passes were made but minimal improvement was seen.  I then performed angioplasty. The peroneal artery and tibioperoneal trunk were treated with a 3 mm diameter by 30 cm length angioplasty balloon over a 0.018 wire.  The SFA and popliteal arteries were treated with 2 inflations with a 5 mm diameter by 30 cm length angioplasty balloon.  Minimal improvement was seen.  There was near occlusive stenosis in the mid SFA as well as at Hunter's canal and in the above-knee popliteal artery and this was associated with significant residual thrombosis.  I elected to cover this area with a Viabahn covered stent.  A 6 mm diameter by 25 cm length stent was taken from the below-knee popliteal artery up to the mid SFA.  There is also a large chunk of near occlusive thrombus in the proximal SFA.  A second Viabahn stent was started less than a centimeter from the origin of the SFA  and overlapping the lower stent.  These were postdilated with  5 and 6 mm balloons.  At this point, there is no significant residual stenosis within the SFA or popliteal arteries at the level of the stent, but the large chunk of thrombus in the distal popliteal artery and tibioperoneal trunk remained occlusive.  Another pass with the penumbra cat 6 catheter was performed.  Minimal improvement was seen.  I balloon the area with a 4 mm diameter by 15 cm length angioplasty balloon and at this point there was a collateral feeding into the anterior tibial area and even with a catheter placed back in the peroneal artery the flow distally was very sluggish.  The hope is that there was significant spasm and some distal thrombosis that can be treated with anticoagulants, because at this point there is nothing further I can do from an endovascular standpoint to improve his perfusion and he really does not have a bypass target. I elected to terminate the procedure. The sheath was removed and StarClose closure device was deployed in the left femoral artery with excellent hemostatic result. The patient was taken to the recovery room in stable condition having tolerated the procedure well.  Findings:   Right Lower Extremity:  This demonstrated residual thrombus and minimal flow through the SFA, popliteal arteries, and a large chunk of thrombus in the tibioperoneal trunk and proximal peroneal artery which was occlusive.   Disposition: Patient was taken to the recovery room in stable condition having tolerated the procedure well.  Complications: None  Jordan Mills 08/16/2017 9:45 AM   This note was created with Dragon Medical transcription system. Any errors in dictation are purely unintentional.

## 2017-08-17 LAB — BASIC METABOLIC PANEL
Anion gap: 9 (ref 5–15)
BUN: 15 mg/dL (ref 6–20)
CALCIUM: 8.6 mg/dL — AB (ref 8.9–10.3)
CO2: 25 mmol/L (ref 22–32)
CREATININE: 1.19 mg/dL (ref 0.61–1.24)
Chloride: 101 mmol/L (ref 101–111)
GFR calc non Af Amer: >60 mL/min (ref >60)
Glucose, Bld: 119 mg/dL — ABNORMAL HIGH (ref 65–99)
Potassium: 3.7 mmol/L (ref 3.5–5.1)
SODIUM: 135 mmol/L (ref 135–145)

## 2017-08-17 LAB — CBC
HCT: 25.9 % — ABNORMAL LOW (ref 40.0–52.0)
HEMOGLOBIN: 8.6 g/dL — AB (ref 13.0–18.0)
MCH: 28.3 pg (ref 26.0–34.0)
MCHC: 33.2 g/dL (ref 32.0–36.0)
MCV: 85.3 fL (ref 80.0–100.0)
Platelets: 273 10*3/uL (ref 150–440)
RBC: 3.03 MIL/uL — ABNORMAL LOW (ref 4.40–5.90)
RDW: 15.1 % — AB (ref 11.5–14.5)
WBC: 20.2 10*3/uL — AB (ref 3.8–10.6)

## 2017-08-17 MED ORDER — PNEUMOCOCCAL VAC POLYVALENT 25 MCG/0.5ML IJ INJ
0.5000 mL | INJECTION | INTRAMUSCULAR | Status: AC
  Administered 2017-08-18: 0.5 mL via INTRAMUSCULAR
  Filled 2017-08-17: qty 0.5

## 2017-08-17 MED ORDER — MORPHINE SULFATE (PF) 2 MG/ML IV SOLN
INTRAVENOUS | Status: AC
  Filled 2017-08-17: qty 1

## 2017-08-17 MED ORDER — INFLUENZA VAC SPLIT HIGH-DOSE 0.5 ML IM SUSY
0.5000 mL | PREFILLED_SYRINGE | INTRAMUSCULAR | Status: AC
  Administered 2017-08-18: 0.5 mL via INTRAMUSCULAR
  Filled 2017-08-17 (×2): qty 0.5

## 2017-08-17 MED ORDER — MORPHINE SULFATE (PF) 2 MG/ML IV SOLN
2.0000 mg | Freq: Once | INTRAVENOUS | Status: AC
  Administered 2017-08-17: 2 mg via INTRAVENOUS

## 2017-08-17 MED ORDER — APIXABAN 5 MG PO TABS
5.0000 mg | ORAL_TABLET | Freq: Two times a day (BID) | ORAL | Status: DC
  Administered 2017-08-17 – 2017-08-18 (×3): 5 mg via ORAL
  Filled 2017-08-17 (×3): qty 1

## 2017-08-17 MED ORDER — MORPHINE SULFATE (PF) 2 MG/ML IV SOLN
1.0000 mg | INTRAVENOUS | Status: DC | PRN
  Administered 2017-08-18: 1 mg via INTRAVENOUS
  Filled 2017-08-17: qty 1

## 2017-08-17 NOTE — Progress Notes (Signed)
1 Day Post-Op   Subjective/Chief Complaint: Doing Ok. Pain well controlled and mostly in foot at wound.   Objective: Vital signs in last 24 hours: Temp:  [97.9 F (36.6 C)-98.9 F (37.2 C)] 98.4 F (36.9 C) (11/03 0800) Pulse Rate:  [76-105] 97 (11/03 1000) Resp:  [10-24] 14 (11/03 1000) BP: (118-178)/(45-86) 130/60 (11/03 1000) SpO2:  [93 %-99 %] 96 % (11/03 1000) Last BM Date:  (PTA)  Intake/Output from previous day: 11/02 0701 - 11/03 0700 In: 1037.9 [I.V.:1037.9] Out: 2850 [Urine:2350; Blood:500] Intake/Output this shift: Total I/O In: 183.9 [P.O.:150; I.V.:33.9] Out: -   General appearance: alert and no distress Resp: clear to auscultation bilaterally Cardio: regular rate and rhythm, S1, S2 normal, no murmur, click, rub or gallop Extremities: edema trace and warm, right foot ulcers/wounds scant drainage Pulses: Nonpalpable, but foot warm, perfused.  Lab Results:   Recent Labs  08/16/17 0414 08/17/17 0351  WBC 17.5* 20.2*  HGB 10.0* 8.6*  HCT 29.9* 25.9*  PLT 325 273   BMET  Recent Labs  08/16/17 0414 08/17/17 0351  NA 135 135  K 3.6 3.7  CL 101 101  CO2 25 25  GLUCOSE 138* 119*  BUN 14 15  CREATININE 1.16 1.19  CALCIUM 8.8* 8.6*   PT/INR No results for input(s): LABPROT, INR in the last 72 hours. ABG No results for input(s): PHART, HCO3 in the last 72 hours.  Invalid input(s): PCO2, PO2  Studies/Results: No results found.  Anti-infectives: Anti-infectives    Start     Dose/Rate Route Frequency Ordered Stop   08/14/17 2200  ceFAZolin (ANCEF) IVPB 2g/100 mL premix     2 g 200 mL/hr over 30 Minutes Intravenous  Once 08/14/17 2151 08/15/17 1443      Assessment/Plan: s/p Procedure(s): LOWER EXTREMITY ANGIOGRAPHY (Right) D/C Foley  OOB Betadine/dressings to right foot D/C aggrastat Begin Eliquis  Transfer to floor D/C home tomorrow  LOS: 2 days    Jamesetta So A 08/17/2017

## 2017-08-17 NOTE — Anesthesia Postprocedure Evaluation (Signed)
Anesthesia Post Note  Patient: Jordan Mills  Procedure(s) Performed: LOWER EXTREMITY ANGIOGRAPHY (Right )  Patient location during evaluation: ICU Anesthesia Type: General Level of consciousness: awake and alert and oriented Pain management: pain level controlled Vital Signs Assessment: post-procedure vital signs reviewed and stable Respiratory status: spontaneous breathing Cardiovascular status: blood pressure returned to baseline Anesthetic complications: no     Last Vitals:  Vitals:   08/17/17 0500 08/17/17 0600  BP: (!) 120/45 133/63  Pulse: 86 90  Resp: 14 17  Temp:    SpO2: 97% 95%    Last Pain:  Vitals:   08/17/17 0356  TempSrc: Oral  PainSc:                  [name]

## 2017-08-17 NOTE — Progress Notes (Signed)
Patient w/ severe pain at right fourth toe amputation surgical site. Pain increased after wound care and dressing change. (Patient denied pain medication prior to dressing change.) Norco x 2 was administered w/o noted relief after three hours. Dr. Lorenso Courier was contacted. She gave verbal orders w/ read back for Morphine 2 mg IV now, and Morphine 1 mg Q4 PRN for severe pain. Continue Norco as ordered. Will give Morphine 2 mg IV now. Patient and family updated of new pain management plan. They verbalized their understanding of same.

## 2017-08-18 ENCOUNTER — Other Ambulatory Visit: Payer: Self-pay

## 2017-08-18 MED ORDER — APIXABAN 5 MG PO TABS: 5.0000 mg | ORAL_TABLET | Freq: Two times a day (BID) | ORAL | 1 refills | Status: DC

## 2017-08-18 MED ORDER — HYDROCODONE-ACETAMINOPHEN 5-325 MG PO TABS: 1.0000 | ORAL_TABLET | ORAL | 0 refills | Status: DC | PRN

## 2017-08-18 NOTE — Progress Notes (Signed)
Discharge instructions given to and reviewed with patient and his wife.  Both patient and wife verbalized understanding of all discharge instructions including to call for follow up appointment, new prescriptions and when to call the doctor.  RN wheeled patient down to visitors entrance and patient left [facility] with family driving and no distress noted.

## 2017-08-18 NOTE — Discharge Instructions (Signed)
Information on my medicine - ELIQUIS (apixaban)  Why was Eliquis prescribed for you? Eliquis was prescribed for you to reduce the risk of forming blood clots.  What do You need to know about Eliquis ? Take your Eliquis TWICE DAILY - one tablet in the morning and one tablet in the evening with or without food.  It would be best to take the doses about the same time each day.  If you have difficulty swallowing the tablet whole please discuss with your pharmacist how to take the medication safely.  Take Eliquis exactly as prescribed by your doctor and DO NOT stop taking Eliquis without talking to the doctor who prescribed the medication.  Stopping may increase your risk of developing a new clot or stroke.  Refill your prescription before you run out.  After discharge, you should have regular check-up appointments with your healthcare provider that is prescribing your Eliquis.  In the future your dose may need to be changed if your kidney function or weight changes by a significant amount or as you get older.  What do you do if you miss a dose? If you miss a dose, take it as soon as you remember on the same day and resume taking twice daily.  Do not take more than one dose of ELIQUIS at the same time.  Important Safety Information A possible side effect of Eliquis is bleeding. You should call your healthcare provider right away if you experience any of the following: Bleeding from an injury or your nose that does not stop. Unusual colored urine (red or dark brown) or unusual colored stools (red or black). Unusual bruising for unknown reasons. A serious fall or if you hit your head (even if there is no bleeding).  Some medicines may interact with Eliquis and might increase your risk of bleeding or clotting while on Eliquis. To help avoid this, consult your healthcare provider or pharmacist prior to using any new prescription or non-prescription medications, including herbals, vitamins,  non-steroidal anti-inflammatory drugs (NSAIDs) and supplements.  This website has more information on Eliquis (apixaban): www.Eliquis.com.   

## 2017-08-18 NOTE — Discharge Summary (Signed)
Physician Discharge Summary  Patient ID: Jordan Mills MRN: [id number] DOB/AGE: [birth year] 66 y.o.  Admit date: 08/15/2017 Discharge date: 08/18/2017    Admission Diagnoses: Ischemia Right lower extremity/PAD with ulceration  Discharge Diagnoses:  Active Problems:   Ischemic leg   Discharged Condition: good  Hospital Course: Patient admitted for limb threatening ischemia. Underwent  Aortogram and selective right lower extremity angiogram 4. Percutaneous transluminal angioplasty of right SFA and popliteal arteries with 5 mm balloon 5. Percutaneous transluminal angioplasty of right TP trunk and peroneal artery with 3 mm balloon             6.  Catheter directed thromobolysis with 8 mg of tpa in the right SFA, popliteal, and peroneal arteries 7. Mechanical thrombectomy with the Penumbra Cat 6 device to the right SFA, popliteal, TP trunk, and peroneal arteries    Consults: None  Significant Diagnostic Studies: none  Treatments: surgery:  Aortogram and selective right lower extremity angiogram 4. Percutaneous transluminal angioplasty of right SFA and popliteal arteries with 5 mm balloon 5. Percutaneous transluminal angioplasty of right TP trunk and peroneal artery with 3 mm balloon             6.  Catheter directed thromobolysis with 8 mg of tpa in the right SFA, popliteal, and peroneal arteries 7. Mechanical thrombectomy with the Penumbra Cat 6 device to the right SFA, popliteal, TP trunk, and peroneal arteries    Discharge Exam: Blood pressure 118/61, pulse (!) 107, temperature 98.2 F (36.8 C), temperature source Oral, resp. rate (!) 22, height 5\' 11"  (1.803 m), weight 62.6 kg (138 lb), SpO2 97 %. General appearance: alert and no distress Resp: clear to auscultation bilaterally Cardio: regular rate and rhythm, S1, S2 normal, no murmur, click, rub or gallop Extremities: right: warm, tender, right  foot ulceration dry, no erythema,  Incision/Wound: Pulse: warm, nonpalpable pedal pulses Disposition: 01-Home or Self Care  Discharge Instructions    Activity as tolerated - No restrictions   Complete by:  As directed    Call MD for:  redness, tenderness, or signs of infection (pain, swelling, bleeding, redness, odor or green/yellow discharge around incision site)   Complete by:  As directed    Call MD for:  severe or increased pain, loss or decreased feeling  in affected limb(s)   Complete by:  As directed    Call MD for:  temperature >100.5   Complete by:  As directed    Change dressing (specify)   Complete by:  As directed    Dressing change: 1x times per day using dry dressing, 4x4, kerlix.   Driving Restrictions   Complete by:  As directed    Until follow up   Lifting restrictions   Complete by:  As directed    none   Resume previous diet   Complete by:  As directed      Allergies as of 08/18/2017   No Known Allergies     Medication List    TAKE these medications   amLODipine 5 MG tablet Commonly known as:  NORVASC Take 5 mg by mouth 2 (two) times daily.   amoxicillin-clavulanate 875-125 MG tablet Commonly known as:  AUGMENTIN Take 1 tablet by mouth 2 (two) times daily.   apixaban 5 MG Tabs tablet Commonly known as:  ELIQUIS Take 1 tablet (5 mg total) 2 (two) times daily by mouth.   aspirin EC 81 MG tablet Take by mouth.   atorvastatin 10 MG tablet Commonly known as:  LIPITOR Take 1 tablet (10 mg total) by mouth daily.   clopidogrel 75 MG tablet Commonly known as:  PLAVIX Take 1 tablet (75 mg total) by mouth daily.   HYDROcodone-acetaminophen 5-325 MG tablet Commonly known as:  NORCO/VICODIN Take 1-2 tablets by mouth every 4 (four) hours as needed for moderate pain.   lisinopril-hydrochlorothiazide 20-12.5 MG tablet Commonly known as:  PRINZIDE,ZESTORETIC Take 2 tablets by mouth daily.   metoprolol tartrate 50 MG tablet Commonly known as:   LOPRESSOR Take 50 mg by mouth 2 (two) times daily.            Discharge Care Instructions  (From admission, onward)        Start     Ordered   08/18/17 0000  Change dressing (specify)    Comments:  Dressing change: 1x times per day using dry dressing, 4x4, kerlix.   08/18/17 0853       Signed: Jamesetta So A 08/18/2017, 8:53 AM

## 2017-08-19 ENCOUNTER — Telehealth (INDEPENDENT_AMBULATORY_CARE_PROVIDER_SITE_OTHER): Payer: Self-pay | Admitting: Vascular Surgery

## 2017-08-19 NOTE — Telephone Encounter (Signed)
Pt granddaughter called to make 2 week follow up post discharge. Granddaughter states that pt was prescribed Eliquis but was unable to afford it. Does Dr. Lucky Cowboy want patient to continue with Plavix. Please advise ,thank you!  Call Brandy @ [phone]

## 2017-08-19 NOTE — Telephone Encounter (Signed)
Called the patient's granddaughter to let her know that there will be samples of the Eliquis. The Plavix should be restarted or continued in two weeks are around 09/03/17. The patient's wife will be by later this afternoon to pick-up the samples of Eliquis.

## 2017-08-20 DIAGNOSIS — I739 Peripheral vascular disease, unspecified: Secondary | ICD-10-CM | POA: Diagnosis not present

## 2017-08-20 DIAGNOSIS — T8743 Infection of amputation stump, right lower extremity: Secondary | ICD-10-CM | POA: Diagnosis not present

## 2017-08-20 DIAGNOSIS — T8781 Dehiscence of amputation stump: Secondary | ICD-10-CM | POA: Diagnosis not present

## 2017-08-20 DIAGNOSIS — I10 Essential (primary) hypertension: Secondary | ICD-10-CM | POA: Diagnosis not present

## 2017-08-20 DIAGNOSIS — B9689 Other specified bacterial agents as the cause of diseases classified elsewhere: Secondary | ICD-10-CM | POA: Diagnosis not present

## 2017-08-25 ENCOUNTER — Emergency Department: Payer: Medicare Other

## 2017-08-25 ENCOUNTER — Other Ambulatory Visit: Payer: Self-pay

## 2017-08-25 ENCOUNTER — Inpatient Hospital Stay
Admission: EM | Admit: 2017-08-25 | Discharge: 2017-09-02 | DRG: 475 | Disposition: A | Discharge status: Home Health | Payer: Medicare Other | Attending: Vascular Surgery | Admitting: Vascular Surgery

## 2017-08-25 ENCOUNTER — Encounter: Payer: Self-pay | Admitting: Emergency Medicine

## 2017-08-25 DIAGNOSIS — Y835 Amputation of limb(s) as the cause of abnormal reaction of the patient, or of later complication, without mention of misadventure at the time of the procedure: Secondary | ICD-10-CM | POA: Diagnosis present

## 2017-08-25 DIAGNOSIS — I129 Hypertensive chronic kidney disease with stage 1 through stage 4 chronic kidney disease, or unspecified chronic kidney disease: Secondary | ICD-10-CM | POA: Diagnosis present

## 2017-08-25 DIAGNOSIS — Z79899 Other long term (current) drug therapy: Secondary | ICD-10-CM | POA: Diagnosis not present

## 2017-08-25 DIAGNOSIS — E785 Hyperlipidemia, unspecified: Secondary | ICD-10-CM | POA: Diagnosis present

## 2017-08-25 DIAGNOSIS — I771 Stricture of artery: Secondary | ICD-10-CM | POA: Diagnosis present

## 2017-08-25 DIAGNOSIS — D649 Anemia, unspecified: Secondary | ICD-10-CM | POA: Diagnosis not present

## 2017-08-25 DIAGNOSIS — N189 Chronic kidney disease, unspecified: Secondary | ICD-10-CM | POA: Diagnosis not present

## 2017-08-25 DIAGNOSIS — T8789 Other complications of amputation stump: Principal | ICD-10-CM | POA: Diagnosis present

## 2017-08-25 DIAGNOSIS — I70261 Atherosclerosis of native arteries of extremities with gangrene, right leg: Secondary | ICD-10-CM | POA: Diagnosis not present

## 2017-08-25 DIAGNOSIS — L98499 Non-pressure chronic ulcer of skin of other sites with unspecified severity: Secondary | ICD-10-CM

## 2017-08-25 DIAGNOSIS — I739 Peripheral vascular disease, unspecified: Secondary | ICD-10-CM | POA: Diagnosis present

## 2017-08-25 DIAGNOSIS — I70269 Atherosclerosis of native arteries of extremities with gangrene, unspecified extremity: Secondary | ICD-10-CM | POA: Diagnosis present

## 2017-08-25 DIAGNOSIS — Z95820 Peripheral vascular angioplasty status with implants and grafts: Secondary | ICD-10-CM | POA: Diagnosis not present

## 2017-08-25 DIAGNOSIS — Z7982 Long term (current) use of aspirin: Secondary | ICD-10-CM

## 2017-08-25 DIAGNOSIS — R402413 Glasgow coma scale score 13-15, at hospital admission: Secondary | ICD-10-CM | POA: Diagnosis present

## 2017-08-25 DIAGNOSIS — Z87891 Personal history of nicotine dependence: Secondary | ICD-10-CM | POA: Diagnosis not present

## 2017-08-25 DIAGNOSIS — I998 Other disorder of circulatory system: Secondary | ICD-10-CM | POA: Diagnosis not present

## 2017-08-25 DIAGNOSIS — L03119 Cellulitis of unspecified part of limb: Secondary | ICD-10-CM | POA: Diagnosis present

## 2017-08-25 DIAGNOSIS — I10 Essential (primary) hypertension: Secondary | ICD-10-CM | POA: Diagnosis not present

## 2017-08-25 DIAGNOSIS — Z7902 Long term (current) use of antithrombotics/antiplatelets: Secondary | ICD-10-CM | POA: Diagnosis not present

## 2017-08-25 DIAGNOSIS — N179 Acute kidney failure, unspecified: Secondary | ICD-10-CM | POA: Diagnosis present

## 2017-08-25 DIAGNOSIS — L97519 Non-pressure chronic ulcer of other part of right foot with unspecified severity: Secondary | ICD-10-CM | POA: Diagnosis present

## 2017-08-25 DIAGNOSIS — I96 Gangrene, not elsewhere classified: Secondary | ICD-10-CM | POA: Diagnosis not present

## 2017-08-25 DIAGNOSIS — M79671 Pain in right foot: Secondary | ICD-10-CM | POA: Diagnosis not present

## 2017-08-25 DIAGNOSIS — Z89421 Acquired absence of other right toe(s): Secondary | ICD-10-CM

## 2017-08-25 DIAGNOSIS — Z7901 Long term (current) use of anticoagulants: Secondary | ICD-10-CM

## 2017-08-25 LAB — CBC WITH DIFFERENTIAL/PLATELET
BASOS ABS: 0.1 10*3/uL (ref 0–0.1)
BASOS PCT: 1 %
EOS ABS: 0.1 10*3/uL (ref 0–0.7)
Eosinophils Relative: 1 %
HCT: 24.8 % — ABNORMAL LOW (ref 40.0–52.0)
HEMOGLOBIN: 8.2 g/dL — AB (ref 13.0–18.0)
Lymphocytes Relative: 21 %
Lymphs Abs: 3.6 10*3/uL (ref 1.0–3.6)
MCH: 27.8 pg (ref 26.0–34.0)
MCHC: 32.9 g/dL (ref 32.0–36.0)
MCV: 84.6 fL (ref 80.0–100.0)
Monocytes Absolute: 1.6 10*3/uL — ABNORMAL HIGH (ref 0.2–1.0)
Monocytes Relative: 9 %
NEUTROS ABS: 11.9 10*3/uL — AB (ref 1.4–6.5)
NEUTROS PCT: 68 %
PLATELETS: 679 10*3/uL — AB (ref 150–440)
RBC: 2.93 MIL/uL — ABNORMAL LOW (ref 4.40–5.90)
RDW: 14.4 % (ref 11.5–14.5)
WBC: 17.3 10*3/uL — AB (ref 3.8–10.6)

## 2017-08-25 LAB — APTT: aPTT: 62 seconds — ABNORMAL HIGH (ref 24–36)

## 2017-08-25 LAB — URINALYSIS, ROUTINE W REFLEX MICROSCOPIC
Bacteria, UA: NONE SEEN
Bilirubin Urine: NEGATIVE
GLUCOSE, UA: NEGATIVE mg/dL
Ketones, ur: NEGATIVE mg/dL
Leukocytes, UA: NEGATIVE
Nitrite: NEGATIVE
PH: 6 (ref 5.0–8.0)
Protein, ur: NEGATIVE mg/dL
Specific Gravity, Urine: 1.01 (ref 1.005–1.030)

## 2017-08-25 LAB — PROTIME-INR
INR: 2.01
Prothrombin Time: 22.6 seconds — ABNORMAL HIGH (ref 11.4–15.2)

## 2017-08-25 LAB — BASIC METABOLIC PANEL
ANION GAP: 13 (ref 5–15)
BUN: 59 mg/dL — ABNORMAL HIGH (ref 6–20)
CALCIUM: 9.4 mg/dL (ref 8.9–10.3)
CO2: 23 mmol/L (ref 22–32)
CREATININE: 2.12 mg/dL — AB (ref 0.61–1.24)
Chloride: 101 mmol/L (ref 101–111)
GFR, EST AFRICAN AMERICAN: 35 mL/min — AB (ref >60)
GFR, EST NON AFRICAN AMERICAN: 30 mL/min — AB (ref >60)
Glucose, Bld: 128 mg/dL — ABNORMAL HIGH (ref 65–99)
Potassium: 4 mmol/L (ref 3.5–5.1)
SODIUM: 137 mmol/L (ref 135–145)

## 2017-08-25 LAB — TYPE AND SCREEN
ABO/RH(D): O POS
ANTIBODY SCREEN: NEGATIVE

## 2017-08-25 LAB — SURGICAL PCR SCREEN
MRSA, PCR: NEGATIVE
Staphylococcus aureus: NEGATIVE

## 2017-08-25 MED ORDER — MAGNESIUM CITRATE PO SOLN
1.0000 | Freq: Once | ORAL | Status: AC
  Administered 2017-08-25: 1 via ORAL
  Filled 2017-08-25: qty 296

## 2017-08-25 MED ORDER — HYDRALAZINE HCL 20 MG/ML IJ SOLN: 5.0000 mg | INTRAMUSCULAR | Status: DC | PRN

## 2017-08-25 MED ORDER — HEPARIN BOLUS VIA INFUSION
3800.0000 [IU] | Freq: Once | INTRAVENOUS | Status: AC
  Administered 2017-08-25: 3800 [IU] via INTRAVENOUS
  Filled 2017-08-25: qty 3800

## 2017-08-25 MED ORDER — VANCOMYCIN HCL IN DEXTROSE 1-5 GM/200ML-% IV SOLN
1000.0000 mg | Freq: Once | INTRAVENOUS | Status: AC
  Administered 2017-08-25: 1000 mg via INTRAVENOUS
  Filled 2017-08-25: qty 200

## 2017-08-25 MED ORDER — PANTOPRAZOLE SODIUM 40 MG PO TBEC
40.0000 mg | DELAYED_RELEASE_TABLET | Freq: Every day | ORAL | Status: DC
  Administered 2017-08-25 – 2017-08-28 (×4): 40 mg via ORAL
  Filled 2017-08-25 (×4): qty 1

## 2017-08-25 MED ORDER — ATORVASTATIN CALCIUM 10 MG PO TABS
10.0000 mg | ORAL_TABLET | Freq: Every day | ORAL | Status: DC
Start: 2017-08-25 — End: 2017-09-02
  Administered 2017-08-25 – 2017-09-01 (×8): 10 mg via ORAL
  Filled 2017-08-25 (×8): qty 1

## 2017-08-25 MED ORDER — HYDROCHLOROTHIAZIDE 12.5 MG PO CAPS
12.5000 mg | ORAL_CAPSULE | Freq: Every day | ORAL | Status: DC
  Administered 2017-08-26 – 2017-09-02 (×7): 12.5 mg via ORAL
  Filled 2017-08-25 (×7): qty 1

## 2017-08-25 MED ORDER — LABETALOL HCL 5 MG/ML IV SOLN: 10.0000 mg | INTRAVENOUS | Status: DC | PRN

## 2017-08-25 MED ORDER — ONDANSETRON HCL 4 MG/2ML IJ SOLN
4.0000 mg | Freq: Once | INTRAMUSCULAR | Status: AC
  Administered 2017-08-25: 4 mg via INTRAVENOUS
  Filled 2017-08-25: qty 2

## 2017-08-25 MED ORDER — GUAIFENESIN-DM 100-10 MG/5ML PO SYRP: 15.0000 mL | ORAL_SOLUTION | ORAL | Status: DC | PRN

## 2017-08-25 MED ORDER — ASPIRIN EC 81 MG PO TBEC
81.0000 mg | DELAYED_RELEASE_TABLET | Freq: Every day | ORAL | Status: DC
  Administered 2017-08-26 – 2017-09-02 (×7): 81 mg via ORAL
  Filled 2017-08-25 (×7): qty 1

## 2017-08-25 MED ORDER — PHENOL 1.4 % MT LIQD
1.0000 | OROMUCOSAL | Status: DC | PRN
  Filled 2017-08-25: qty 177

## 2017-08-25 MED ORDER — SODIUM CHLORIDE 0.9 % IV BOLUS (SEPSIS)
1000.0000 mL | Freq: Once | INTRAVENOUS | Status: AC
  Administered 2017-08-25: 1000 mL via INTRAVENOUS

## 2017-08-25 MED ORDER — LISINOPRIL-HYDROCHLOROTHIAZIDE 20-12.5 MG PO TABS: 2.0000 | ORAL_TABLET | Freq: Every day | ORAL | Status: DC

## 2017-08-25 MED ORDER — POTASSIUM CHLORIDE CRYS ER 20 MEQ PO TBCR: 20.0000 meq | EXTENDED_RELEASE_TABLET | Freq: Once | ORAL | Status: DC

## 2017-08-25 MED ORDER — DOCUSATE SODIUM 50 MG/5ML PO LIQD
50.0000 mg | Freq: Every day | ORAL | Status: DC
  Administered 2017-08-26 – 2017-09-02 (×7): 50 mg via ORAL
  Filled 2017-08-25 (×9): qty 10

## 2017-08-25 MED ORDER — HEPARIN (PORCINE) IN NACL 100-0.45 UNIT/ML-% IJ SOLN
1100.0000 [IU]/h | INTRAMUSCULAR | Status: DC
  Administered 2017-08-25: 1100 [IU]/h via INTRAVENOUS
  Filled 2017-08-25: qty 250

## 2017-08-25 MED ORDER — LISINOPRIL 20 MG PO TABS
20.0000 mg | ORAL_TABLET | Freq: Every day | ORAL | Status: DC
  Administered 2017-08-26 – 2017-09-02 (×7): 20 mg via ORAL
  Filled 2017-08-25 (×7): qty 1

## 2017-08-25 MED ORDER — MORPHINE SULFATE (PF) 2 MG/ML IV SOLN
2.0000 mg | INTRAVENOUS | Status: DC | PRN
  Administered 2017-08-25 – 2017-08-30 (×13): 2 mg via INTRAVENOUS
  Filled 2017-08-25 (×13): qty 1

## 2017-08-25 MED ORDER — VANCOMYCIN HCL IN DEXTROSE 1-5 GM/200ML-% IV SOLN
1000.0000 mg | INTRAVENOUS | Status: DC
  Administered 2017-08-26 – 2017-08-27 (×2): 1000 mg via INTRAVENOUS
  Filled 2017-08-25 (×2): qty 200

## 2017-08-25 MED ORDER — ONDANSETRON HCL 4 MG/2ML IJ SOLN: 4.0000 mg | Freq: Four times a day (QID) | INTRAMUSCULAR | Status: DC | PRN

## 2017-08-25 MED ORDER — DEXTROSE-NACL 5-0.45 % IV SOLN
INTRAVENOUS | Status: DC
  Administered 2017-08-25 – 2017-08-27 (×3): via INTRAVENOUS

## 2017-08-25 MED ORDER — PIPERACILLIN-TAZOBACTAM 3.375 G IVPB 30 MIN
3.3750 g | Freq: Once | INTRAVENOUS | Status: AC
  Administered 2017-08-25: 3.375 g via INTRAVENOUS
  Filled 2017-08-25: qty 50

## 2017-08-25 MED ORDER — HYDROCODONE-ACETAMINOPHEN 5-325 MG PO TABS
1.0000 | ORAL_TABLET | ORAL | Status: DC | PRN
  Administered 2017-08-25 (×2): 1 via ORAL
  Administered 2017-08-26 (×2): 2 via ORAL
  Filled 2017-08-25: qty 1
  Filled 2017-08-25: qty 2
  Filled 2017-08-25: qty 1
  Filled 2017-08-25: qty 2

## 2017-08-25 MED ORDER — METOPROLOL TARTRATE 50 MG PO TABS
50.0000 mg | ORAL_TABLET | Freq: Two times a day (BID) | ORAL | Status: DC
  Administered 2017-08-25 – 2017-09-02 (×16): 50 mg via ORAL
  Filled 2017-08-25 (×16): qty 1

## 2017-08-25 MED ORDER — AMLODIPINE BESYLATE 5 MG PO TABS
5.0000 mg | ORAL_TABLET | Freq: Two times a day (BID) | ORAL | Status: DC
  Administered 2017-08-25 – 2017-09-02 (×15): 5 mg via ORAL
  Filled 2017-08-25 (×15): qty 1

## 2017-08-25 MED ORDER — METOPROLOL TARTRATE 5 MG/5ML IV SOLN: 2.0000 mg | INTRAVENOUS | Status: DC | PRN

## 2017-08-25 MED ORDER — ALUM & MAG HYDROXIDE-SIMETH 200-200-20 MG/5ML PO SUSP: 15.0000 mL | ORAL | Status: DC | PRN

## 2017-08-25 MED ORDER — MORPHINE SULFATE (PF) 2 MG/ML IV SOLN
2.0000 mg | Freq: Once | INTRAVENOUS | Status: AC
  Administered 2017-08-25: 2 mg via INTRAVENOUS
  Filled 2017-08-25: qty 1

## 2017-08-25 MED ORDER — VANCOMYCIN HCL 10 G IV SOLR: 1.0000 g | Freq: Once | INTRAVENOUS | Status: DC

## 2017-08-25 NOTE — Progress Notes (Addendum)
ANTICOAGULATION CONSULT NOTE - Initial Consult  Pharmacy Consult for Heparin  Indication: DVT  No Known Allergies  Patient Measurements: Height: 5\' 7"  (170.2 cm) Weight: 138 lb (62.6 kg) IBW/kg (Calculated) : 66.1 Heparin Dosing Weight: 62.6 kg   Vital Signs: Temp: 98.1 F (36.7 C) (11/11 0856) Temp Source: Oral (11/11 0856) BP: 122/76 (11/11 1400) Pulse Rate: 94 (11/11 1415)  Labs: Recent Labs    08/25/17 0942  HGB 8.2*  HCT 24.8*  PLT 679*  CREATININE 2.12*    Estimated Creatinine Clearance: 29.5 mL/min (A) (by C-G formula based on SCr of 2.12 mg/dL (H)).   Medical History: Past Medical History:  Diagnosis Date  . Gout   . HLD (hyperlipidemia)   . Hypertension   . Peripheral vascular disease ([facility])     Medications:  Scheduled:  . amLODipine  5 mg Oral BID  . aspirin EC  81 mg Oral Daily  . atorvastatin  10 mg Oral q1800  . [START ON 08/26/2017] docusate  50 mg Oral Daily  . heparin  3,800 Units Intravenous Once  . lisinopril  20 mg Oral Daily   And  . hydrochlorothiazide  12.5 mg Oral Daily  . magnesium citrate  1 Bottle Oral Once  . metoprolol tartrate  50 mg Oral BID  . pantoprazole  40 mg Oral Daily  . potassium chloride  20-40 mEq Oral Once    Assessment: Pharmacy consulted to dose heparin in this 66 year old male with DVT. No prior anticoag noted.  CrCl = 26.7 ml/min   Goal of Therapy:  Heparin level 0.3-0.7 units/ml Monitor platelets by anticoagulation protocol: Yes   Plan:  Will order Heparin 3800 units IV X 1 bolus followed by heparin gtt to start at @ 1100 units/hr. Will check HL 8 hrs after start of drip on 11/12 @ 00:00.   Betty Daidone D 08/25/2017,3:12 PM

## 2017-08-25 NOTE — H&P (Signed)
Jordan Mills is an 66 y.o. male.   Chief Complaint: Right foot gangrene/Ischemia HPI: Dr. Sharmaine Base well known to service. Recently discharged after extensive endovascular intervention to improve perfusion to the right foot- Thrombectomy, angioplasty and stent placements on 08/16/2017. The patient had gangrene and a nonhealing right 4th toe amputation. Despite aggressive intervention, his run off was extremely poor. He was discharged home on Eliquis, Plavix and ASA. Unfortunately, he presents today with progressive severe pain, gangrene and cellulitis of the foot and evidence of progressive ischemia.  Past Medical History:  Diagnosis Date  . Gout   . HLD (hyperlipidemia)   . Hypertension   . Peripheral vascular disease [facility])     Past Surgical History:  Procedure Laterality Date  . TONSILLECTOMY      Family History  Problem Relation Age of Onset  . Hypertension Mother   . Hyperlipidemia Mother   . Diabetes Mother   . Stroke Mother   . Lung cancer Sister    Social History:  reports that he quit smoking about 2 months ago. His smoking use included cigarettes. He smoked 0.20 packs per day. he has never used smokeless tobacco. He reports that he does not drink alcohol or use drugs.  Allergies: No Known Allergies   (Not in a hospital admission)  Results for orders placed or performed during the hospital encounter of 08/25/17 (from the past 48 hour(s))  Basic metabolic panel     Status: Abnormal   Collection Time: 08/25/17  9:42 AM  Result Value Ref Range   Sodium 137 135 - 145 mmol/L   Potassium 4.0 3.5 - 5.1 mmol/L   Chloride 101 101 - 111 mmol/L   CO2 23 22 - 32 mmol/L   Glucose, Bld 128 (H) 65 - 99 mg/dL   BUN 59 (H) 6 - 20 mg/dL   Creatinine, Ser 2.12 (H) 0.61 - 1.24 mg/dL   Calcium 9.4 8.9 - 10.3 mg/dL   GFR calc non Af Amer 30 (L) >60 mL/min   GFR calc Af Amer 35 (L) >60 mL/min    Comment: (NOTE) The eGFR has been calculated using the CKD EPI  equation. This calculation has not been validated in all clinical situations. eGFR's persistently <60 mL/min signify possible Chronic Kidney Disease.    Anion gap 13 5 - 15  CBC with Differential     Status: Abnormal   Collection Time: 08/25/17  9:42 AM  Result Value Ref Range   WBC 17.3 (H) 3.8 - 10.6 K/uL   RBC 2.93 (L) 4.40 - 5.90 MIL/uL   Hemoglobin 8.2 (L) 13.0 - 18.0 g/dL   HCT 24.8 (L) 40.0 - 52.0 %   MCV 84.6 80.0 - 100.0 fL   MCH 27.8 26.0 - 34.0 pg   MCHC 32.9 32.0 - 36.0 g/dL   RDW 14.4 11.5 - 14.5 %   Platelets 679 (H) 150 - 440 K/uL   Neutrophils Relative % 68 %   Neutro Abs 11.9 (H) 1.4 - 6.5 K/uL   Lymphocytes Relative 21 %   Lymphs Abs 3.6 1.0 - 3.6 K/uL   Monocytes Relative 9 %   Monocytes Absolute 1.6 (H) 0.2 - 1.0 K/uL   Eosinophils Relative 1 %   Eosinophils Absolute 0.1 0 - 0.7 K/uL   Basophils Relative 1 %   Basophils Absolute 0.1 0 - 0.1 K/uL   Dg Foot Complete Right  Result Date: 08/25/2017 CLINICAL DATA:  Pain EXAM: RIGHT FOOT COMPLETE - 3+ VIEW  COMPARISON:  None. FINDINGS: Frontal, oblique, and lateral views were obtained. The patient has had amputation at the level of the fourth MTP joint with absence of the fourth digit beyond the level of the metatarsal. There is no acute fracture or dislocation. Joint spaces appear normal. No erosive change or bony destruction. There are a few small scattered foci of arterial vascular calcification. IMPRESSION: Status post amputation of the fourth digit at the level of the fourth MTP joint. No fracture or dislocation. Joint spaces appear normal. No erosive change or bony destruction. Occasional scattered foci of arterial vascular calcification. Electronically Signed   By: Lowella Grip III M.D.   On: 08/25/2017 10:09    Review of Systems  Constitutional: Positive for malaise/fatigue. Negative for fever.  HENT: Negative.   Respiratory: Negative for cough and shortness of breath.   Cardiovascular: Negative for  chest pain and palpitations.  Gastrointestinal: Positive for constipation. Negative for heartburn and vomiting.  Genitourinary: Negative.   Musculoskeletal: Negative.        Right Foot Pain  Skin: Negative.   Neurological: Negative.  Negative for dizziness and headaches.  All other systems reviewed and are negative.   Blood pressure 140/72, pulse 86, temperature 98.1 F (36.7 C), temperature source Oral, resp. rate 13, height '5\' 7"'  (1.702 m), weight 62.6 kg (138 lb), SpO2 99 %. Physical Exam  Nursing note and vitals reviewed. Constitutional: He is oriented to person, place, and time. He appears well-developed.  HENT:  Head: Normocephalic.  Neck: Normal range of motion. Neck supple.  Cardiovascular: Normal rate and regular rhythm.  Right Foot: No pedal pulses  Respiratory: Effort normal and breath sounds normal.  GI: Soft. Bowel sounds are normal. He exhibits no distension. There is no tenderness.  Musculoskeletal: He exhibits edema and tenderness.  Right Foot gangrene- 3-5 toes, cellulitis  Neurological: He is alert and oriented to person, place, and time.  Skin: There is erythema.     Assessment/Plan Gangrene and Ischemia of right foot.  There are no further revascularization options- endovascular or open available for this gentleman.  After extensive discussion with the patient and his family I recommended a Below Knee amputation to be performed this admission.  Understanding expressed.  I will begin Heparin therapy to maintain the SFA and Popliteal segments until BKA performed.  Evaristo Bury, MD 08/25/2017, 11:56 AM

## 2017-08-25 NOTE — ED Notes (Signed)
This and MD to bedside at this time.

## 2017-08-25 NOTE — ED Provider Notes (Addendum)
[facility] Emergency Department Provider Note ____________________________________________   I have reviewed the triage vital signs and the triage nursing note.  HISTORY  Chief Complaint Foot Problem   Historian Patient, family -spouse? And granddaughter  HPI Jordan Mills is a 66 y.o. male with pvd, history of right fourth toe amputation with podiatry, Dr. Caryl Comes on 07/19/17, followed by same side right, ischemic leg with Vascular surgery intervention (Dr. Lucky Cowboy) on 11/4 of :  Percutaneous transluminal angioplasty ofright SFA and popliteal arteries with 5 mm balloon  Percutaneous transluminal angioplasty of right TP trunk and peroneal artery with 3 mm balloon Catheter directed thromobolysis with 8 mg of tpa in the right SFA, popliteal, and peroneal arteries Mechanical thrombectomy with the Penumbra Cat 6 device to the right SFA, popliteal, TP trunk, and peroneal arteries  Patient states that he went home about a week and half ago.  This past week he was seen by home health nurse on Tuesday.  He was seen by Dr. Caryl Comes on this past Friday, where and it was noted that the third and fifth toes were dusky and dark and with decreased sensation, and patient was given instructions that if pain worsened, or if he developed swelling or fevers or signs of systemic infection to come to the ER.  Granddaughter took a look at the foot this morning and noted it was now showing complete black discoloration to the third and fifth toes and called the on-call vascular surgeon, Dr. Lorenso Courier who did indicate to go ahead and bring the patient to the ER.  Pain is moderate to severe and is located in the right foot almost up to the ankle.        Past Medical History:  Diagnosis Date  . Gout   . HLD (hyperlipidemia)   . Hypertension   . Peripheral vascular disease [facility])     Patient Active Problem List   Diagnosis Date Noted  . Ischemic leg 08/15/2017  . HLD  (hyperlipidemia) 07/16/2017  . Hypertension 06/28/2017  . Tobacco use disorder 06/28/2017  . Atherosclerosis of native arteries of the extremities with ulceration ([place]) 06/28/2017    Past Surgical History:  Procedure Laterality Date  . TONSILLECTOMY      Prior to Admission medications   Medication Sig Start Date End Date Taking? Authorizing Provider  amLODipine (NORVASC) 5 MG tablet Take 5 mg by mouth 2 (two) times daily.    [provider]  amoxicillin-clavulanate (AUGMENTIN) 875-125 MG tablet Take 1 tablet by mouth 2 (two) times daily. 08/07/17   [provider]  apixaban (ELIQUIS) 5 MG TABS tablet Take 1 tablet (5 mg total) 2 (two) times daily by mouth. 08/18/17   Esco, Mariann Barter, MD  aspirin EC 81 MG tablet Take by mouth.    [provider]  atorvastatin (LIPITOR) 10 MG tablet Take 1 tablet (10 mg total) by mouth daily. 07/01/17 07/01/18  Algernon Huxley, MD  clopidogrel (PLAVIX) 75 MG tablet Take 1 tablet (75 mg total) by mouth daily. 07/01/17   Algernon Huxley, MD  HYDROcodone-acetaminophen (NORCO/VICODIN) 5-325 MG tablet Take 1-2 tablets every 4 (four) hours as needed for up to 10 days by mouth for moderate pain. 08/18/17 08/28/17  Esco, Mariann Barter, MD  lisinopril-hydrochlorothiazide (PRINZIDE,ZESTORETIC) 20-12.5 MG tablet Take 2 tablets by mouth daily.    [provider]  metoprolol (LOPRESSOR) 50 MG tablet Take 50 mg by mouth 2 (two) times daily.    [provider]  No Known Allergies  Family History  Problem Relation Age of Onset  . Hypertension Mother   . Hyperlipidemia Mother   . Diabetes Mother   . Stroke Mother   . Lung cancer Sister     Social History Social History   Tobacco Use  . Smoking status: Former Smoker    Packs/day: 0.20    Types: Cigarettes    Last attempt to quit: 06/13/2017    Years since quitting: 0.2  . Smokeless tobacco: Never Used  Substance Use Topics  . Alcohol use: No  . Drug use: No    Review of  Systems  Constitutional: Negative for fever. Eyes: Negative for visual changes. ENT: Negative for sore throat. Cardiovascular: Negative for chest pain. Respiratory: Negative for shortness of breath. Gastrointestinal: Negative for abdominal pain, vomiting and diarrhea. Genitourinary: Negative for dysuria. Musculoskeletal: Negative for back pain. Skin: Black discoloration to the right third and fifth toes.  Redness and swelling to the right foot. Neurological: Negative for headache.  ____________________________________________   PHYSICAL EXAM:  VITAL SIGNS: ED Triage Vitals  Enc Vitals Group     BP 08/25/17 0856 133/71     Pulse Rate 08/25/17 0856 90     Resp 08/25/17 0856 13     Temp 08/25/17 0856 98.1 F (36.7 C)     Temp Source 08/25/17 0856 Oral     SpO2 08/25/17 0856 100 %     Weight 08/25/17 0857 138 lb (62.6 kg)     Height 08/25/17 0857 5\' 7"  (1.702 m)     Head Circumference --      Peak Flow --      Pain Score 08/25/17 0858 10     Pain Loc --      Pain Edu? --      Excl. in [place]? --      Constitutional: Alert and oriented. Well appearing and in no distress. HEENT   Head: Normocephalic and atraumatic.      Eyes: Conjunctivae are normal. Pupils equal and round.       Ears:         Nose: No congestion/rhinnorhea.   Mouth/Throat: Mucous membranes are moist.   Neck: No stridor. Cardiovascular/Chest: Normal rate, regular rhythm.  No murmurs, rubs, or gallops. Respiratory: Normal respiratory effort without tachypnea nor retractions. Breath sounds are clear and equal bilaterally. No wheezes/rales/rhonchi. Gastrointestinal: Soft. No distention, no guarding, no rebound. Nontender.    Genitourinary/rectal:Deferred Musculoskeletal: Right foot red and swollen and painful.  Right third and fifth toes are black without sensation.  Fourth toe amputation site looks like relatively clean, nondraining but nonhealing wound. Neurologic:  Normal speech and language. No  gross or focal neurologic deficits are appreciated. Skin:  Skin is warm, dry and intact. No rash noted. Psychiatric: Mood and affect are normal. Speech and behavior are normal. Patient exhibits appropriate insight and judgment.   ____________________________________________  LABS (pertinent positives/negatives) I, Lisa Roca, MD the attending physician have reviewed the labs noted below.  Labs Reviewed  BASIC METABOLIC PANEL - Abnormal; Notable for the following components:      Result Value   Glucose, Bld 128 (*)    BUN 59 (*)    Creatinine, Ser 2.12 (*)    GFR calc non Af Amer 30 (*)    GFR calc Af Amer 35 (*)    All other components within normal limits  CBC WITH DIFFERENTIAL/PLATELET - Abnormal; Notable for the following components:   WBC 17.3 (*)  RBC 2.93 (*)    Hemoglobin 8.2 (*)    HCT 24.8 (*)    Platelets 679 (*)    Neutro Abs 11.9 (*)    Monocytes Absolute 1.6 (*)    All other components within normal limits  CULTURE, BLOOD (ROUTINE X 2)  CULTURE, BLOOD (ROUTINE X 2)  TYPE AND SCREEN    ____________________________________________    EKG I, Lisa Roca, MD, the attending physician have personally viewed and interpreted all ECGs.  None ____________________________________________  RADIOLOGY All Xrays were viewed by me.  Imaging interpreted by Radiologist, and I, Lisa Roca, MD the attending physician have reviewed the radiologist interpretation noted below.  Right foot x-ray:  IMPRESSION: Status post amputation of the fourth digit at the level of the fourth MTP joint. No fracture or dislocation. Joint spaces appear normal. No erosive change or bony destruction. Occasional scattered foci of arterial vascular calcification. __________________________________________  PROCEDURES  Procedure(s) performed: None  Critical Care performed: CRITICAL CARE Performed by: Lisa Roca   Total critical care time: 30 minutes  Critical care time was  exclusive of separately billable procedures and treating other patients.  Critical care was necessary to treat or prevent imminent or life-threatening deterioration.  Critical care was time spent personally by me on the following activities: development of treatment plan with patient and/or surrogate as well as nursing, discussions with consultants, evaluation of patient's response to treatment, examination of patient, obtaining history from patient or surrogate, ordering and performing treatments and interventions, ordering and review of laboratory studies, ordering and review of radiographic studies, pulse oximetry and re-evaluation of patient's condition.    ____________________________________________  ED COURSE / ASSESSMENT AND PLAN  Pertinent labs & imaging results that were available during my care of the patient were reviewed by me and considered in my medical decision making (see chart for details).   Jordan Mills had efforts to attempt salvage of severe peripheral vascular disease with arterial insufficiency to an ischemic right leg about 2 weeks ago, this was following a fourth toe amputation on the same foot which was nonhealing.  He is now having gangrene due to ischemia to the third and fifth toes, and severe ischemic symptoms including swelling and redness and pain to the entire foot.  He has no dopplerable pulses DP and PT.  I spoke with vascular surgery, who indicates based on prior history, patient will likely not have any other vascular intervention to salvage the leg, but patient will likely need higher level leg amputation.  She will evaluate in the ED.  Patient Raquel Sarna has gangrene, I am going to cover with antibiotics given redness and swelling and pain all the way up his foot towards the ankle.  Blood culture sent, although patient does not appear to be septic at this point.  Dr. Lorenso Courier evaluated and will admit.  DIFFERENTIAL DIAGNOSIS: Including but not limited to chronic  and acute arterial insufficiency, cellulitis, gangrene, wound infection, sepsis, etc.  CONSULTATIONS: Dr. Lorenso Courier, vascular surgery, indicated that at the time of the vascular procedure on 11/4 there is felt to be no additional likely interventions, it was a heroic measure to try to salvage, but at this point suspects patient may need higher level amputation of that leg.  She will come to the ER for evaluation.   Patient / Family / Caregiver informed of clinical course, medical decision-making process, and agree with plan.    ___________________________________________  ED Discharge Orders    None      ___________________________________________  FINAL CLINICAL IMPRESSION(S) / ED DIAGNOSES   Final diagnoses:  Gangrene of toe of right foot ([place])  Arterial insufficiency with ischemic ulcer ([place])  Acute renal failure, unspecified acute renal failure type [facility])              Note: This dictation was prepared with Dragon dictation. Any transcriptional errors that result from this process are unintentional    Lisa Roca, MD 08/25/17 [place]    Lisa Roca, MD 08/25/17 1248

## 2017-08-25 NOTE — ED Triage Notes (Signed)
Pt has chronic right foot problem. Pt has had multiple surgeries to right foot, most recent last Thursday. Pt has poor circulation and has multiple stents. Pt has been on blood thinners. Pt has seen Dr. Caryl Comes recently, but pt states the pain is worse and the family states his foot is more black.    Family states they called on-call vascular and were told EDP to page them when patient is seen.

## 2017-08-26 LAB — CBC WITH DIFFERENTIAL/PLATELET
Basophils Absolute: 0.1 10*3/uL (ref 0–0.1)
Basophils Relative: 1 %
EOS PCT: 1 %
Eosinophils Absolute: 0.2 10*3/uL (ref 0–0.7)
HCT: 22.1 % — ABNORMAL LOW (ref 40.0–52.0)
Hemoglobin: 7.2 g/dL — ABNORMAL LOW (ref 13.0–18.0)
LYMPHS ABS: 5.2 10*3/uL — AB (ref 1.0–3.6)
LYMPHS PCT: 31 %
MCH: 27.7 pg (ref 26.0–34.0)
MCHC: 32.8 g/dL (ref 32.0–36.0)
MCV: 84.6 fL (ref 80.0–100.0)
MONO ABS: 1.5 10*3/uL — AB (ref 0.2–1.0)
MONOS PCT: 9 %
Neutro Abs: 9.6 10*3/uL — ABNORMAL HIGH (ref 1.4–6.5)
Neutrophils Relative %: 58 %
PLATELETS: 642 10*3/uL — AB (ref 150–440)
RBC: 2.61 MIL/uL — ABNORMAL LOW (ref 4.40–5.90)
RDW: 14.2 % (ref 11.5–14.5)
WBC: 16.6 10*3/uL — ABNORMAL HIGH (ref 3.8–10.6)

## 2017-08-26 LAB — COMPREHENSIVE METABOLIC PANEL
ALT: 33 U/L (ref 17–63)
AST: 42 U/L — ABNORMAL HIGH (ref 15–41)
Albumin: 2.5 g/dL — ABNORMAL LOW (ref 3.5–5.0)
Alkaline Phosphatase: 76 U/L (ref 38–126)
Anion gap: 9 (ref 5–15)
BUN: 42 mg/dL — ABNORMAL HIGH (ref 6–20)
CHLORIDE: 104 mmol/L (ref 101–111)
CO2: 24 mmol/L (ref 22–32)
Calcium: 9.1 mg/dL (ref 8.9–10.3)
Creatinine, Ser: 1.65 mg/dL — ABNORMAL HIGH (ref 0.61–1.24)
GFR calc Af Amer: 48 mL/min — ABNORMAL LOW (ref >60)
GFR, EST NON AFRICAN AMERICAN: 41 mL/min — AB (ref >60)
Glucose, Bld: 128 mg/dL — ABNORMAL HIGH (ref 65–99)
POTASSIUM: 4 mmol/L (ref 3.5–5.1)
Sodium: 137 mmol/L (ref 135–145)
TOTAL PROTEIN: 7.6 g/dL (ref 6.5–8.1)
Total Bilirubin: 0.3 mg/dL (ref 0.3–1.2)

## 2017-08-26 LAB — CBC
HEMATOCRIT: 22.6 % — AB (ref 40.0–52.0)
Hemoglobin: 7.4 g/dL — ABNORMAL LOW (ref 13.0–18.0)
MCH: 27.6 pg (ref 26.0–34.0)
MCHC: 32.6 g/dL (ref 32.0–36.0)
MCV: 84.7 fL (ref 80.0–100.0)
PLATELETS: 611 10*3/uL — AB (ref 150–440)
RBC: 2.67 MIL/uL — AB (ref 4.40–5.90)
RDW: 14.5 % (ref 11.5–14.5)
WBC: 15.6 10*3/uL — AB (ref 3.8–10.6)

## 2017-08-26 LAB — HEPARIN LEVEL (UNFRACTIONATED): Heparin Unfractionated: >3.6 IU/mL — ABNORMAL HIGH (ref 0.30–0.70)

## 2017-08-26 LAB — APTT
APTT: 93 s — AB (ref 24–36)
aPTT: 106 seconds — ABNORMAL HIGH (ref 24–36)

## 2017-08-26 MED ORDER — HEPARIN (PORCINE) IN NACL 100-0.45 UNIT/ML-% IJ SOLN
700.0000 [IU]/h | INTRAMUSCULAR | Status: DC
  Administered 2017-08-26: 800 [IU]/h via INTRAVENOUS
  Administered 2017-08-28: 700 [IU]/h via INTRAVENOUS
  Filled 2017-08-26 (×2): qty 250

## 2017-08-26 MED ORDER — HEPARIN (PORCINE) IN NACL 100-0.45 UNIT/ML-% IJ SOLN: 900.0000 [IU]/h | INTRAMUSCULAR | Status: DC

## 2017-08-26 MED ORDER — OXYCODONE-ACETAMINOPHEN 5-325 MG PO TABS
1.0000 | ORAL_TABLET | ORAL | Status: DC | PRN
  Administered 2017-08-26 – 2017-08-27 (×4): 2 via ORAL
  Administered 2017-08-28: 1 via ORAL
  Administered 2017-08-28 – 2017-08-31 (×6): 2 via ORAL
  Administered 2017-08-31 – 2017-09-01 (×2): 1 via ORAL
  Filled 2017-08-26 (×4): qty 2
  Filled 2017-08-26: qty 1
  Filled 2017-08-26 (×4): qty 2
  Filled 2017-08-26: qty 1
  Filled 2017-08-26: qty 2
  Filled 2017-08-26 (×3): qty 1

## 2017-08-26 NOTE — Progress Notes (Addendum)
ANTICOAGULATION CONSULT NOTE - FOLLOW UP Consult  Pharmacy Consult for Heparin  Indication: DVT  No Known Allergies  Patient Measurements: Height: 5\' 7"  (170.2 cm) Weight: 125 lb (56.7 kg) IBW/kg (Calculated) : 66.1 Heparin Dosing Weight: 62.6 kg   Vital Signs: Temp: 98.2 F (36.8 C) (11/12 1218) Temp Source: Oral (11/12 1218) BP: 116/53 (11/12 1218) Pulse Rate: 71 (11/12 1218)  Labs: Recent Labs    08/25/17 0942 08/25/17 1502 08/25/17 1532 08/26/17 0004 08/26/17 0243 08/26/17 1146  HGB 8.2*  --   --  7.2* 7.4*  --   HCT 24.8*  --   --  22.1* 22.6*  --   PLT 679*  --   --  642* 611*  --   APTT  --   --  62*  --  106* 93*  LABPROT  --  22.6*  --   --   --   --   INR  --  2.01  --   --   --   --   HEPARINUNFRC  --   --   --  >3.60*  --  >3.60*  CREATININE 2.12*  --   --  1.65*  --   --     Estimated Creatinine Clearance: 34.4 mL/min (A) (by C-G formula based on SCr of 1.65 mg/dL (H)).   Medical History: Past Medical History:  Diagnosis Date  . Gout   . HLD (hyperlipidemia)   . Hypertension   . Peripheral vascular disease (HCC)     Medications:  Scheduled:  . amLODipine  5 mg Oral BID  . aspirin EC  81 mg Oral Daily  . atorvastatin  10 mg Oral q1800  . docusate  50 mg Oral Daily  . lisinopril  20 mg Oral Daily   And  . hydrochlorothiazide  12.5 mg Oral Daily  . metoprolol tartrate  50 mg Oral BID  . pantoprazole  40 mg Oral Daily  . potassium chloride  20-40 mEq Oral Once    Assessment: Pharmacy consulted to dose heparin in this 66 year old male with DVT. Patient takes apixaban 5 mg bid last dose on 11/11 unknown time.  CrCl = 26.7 ml/min   Goal of Therapy:  Heparin level 0.3-0.7 units/ml Monitor platelets by anticoagulation protocol: Yes   Plan:  Will order Heparin 3800 units IV X 1 bolus followed by heparin gtt to start at @ 1100 units/hr. Will check HL 8 hrs after start of drip on 11/12 @ 00:00.   11/11@ 0000 HL >3.60 supratherapeutic  heparin was held for an hour, since patient is on apixaban ordered aPTT which was @ 0300 aPTT 106 supratherapeutic.  Heparin drip held again for another hour and restarted @ 0530 @ 800 units/hr. Rechecking HL/aPTT @ 1100. Hgb trended down by 1 unit, but now appears to be stable.  11/11 @ 11:46 APTT resulted @ 93. Will recheck APTT @ 1800. If level within goal range. Will then recheck Heparin/APTT with am labs.  Larene Beach, PharmD  Clinical Pharmacist 08/26/2017    11/12 1800 heparin level still >3.60. Will continue current regimen as aPTT was therapeutic at last check. Recheck aPTT, CBC, and heparin level with tomorrow AM labs.  11/13 AM aPTT 134. Reduce rate to 700 units/hr/ Recheck aPTT in 6 hours.  Sim Boast, PharmD, BCPS  08/26/17 10:05 PM

## 2017-08-26 NOTE — Progress Notes (Signed)
[facility]  Daily Progress Note   Subjective  - * No surgery date entered *  Patient still in pain.  Foot remains dark with poorly healing wounds.  No major events overnight  Objective Vitals:   08/26/17 0039 08/26/17 0404 08/26/17 1021 08/26/17 1218  BP: 131/64 (!) 121/58 120/61 (!) 116/53  Pulse: 83 85 94 71  Resp: 16 16 12 14   Temp: 97.8 F (36.6 C) (!) 97.3 F (36.3 C) 98 F (36.7 C) 98.2 F (36.8 C)  TempSrc: Oral Oral  Oral  SpO2: 96% 97% 100% 100%  Weight:      Height:        Intake/Output Summary (Last 24 hours) at 08/26/2017 1514 Last data filed at 08/26/2017 1353 Gross per 24 hour  Intake 1442.9 ml  Output 1350 ml  Net 92.9 ml    PULM  CTAB CV  RRR VASC  foot is dark and cool.  He is warm from just above the ankle superiorly.  No palpable pedal pulses  Laboratory CBC    Component Value Date/Time   WBC 15.6 (H) 08/26/2017 0243   HGB 7.4 (L) 08/26/2017 0243   HCT 22.6 (L) 08/26/2017 0243   PLT 611 (H) 08/26/2017 0243    BMET    Component Value Date/Time   NA 137 08/26/2017 0004   K 4.0 08/26/2017 0004   CL 104 08/26/2017 0004   CO2 24 08/26/2017 0004   GLUCOSE 128 (H) 08/26/2017 0004   BUN 42 (H) 08/26/2017 0004   CREATININE 1.65 (H) 08/26/2017 0004   CALCIUM 9.1 08/26/2017 0004   GFRNONAA 41 (L) 08/26/2017 0004   GFRAA 48 (L) 08/26/2017 0004    Assessment/Planning: PAD with gangrene right foot.   At this point, I think the only reasonable option would be to go ahead and proceed with an amputation.  He seems to be warm to the calf, so I think he would heal a below-knee amputee risks and benefits were discussed with the patient and he is agreeable to proceed.  This will be scheduled for Wednesday    [name]  08/26/2017, 3:14 PM

## 2017-08-26 NOTE — Progress Notes (Signed)
ANTICOAGULATION CONSULT NOTE - Initial Consult  Pharmacy Consult for Heparin  Indication: DVT  No Known Allergies  Patient Measurements: Height: 5\' 7"  (170.2 cm) Weight: 125 lb (56.7 kg) IBW/kg (Calculated) : 66.1 Heparin Dosing Weight: 62.6 kg   Vital Signs: Temp: 97.3 F (36.3 C) (11/12 0404) Temp Source: Oral (11/12 0404) BP: 121/58 (11/12 0404) Pulse Rate: 85 (11/12 0404)  Labs: Recent Labs    08/25/17 0942 08/25/17 1502 08/25/17 1532 08/26/17 0004 08/26/17 0243  HGB 8.2*  --   --  7.2* 7.4*  HCT 24.8*  --   --  22.1* 22.6*  PLT 679*  --   --  642* 611*  APTT  --   --  62*  --  106*  LABPROT  --  22.6*  --   --   --   INR  --  2.01  --   --   --   HEPARINUNFRC  --   --   --  >3.60*  --   CREATININE 2.12*  --   --  1.65*  --     Estimated Creatinine Clearance: 34.4 mL/min (A) (by C-G formula based on SCr of 1.65 mg/dL (H)).   Medical History: Past Medical History:  Diagnosis Date  . Gout   . HLD (hyperlipidemia)   . Hypertension   . Peripheral vascular disease (HCC)     Medications:  Scheduled:  . amLODipine  5 mg Oral BID  . aspirin EC  81 mg Oral Daily  . atorvastatin  10 mg Oral q1800  . docusate  50 mg Oral Daily  . lisinopril  20 mg Oral Daily   And  . hydrochlorothiazide  12.5 mg Oral Daily  . metoprolol tartrate  50 mg Oral BID  . pantoprazole  40 mg Oral Daily  . potassium chloride  20-40 mEq Oral Once    Assessment: Pharmacy consulted to dose heparin in this 66 year old male with DVT. Patient takes apixaban 5 mg bid last dose on 11/11 unknown time.  CrCl = 26.7 ml/min   Goal of Therapy:  Heparin level 0.3-0.7 units/ml Monitor platelets by anticoagulation protocol: Yes   Plan:  Will order Heparin 3800 units IV X 1 bolus followed by heparin gtt to start at @ 1100 units/hr. Will check HL 8 hrs after start of drip on 11/12 @ 00:00.   11/11@ 0000 HL >3.60 supratherapeutic heparin was held for an hour, since patient is on apixaban  ordered aPTT which was @ 0300 aPTT 106 supratherapeutic.  Heparin drip held again for another hour and restarted @ 0530 @ 800 units/hr. Rechecking HL/aPTT @ 1100. Hgb trended down by 1 unit, but now appears to be stable.  Tobie Lords, PharmD, BCPS Clinical Pharmacist 08/26/2017

## 2017-08-27 DIAGNOSIS — N189 Chronic kidney disease, unspecified: Secondary | ICD-10-CM

## 2017-08-27 DIAGNOSIS — I70261 Atherosclerosis of native arteries of extremities with gangrene, right leg: Secondary | ICD-10-CM

## 2017-08-27 LAB — APTT
APTT: 134 s — AB (ref 24–36)
APTT: 88 s — AB (ref 24–36)
aPTT: 88 seconds — ABNORMAL HIGH (ref 24–36)

## 2017-08-27 LAB — CBC
HCT: 22.5 % — ABNORMAL LOW (ref 40.0–52.0)
Hemoglobin: 7.4 g/dL — ABNORMAL LOW (ref 13.0–18.0)
MCH: 28.3 pg (ref 26.0–34.0)
MCHC: 32.9 g/dL (ref 32.0–36.0)
MCV: 86.1 fL (ref 80.0–100.0)
PLATELETS: 608 10*3/uL — AB (ref 150–440)
RBC: 2.62 MIL/uL — ABNORMAL LOW (ref 4.40–5.90)
RDW: 14.4 % (ref 11.5–14.5)
WBC: 15.3 10*3/uL — AB (ref 3.8–10.6)

## 2017-08-27 LAB — HEPARIN LEVEL (UNFRACTIONATED)

## 2017-08-27 MED ORDER — DEXTROSE 5 % IV SOLN
1.5000 g | INTRAVENOUS | Status: AC
  Administered 2017-08-28: 1.5 g via INTRAVENOUS
  Filled 2017-08-27: qty 1.5

## 2017-08-27 MED ORDER — VANCOMYCIN HCL IN DEXTROSE 750-5 MG/150ML-% IV SOLN
750.0000 mg | INTRAVENOUS | Status: DC
  Administered 2017-08-28 – 2017-08-31 (×4): 750 mg via INTRAVENOUS
  Filled 2017-08-27 (×5): qty 150

## 2017-08-27 MED ORDER — SORBITOL 70 % SOLN
30.0000 mL | Freq: Every day | Status: DC | PRN
  Administered 2017-08-27: 30 mL via ORAL
  Filled 2017-08-27 (×2): qty 30

## 2017-08-27 MED ORDER — BISACODYL 10 MG RE SUPP
10.0000 mg | Freq: Every day | RECTAL | Status: DC | PRN
  Administered 2017-08-27: 10 mg via RECTAL
  Filled 2017-08-27: qty 1

## 2017-08-27 MED ORDER — SODIUM CHLORIDE 0.9 % IV SOLN
INTRAVENOUS | Status: DC
  Administered 2017-08-27 – 2017-08-30 (×4): via INTRAVENOUS

## 2017-08-27 NOTE — Progress Notes (Signed)
[facility]  Daily Progress Note   Subjective  - * No surgery date entered *  No major events overnight.  Patient remains in pain with poorly healing wounds and gangrenous changes to the right foot.  No fever or chills  Objective Vitals:   08/26/17 1218 08/26/17 2051 08/27/17 0424 08/27/17 0840  BP: (!) 116/53 (!) 114/54 (!) 127/46 (!) 126/59  Pulse: 71 80 78 88  Resp: 14 16 17 16   Temp: 98.2 F (36.8 C) 98.1 F (36.7 C) 98.1 F (36.7 C) 99 F (37.2 C)  TempSrc: Oral  Oral Oral  SpO2: 100% 99% 99% 97%  Weight:      Height:        Intake/Output Summary (Last 24 hours) at 08/27/2017 1021 Last data filed at 08/27/2017 0957 Gross per 24 hour  Intake 1688.73 ml  Output 1175 ml  Net 513.73 ml    PULM  CTAB CV  RRR VASC  right foot cool and dark.  Is warm in the mid calf without ulcerations above the ankle.  Laboratory CBC    Component Value Date/Time   WBC 15.3 (H) 08/27/2017 0444   HGB 7.4 (L) 08/27/2017 0444   HCT 22.5 (L) 08/27/2017 0444   PLT 608 (H) 08/27/2017 0444    BMET    Component Value Date/Time   NA 137 08/26/2017 0004   K 4.0 08/26/2017 0004   CL 104 08/26/2017 0004   CO2 24 08/26/2017 0004   GLUCOSE 128 (H) 08/26/2017 0004   BUN 42 (H) 08/26/2017 0004   CREATININE 1.65 (H) 08/26/2017 0004   CALCIUM 9.1 08/26/2017 0004   GFRNONAA 41 (L) 08/26/2017 0004   GFRAA 48 (L) 08/26/2017 0004    Assessment/Planning: PAD with gangrene of the right foot   Understands this point a below-knee amputation will be necessary.  His foot has now turned dark and remains very painful.  Risks and benefits of right below-knee amputation were again discussed with the patient and he is agreeable to proceed.  Patient is anemic and we will have blood available for surgery.  Chronic kidney disease is present so we will continue some hydration    [name]  08/27/2017, 10:21 AM

## 2017-08-27 NOTE — Progress Notes (Signed)
ANTIBIOTIC CONSULT NOTE - INITIAL  Pharmacy Consult for Vancomycin Indication: right foot gangrene   No Known Allergies  Patient Measurements: Height: 5\' 7"  (170.2 cm) Weight: 125 lb (56.7 kg) IBW/kg (Calculated) : 66.1 Adjusted Body Weight:   Vital Signs: Temp: 98 F (36.7 C) (11/13 1335) Temp Source: Oral (11/13 1335) BP: 121/60 (11/13 1335) Pulse Rate: 84 (11/13 1335) Intake/Output from previous day: 11/12 0701 - 11/13 0700 In: 1924.6 [P.O.:360; I.V.:1364.6; IV Piggyback:200] Out: 1025 [Urine:1025] Intake/Output from this shift: Total I/O In: 694.8 [I.V.:694.8] Out: 825 [Urine:825]  Labs: Recent Labs    08/25/17 0942 08/26/17 0004 08/26/17 0243 08/27/17 0444  WBC 17.3* 16.6* 15.6* 15.3*  HGB 8.2* 7.2* 7.4* 7.4*  PLT 679* 642* 611* 608*  CREATININE 2.12* 1.65*  --   --    Estimated Creatinine Clearance: 34.4 mL/min (A) (by C-G formula based on SCr of 1.65 mg/dL (H)). No results for input(s): VANCOTROUGH, VANCOPEAK, VANCORANDOM, GENTTROUGH, GENTPEAK, GENTRANDOM, TOBRATROUGH, TOBRAPEAK, TOBRARND, AMIKACINPEAK, AMIKACINTROU, AMIKACIN in the last 72 hours.   Microbiology: Recent Results (from the past 720 hour(s))  MRSA PCR Screening     Status: None   Collection Time: 08/15/17  5:25 PM  Result Value Ref Range Status   MRSA by PCR NEGATIVE NEGATIVE Final    Comment:        The GeneXpert MRSA Assay (FDA approved for NASAL specimens only), is one component of a comprehensive MRSA colonization surveillance program. It is not intended to diagnose MRSA infection nor to guide or monitor treatment for MRSA infections.   Culture, blood (routine x 2)     Status: None (Preliminary result)   Collection Time: 08/25/17  9:42 AM  Result Value Ref Range Status   Specimen Description BLOOD BLOOD LEFT WRIST  Final   Special Requests   Final    BOTTLES DRAWN AEROBIC AND ANAEROBIC Blood Culture results may not be optimal due to an excessive volume of blood received in  culture bottles   Culture NO GROWTH 2 DAYS  Final   Report Status PENDING  Incomplete  Culture, blood (routine x 2)     Status: None (Preliminary result)   Collection Time: 08/25/17  9:42 AM  Result Value Ref Range Status   Specimen Description BLOOD BLOOD LEFT FOREARM  Final   Special Requests   Final    BOTTLES DRAWN AEROBIC AND ANAEROBIC Blood Culture results may not be optimal due to an excessive volume of blood received in culture bottles   Culture NO GROWTH 2 DAYS  Final   Report Status PENDING  Incomplete  Surgical PCR screen     Status: None   Collection Time: 08/25/17  4:50 PM  Result Value Ref Range Status   MRSA, PCR NEGATIVE NEGATIVE Final   Staphylococcus aureus NEGATIVE NEGATIVE Final    Comment: (NOTE) The Xpert SA Assay (FDA approved for NASAL specimens in patients 18 years of age and older), is one component of a comprehensive surveillance program. It is not intended to diagnose infection nor to guide or monitor treatment.     Medical History: Past Medical History:  Diagnosis Date  . Gout   . HLD (hyperlipidemia)   . Hypertension   . Peripheral vascular disease (HCC)     Medications:  Medications Prior to Admission  Medication Sig Dispense Refill Last Dose  . amLODipine (NORVASC) 5 MG tablet Take 5 mg by mouth 2 (two) times daily.  10 08/25/2017 at Unknown time  . apixaban (ELIQUIS) 5 MG TABS  tablet Take 1 tablet (5 mg total) 2 (two) times daily by mouth. 60 tablet 1 08/25/2017 at Unknown time  . aspirin EC 81 MG tablet Take by mouth.   08/25/2017 at Unknown time  . atorvastatin (LIPITOR) 10 MG tablet Take 1 tablet (10 mg total) by mouth daily. 30 tablet 11 08/25/2017 at Unknown time  . clopidogrel (PLAVIX) 75 MG tablet Take 1 tablet (75 mg total) by mouth daily. 30 tablet 11 08/25/2017 at Unknown time  . HYDROcodone-acetaminophen (NORCO/VICODIN) 5-325 MG tablet Take 1-2 tablets every 4 (four) hours as needed for up to 10 days by mouth for moderate pain. 30  tablet 0 08/25/2017 at Unknown time  . lisinopril-hydrochlorothiazide (PRINZIDE,ZESTORETIC) 20-12.5 MG tablet Take 2 tablets by mouth daily.  5 08/25/2017 at Unknown time  . metoprolol (LOPRESSOR) 50 MG tablet Take 50 mg by mouth 2 (two) times daily.  4 08/25/2017 at Unknown time   Scheduled:  . amLODipine  5 mg Oral BID  . aspirin EC  81 mg Oral Daily  . atorvastatin  10 mg Oral q1800  . docusate  50 mg Oral Daily  . lisinopril  20 mg Oral Daily   And  . hydrochlorothiazide  12.5 mg Oral Daily  . metoprolol tartrate  50 mg Oral BID  . pantoprazole  40 mg Oral Daily  . potassium chloride  20-40 mEq Oral Once   Assessment: Pharmacy to dose and monitor Vancomycin in this 66 year old male. Patient previously receiving Vancomycin 1 g IV q24 hours. Based on PK parameters, patient would need a reduced dose of Vancomycin to prevent supratherapeutic levels.   Goal of Therapy:  Vancomycin trough level 15-20 mcg/ml  Plan:  Will change dose from Vancomycin 1 g daily to  Vancomycin 750 mg IV q24 hours. Will need to order trough level prior to the 4th dose of this new regimen. Pharmacy to follow and order level when necessary.  Jordan Mills D 08/27/2017,2:16 PM

## 2017-08-27 NOTE — Progress Notes (Signed)
ANTICOAGULATION CONSULT NOTE - FOLLOW UP Consult  Pharmacy Consult for Heparin  Indication: DVT  No Known Allergies  Patient Measurements: Height: 5\' 7"  (170.2 cm) Weight: 125 lb (56.7 kg) IBW/kg (Calculated) : 66.1 Heparin Dosing Weight: 62.6 kg   Vital Signs: Temp: 99 F (37.2 C) (11/13 0840) Temp Source: Oral (11/13 0840) BP: 126/59 (11/13 0840) Pulse Rate: 88 (11/13 0840)  Labs: Recent Labs    08/25/17 0942 08/25/17 1502  08/26/17 0004 08/26/17 0243 08/26/17 1146 08/26/17 1749 08/27/17 0444 08/27/17 1146  HGB 8.2*  --   --  7.2* 7.4*  --   --  7.4*  --   HCT 24.8*  --   --  22.1* 22.6*  --   --  22.5*  --   PLT 679*  --   --  642* 611*  --   --  608*  --   APTT  --   --    < >  --  106* 93*  --  134* 88*  LABPROT  --  22.6*  --   --   --   --   --   --   --   INR  --  2.01  --   --   --   --   --   --   --   HEPARINUNFRC  --   --    < > >3.60*  --  >3.60* >3.60* >3.60*  --   CREATININE 2.12*  --   --  1.65*  --   --   --   --   --    < > = values in this interval not displayed.    Estimated Creatinine Clearance: 34.4 mL/min (A) (by C-G formula based on SCr of 1.65 mg/dL (H)).   Medical History: Past Medical History:  Diagnosis Date  . Gout   . HLD (hyperlipidemia)   . Hypertension   . Peripheral vascular disease (HCC)     Medications:  Scheduled:  . amLODipine  5 mg Oral BID  . aspirin EC  81 mg Oral Daily  . atorvastatin  10 mg Oral q1800  . docusate  50 mg Oral Daily  . lisinopril  20 mg Oral Daily   And  . hydrochlorothiazide  12.5 mg Oral Daily  . metoprolol tartrate  50 mg Oral BID  . pantoprazole  40 mg Oral Daily  . potassium chloride  20-40 mEq Oral Once    Assessment: Pharmacy consulted to dose heparin in this 66 year old male with DVT. Patient takes apixaban 5 mg bid last dose on 11/11 unknown time.  CrCl = 26.7 ml/min   Goal of Therapy:  Heparin level 0.3-0.7 units/ml Monitor platelets by anticoagulation protocol: Yes   Plan:   Will order Heparin 3800 units IV X 1 bolus followed by heparin gtt to start at @ 1100 units/hr. Will check HL 8 hrs after start of drip on 11/12 @ 00:00.   11/11@ 0000 HL >3.60 supratherapeutic heparin was held for an hour, since patient is on apixaban ordered aPTT which was @ 0300 aPTT 106 supratherapeutic.  Heparin drip held again for another hour and restarted @ 0530 @ 800 units/hr. Rechecking HL/aPTT @ 1100. Hgb trended down by 1 unit, but now appears to be stable.  11/11 @ 11:46 APTT resulted @ 93. Will recheck APTT @ 1800. If level within goal range. Will then recheck Heparin/APTT with am labs.  Larene Beach, PharmD  Clinical Pharmacist 08/27/2017  11/12 1800 heparin level still >3.60. Will continue current regimen as aPTT was therapeutic at last check. Recheck aPTT, CBC, and heparin level with tomorrow AM labs.  11/13 AM aPTT 134. Reduce rate to 700 units/hr/ Recheck aPTT in 6 hours.  11/13 APTT resulted @ 88 which is therapeutic. Will continue heparin gtt @ current rate. Will recheck APTT @1800 .   Larene Beach, PharmD  08/27/17 1:03 PM

## 2017-08-27 NOTE — Progress Notes (Signed)
ANTICOAGULATION CONSULT NOTE - FOLLOW UP Consult  Pharmacy Consult for Heparin  Indication: DVT  No Known Allergies  Patient Measurements: Height: 5\' 7"  (170.2 cm) Weight: 125 lb (56.7 kg) IBW/kg (Calculated) : 66.1 Heparin Dosing Weight: 62.6 kg   Vital Signs: Temp: 98 F (36.7 C) (11/13 1335) Temp Source: Oral (11/13 1335) BP: 121/60 (11/13 1335) Pulse Rate: 84 (11/13 1335)  Labs: Recent Labs    08/25/17 0942 08/25/17 1502  08/26/17 0004 08/26/17 0243 08/26/17 1146 08/26/17 1749 08/27/17 0444 08/27/17 1146 08/27/17 1808  HGB 8.2*  --   --  7.2* 7.4*  --   --  7.4*  --   --   HCT 24.8*  --   --  22.1* 22.6*  --   --  22.5*  --   --   PLT 679*  --   --  642* 611*  --   --  608*  --   --   APTT  --   --    < >  --  106* 93*  --  134* 88* 88*  LABPROT  --  22.6*  --   --   --   --   --   --   --   --   INR  --  2.01  --   --   --   --   --   --   --   --   HEPARINUNFRC  --   --    < > >3.60*  --  >3.60* >3.60* >3.60*  --   --   CREATININE 2.12*  --   --  1.65*  --   --   --   --   --   --    < > = values in this interval not displayed.    Estimated Creatinine Clearance: 34.4 mL/min (A) (by C-G formula based on SCr of 1.65 mg/dL (H)).   Medical History: Past Medical History:  Diagnosis Date  . Gout   . HLD (hyperlipidemia)   . Hypertension   . Peripheral vascular disease (HCC)     Medications:  Scheduled:  . amLODipine  5 mg Oral BID  . aspirin EC  81 mg Oral Daily  . atorvastatin  10 mg Oral q1800  . docusate  50 mg Oral Daily  . lisinopril  20 mg Oral Daily   And  . hydrochlorothiazide  12.5 mg Oral Daily  . metoprolol tartrate  50 mg Oral BID  . pantoprazole  40 mg Oral Daily  . potassium chloride  20-40 mEq Oral Once    Assessment: Pharmacy consulted to dose heparin in this 66 year old male with DVT. Patient takes apixaban 5 mg bid last dose on 11/11 unknown time.  CrCl = 26.7 ml/min   Goal of Therapy:  Heparin level 0.3-0.7 units/ml Monitor  platelets by anticoagulation protocol: Yes   Plan:  Will order Heparin 3800 units IV X 1 bolus followed by heparin gtt to start at @ 1100 units/hr. Will check HL 8 hrs after start of drip on 11/12 @ 00:00.   11/11@ 0000 HL >3.60 supratherapeutic heparin was held for an hour, since patient is on apixaban ordered aPTT which was @ 0300 aPTT 106 supratherapeutic.  Heparin drip held again for another hour and restarted @ 0530 @ 800 units/hr. Rechecking HL/aPTT @ 1100. Hgb trended down by 1 unit, but now appears to be stable.  11/11 @ 11:46 APTT resulted @ 93. Will recheck APTT @  1800. If level within goal range. Will then recheck Heparin/APTT with am labs.  Larene Beach, PharmD  Clinical Pharmacist 08/27/2017    11/12 1800 heparin level still >3.60. Will continue current regimen as aPTT was therapeutic at last check. Recheck aPTT, CBC, and heparin level with tomorrow AM labs.  11/13 AM aPTT 134. Reduce rate to 700 units/hr/ Recheck aPTT in 6 hours.  11/13 APTT resulted @ 88 which is therapeutic. Will continue heparin gtt @ current rate. Will recheck APTT @1800 .   11/13 APTT resulted @ 88 which is therapeutic. Will continue heparin gtt @ current rate. Will recheck APTT with am labs   Thomasenia Sales, PharmD, MBA, [facility]      08/27/17 6:54 PM

## 2017-08-28 ENCOUNTER — Inpatient Hospital Stay: Payer: Medicare Other | Admitting: Registered Nurse

## 2017-08-28 ENCOUNTER — Encounter: Payer: Self-pay | Admitting: Anesthesiology

## 2017-08-28 ENCOUNTER — Encounter
Admission: EM | Disposition: A | Discharge status: Home Health | Payer: Self-pay | Source: Home / Self Care | Attending: Vascular Surgery

## 2017-08-28 HISTORY — PX: AMPUTATION: SHX166

## 2017-08-28 LAB — APTT: APTT: 95 s — AB (ref 24–36)

## 2017-08-28 LAB — HEPARIN LEVEL (UNFRACTIONATED): Heparin Unfractionated: 2.8 IU/mL — ABNORMAL HIGH (ref 0.30–0.70)

## 2017-08-28 SURGERY — AMPUTATION BELOW KNEE
Anesthesia: General | Laterality: Right

## 2017-08-28 SURGERY — AMPUTATION BELOW KNEE
Anesthesia: General | Laterality: Right | Wound class: Dirty or Infected

## 2017-08-28 MED ORDER — FENTANYL CITRATE (PF) 100 MCG/2ML IJ SOLN
INTRAMUSCULAR | Status: DC | PRN
  Administered 2017-08-28 (×2): 50 ug via INTRAVENOUS

## 2017-08-28 MED ORDER — MIDAZOLAM HCL 2 MG/2ML IJ SOLN
INTRAMUSCULAR | Status: DC | PRN
  Administered 2017-08-28: 1 mg via INTRAVENOUS

## 2017-08-28 MED ORDER — FENTANYL CITRATE (PF) 100 MCG/2ML IJ SOLN
INTRAMUSCULAR | Status: AC
  Administered 2017-08-28: 25 ug via INTRAVENOUS
  Filled 2017-08-28: qty 2

## 2017-08-28 MED ORDER — LIDOCAINE HCL (PF) 2 % IJ SOLN
INTRAMUSCULAR | Status: AC
  Filled 2017-08-28: qty 10

## 2017-08-28 MED ORDER — FENTANYL CITRATE (PF) 100 MCG/2ML IJ SOLN
25.0000 ug | INTRAMUSCULAR | Status: DC | PRN
  Administered 2017-08-28 (×4): 25 ug via INTRAVENOUS

## 2017-08-28 MED ORDER — PROPOFOL 10 MG/ML IV BOLUS
INTRAVENOUS | Status: DC | PRN
  Administered 2017-08-28: 100 mg via INTRAVENOUS

## 2017-08-28 MED ORDER — SUGAMMADEX SODIUM 200 MG/2ML IV SOLN
INTRAVENOUS | Status: DC | PRN
  Administered 2017-08-28: 150 mg via INTRAVENOUS

## 2017-08-28 MED ORDER — MIDAZOLAM HCL 2 MG/2ML IJ SOLN
INTRAMUSCULAR | Status: AC
  Filled 2017-08-28: qty 2

## 2017-08-28 MED ORDER — PROPOFOL 10 MG/ML IV BOLUS
INTRAVENOUS | Status: AC
  Filled 2017-08-28: qty 20

## 2017-08-28 MED ORDER — DEXAMETHASONE SODIUM PHOSPHATE 10 MG/ML IJ SOLN
INTRAMUSCULAR | Status: DC | PRN
  Administered 2017-08-28: 5 mg via INTRAVENOUS

## 2017-08-28 MED ORDER — ONDANSETRON HCL 4 MG/2ML IJ SOLN
INTRAMUSCULAR | Status: DC | PRN
  Administered 2017-08-28: 4 mg via INTRAVENOUS

## 2017-08-28 MED ORDER — LIDOCAINE HCL (CARDIAC) 20 MG/ML IV SOLN
INTRAVENOUS | Status: DC | PRN
  Administered 2017-08-28: 60 mg via INTRAVENOUS

## 2017-08-28 MED ORDER — FENTANYL CITRATE (PF) 100 MCG/2ML IJ SOLN
INTRAMUSCULAR | Status: AC
  Filled 2017-08-28: qty 2

## 2017-08-28 MED ORDER — ROCURONIUM BROMIDE 100 MG/10ML IV SOLN
INTRAVENOUS | Status: DC | PRN
  Administered 2017-08-28: 40 mg via INTRAVENOUS

## 2017-08-28 MED ORDER — APIXABAN 5 MG PO TABS
5.0000 mg | ORAL_TABLET | Freq: Two times a day (BID) | ORAL | Status: DC
  Administered 2017-08-28 – 2017-09-02 (×10): 5 mg via ORAL
  Filled 2017-08-28 (×10): qty 1

## 2017-08-28 MED ORDER — ONDANSETRON HCL 4 MG/2ML IJ SOLN: 4.0000 mg | Freq: Once | INTRAMUSCULAR | Status: DC | PRN

## 2017-08-28 MED ORDER — ONDANSETRON HCL 4 MG/2ML IJ SOLN
INTRAMUSCULAR | Status: AC
  Filled 2017-08-28: qty 2

## 2017-08-28 MED ORDER — PHENYLEPHRINE HCL 10 MG/ML IJ SOLN
INTRAMUSCULAR | Status: DC | PRN
  Administered 2017-08-28 (×2): 100 ug via INTRAVENOUS

## 2017-08-28 MED ORDER — DEXAMETHASONE SODIUM PHOSPHATE 10 MG/ML IJ SOLN
INTRAMUSCULAR | Status: AC
  Filled 2017-08-28: qty 1

## 2017-08-28 MED ORDER — KETOROLAC TROMETHAMINE 30 MG/ML IJ SOLN
30.0000 mg | Freq: Four times a day (QID) | INTRAMUSCULAR | Status: AC
  Administered 2017-08-28 – 2017-08-29 (×4): 30 mg via INTRAVENOUS
  Filled 2017-08-28 (×4): qty 1

## 2017-08-28 SURGICAL SUPPLY — 36 items
BANDAGE ELASTIC 6 LF NS (GAUZE/BANDAGES/DRESSINGS) ×3 IMPLANT
BLADE SAGITTAL WIDE XTHICK NO (BLADE) ×3 IMPLANT
BNDG COHESIVE 4X5 TAN STRL (GAUZE/BANDAGES/DRESSINGS) ×3 IMPLANT
BNDG GAUZE 4.5X4.1 6PLY STRL (MISCELLANEOUS) ×6 IMPLANT
BRUSH SCRUB EZ  4% CHG (MISCELLANEOUS) ×2
BRUSH SCRUB EZ 4% CHG (MISCELLANEOUS) ×1 IMPLANT
CANISTER SUCT 1200ML W/VALVE (MISCELLANEOUS) ×3 IMPLANT
DRAIN PENROSE 1/4X12 LTX (DRAIN) ×3 IMPLANT
DURAPREP 26ML APPLICATOR (WOUND CARE) ×3 IMPLANT
ELECT CAUTERY BLADE 6.4 (BLADE) ×6 IMPLANT
ELECT REM PT RETURN 9FT ADLT (ELECTROSURGICAL) ×3
ELECTRODE REM PT RTRN 9FT ADLT (ELECTROSURGICAL) ×1 IMPLANT
GAUZE PETRO XEROFOAM 1X8 (MISCELLANEOUS) ×6 IMPLANT
GLOVE BIO SURGEON STRL SZ7 (GLOVE) ×6 IMPLANT
GLOVE INDICATOR 7.5 STRL GRN (GLOVE) ×3 IMPLANT
GOWN STRL REUS W/ TWL LRG LVL3 (GOWN DISPOSABLE) ×2 IMPLANT
GOWN STRL REUS W/ TWL XL LVL3 (GOWN DISPOSABLE) ×1 IMPLANT
GOWN STRL REUS W/TWL LRG LVL3 (GOWN DISPOSABLE) ×4
GOWN STRL REUS W/TWL XL LVL3 (GOWN DISPOSABLE) ×2
HANDLE YANKAUER SUCT BULB TIP (MISCELLANEOUS) ×3 IMPLANT
KIT RM TURNOVER STRD PROC AR (KITS) ×3 IMPLANT
LABEL OR SOLS (LABEL) ×3 IMPLANT
NS IRRIG 1000ML POUR BTL (IV SOLUTION) ×3 IMPLANT
PACK EXTREMITY ARMC (MISCELLANEOUS) ×3 IMPLANT
PAD ABD DERMACEA PRESS 5X9 (GAUZE/BANDAGES/DRESSINGS) ×6 IMPLANT
PAD PREP 24X41 OB/GYN DISP (PERSONAL CARE ITEMS) ×3 IMPLANT
SPONGE LAP 18X18 5 PK (GAUZE/BANDAGES/DRESSINGS) ×6 IMPLANT
STAPLER SKIN PROX 35W (STAPLE) ×3 IMPLANT
STOCKINETTE M/LG 89821 (MISCELLANEOUS) ×3 IMPLANT
SUT SILK 2 0 (SUTURE) ×2
SUT SILK 2 0 SH (SUTURE) ×6 IMPLANT
SUT SILK 2-0 18XBRD TIE 12 (SUTURE) ×1 IMPLANT
SUT SILK 3 0 (SUTURE) ×2
SUT SILK 3-0 18XBRD TIE 12 (SUTURE) ×1 IMPLANT
SUT VIC AB 0 CT1 36 (SUTURE) ×6 IMPLANT
SUT VIC AB 2-0 CT1 (SUTURE) ×6 IMPLANT

## 2017-08-28 NOTE — Op Note (Signed)
   OPERATIVE NOTE   PROCEDURE: Right below-the-knee amputation  PRE-OPERATIVE DIAGNOSIS: Right foot gangrene  POST-OPERATIVE DIAGNOSIS: same as above  SURGEON: [name], MD  ASSISTANT(S): none  ANESTHESIA: general  ESTIMATED BLOOD LOSS: 50 cc  FINDING(S): none  SPECIMEN(S):  Right below-the-knee amputation  INDICATIONS:   Jordan Mills is a 66 y.o. male who presents with right leg gangrene.  The patient is scheduled for a right below-the-knee amputation.  I discussed in depth with the patient the risks, benefits, and alternatives to this procedure.  The patient is aware that the risk of this operation included but are not limited to:  bleeding, infection, myocardial infarction, stroke, death, failure to heal amputation wound, and possible need for more proximal amputation.  The patient is aware of the risks and agrees proceed forward with the procedure.  DESCRIPTION:  After full informed written consent was obtained from the patient, the patient was brought back to the operating room, and placed supine upon the operating table.  Prior to induction, the patient received IV antibiotics.  The patient was then prepped and draped in the standard fashion for a below-the-knee amputation.  After obtaining adequate anesthesia, the patient was prepped and draped in the standard fashion for a right below-the-knee amputation.  I marked out the anterior incision two finger breadths below the tibial tuberosity and then the marked out a posterior flap that was one third of the circumference of the calf in length.   I made the incisions for these flaps, and then dissected through the subcutaneous tissue, fascia, and muscle anteriorly.  I elevated  the periosteal tissue superiorly so that the tibia was about 3-4 cm shorter than the anterior skin flap.  I then transected the tibia with a power saw and then took a wedge off the tibia anteriorly with the power saw.  Then I smoothed out the rough edges.   In a similar fashion, I cut back the fibula about two centimeters higher than the level of the tibia with a bone cutter.  I put a bone hook into the distal tibia and then used a large amputation knife to sharply develop a tissue plane through the muscle along the fibula.  In such fashion, the posterior flap was developed.  At this point, the specimen was passed off the field as the below-the-knee amputation.  At this point, I clamped all visibly bleeding arteries and veins using a combination of suture ligation with Silk suture and electrocautery.  Bleeding continued to be controlled with electrocautery and suture ligature.  The stump was washed off with sterile normal saline and no further active bleeding was noted.  I reapproximated the anterior and posterior fascia  with interrupted stitches of 0 Vicryl.  This was completed along the entire length of anterior and posterior fascia until there were no more loose space in the fascial line. I then placed a layer of 2-0 Vicryl sutures in the subcutaneous tissue. The skin was then  reapproximated with staples.  The stump was washed off and dried.  The incision was dressed with Xeroform and  then fluffs were applied.  Kerlix was wrapped around the leg and then gently an ACE wrap was applied.    COMPLICATIONS: none  CONDITION: stable   [name]  08/28/2017, 11:44 AM    This note was created with Dragon Medical transcription system. Any errors in dictation are purely unintentional.

## 2017-08-28 NOTE — H&P (Addendum)
[facility] History & Physical Update  The patient was interviewed and re-examined.  The patient's previous History and Physical has been reviewed and is unchanged. He has irreversible ischemia of the right foot, and needs a right BKA. There is no change in the plan of care. We plan to proceed with the scheduled procedure.  [name], MD  08/28/2017, 9:11 AM

## 2017-08-28 NOTE — Care Management (Addendum)
Patient admitted for gangrene atherosclerosis.  Patient lives at home with wife, and granddaughter.  Granddaughter provides transportation.  PCP [place].  Patient has crutches in the home.  POD 0 BKA.  PT consult pending.  RNCM following

## 2017-08-28 NOTE — Transfer of Care (Signed)
Immediate Anesthesia Transfer of Care Note  Patient: Jordan Mills  Procedure(s) Performed: AMPUTATION BELOW KNEE (Right )  Patient Location: PACU  Anesthesia Type:General  Level of Consciousness: awake, alert  and oriented  Airway & Oxygen Therapy: Spontaneous respirations   Post-op Assessment: Report given to RN and Post -op Vital signs reviewed and stable  Post vital signs: Reviewed and stable  Last Vitals:  Vitals:   08/28/17 1103 08/28/17 1106  BP: 138/64 138/64  Pulse: 87 87  Resp: (!) 23 19  Temp: 36.8 C 36.8 C  SpO2: 99% 99%    Last Pain:  Vitals:   08/28/17 0836  TempSrc: Temporal  PainSc: 10-Worst pain ever      Patients Stated Pain Goal: 0 (47/84/12 8208)  Complications: No apparent anesthesia complications

## 2017-08-28 NOTE — Anesthesia Post-op Follow-up Note (Signed)
Anesthesia QCDR form completed.        

## 2017-08-28 NOTE — Progress Notes (Signed)
PT Cancellation Note  Patient Details Name: THOAMS SIEFERT MRN: [id number] DOB: 18-Oct-1950   Cancelled Treatment:    Reason Eval/Treat Not Completed: (Consult received and chart reviewed.  Patient status post amputation this date.  Will hold evaluation at this time and re-attempt next date as patient available and medically appropriate.)   Valdez Brannan H. Owens Shark, PT, DPT, NCS 08/28/17, 2:40 PM [phone]

## 2017-08-28 NOTE — Progress Notes (Signed)
Dr Lucky Cowboy gave an order for toradol and wants the heparin discontinued.  Eliquis to be started this pm

## 2017-08-28 NOTE — Anesthesia Procedure Notes (Signed)
Procedure Name: Intubation Date/Time: 08/28/2017 9:53 AM Performed by: Hedda Slade, CRNA Pre-anesthesia Checklist: Patient identified, Patient being monitored, Timeout performed, Emergency Drugs available and Suction available Patient Re-evaluated:Patient Re-evaluated prior to induction Oxygen Delivery Method: Circle system utilized Preoxygenation: Pre-oxygenation with 100% oxygen Induction Type: IV induction Ventilation: Mask ventilation without difficulty Laryngoscope Size: McGraph and 4 Grade View: Grade II Tube type: Oral Tube size: 7.5 mm Number of attempts: 1 Airway Equipment and Method: Stylet Placement Confirmation: ETT inserted through vocal cords under direct vision,  positive ETCO2 and breath sounds checked- equal and bilateral Secured at: 23 cm Tube secured with: Tape Dental Injury: Teeth and Oropharynx as per pre-operative assessment  Difficulty Due To: Difficult Airway- due to dentition and Difficult Airway- due to anterior larynx Comments: Mcgrath used d/t poor dentition. When asked which teeth were loose, patient responds "all of them".  Aware of the possibility of loosing one or more teeth during airway manipulation.

## 2017-08-28 NOTE — Anesthesia Preprocedure Evaluation (Addendum)
Anesthesia Evaluation  Patient identified by MRN, date of birth, ID band Patient awake    Reviewed: Allergy & Precautions, H&P , NPO status , Patient's Chart, lab work & pertinent test results, reviewed documented beta blocker date and time   Airway Mallampati: II   Neck ROM: full    Dental  (+) Poor Dentition, Missing, Loose   Pulmonary neg pulmonary ROS, former smoker,    Pulmonary exam normal        Cardiovascular Exercise Tolerance: Poor hypertension, Pt. on medications and Pt. on home beta blockers + Peripheral Vascular Disease  negative cardio ROS Normal cardiovascular exam Rhythm:regular Rate:Normal     Neuro/Psych negative neurological ROS  negative psych ROS   GI/Hepatic negative GI ROS, Neg liver ROS,   Endo/Other  negative endocrine ROS  Renal/GU negative Renal ROS  negative genitourinary   Musculoskeletal   Abdominal   Peds  Hematology  (+) anemia ,   Anesthesia Other Findings Past Medical History: No date: Gout No date: HLD (hyperlipidemia) No date: Hypertension No date: Peripheral vascular disease (No Name) Past Surgical History: 07/19/2017: AMPUTATION TOE; Right     Comment:  Procedure: AMPUTATION TOE/Rt 4th MPJ;  Surgeon: Sharlotte Alamo, DPM;  Location: [facility] ORS;  Service: Podiatry;                Laterality: Right; 07/01/2017: LOWER EXTREMITY ANGIOGRAPHY; Right     Comment:  Procedure: Lower Extremity Angiography;  Surgeon: Algernon Huxley, MD;  Location: [place] [facility];  Service:               Cardiovascular;  Laterality: Right; No date: TONSILLECTOMY BMI    Body Mass Index:  19.25 kg/m     Reproductive/Obstetrics negative OB ROS                             Anesthesia Physical  Anesthesia Plan  ASA: III  Anesthesia Plan: General   Post-op Pain Management:    Induction:   PONV Risk Score and Plan: 3 and Ondansetron,  Dexamethasone, Midazolam and Propofol infusion  Airway Management Planned: Oral ETT  Additional Equipment:   Intra-op Plan:   Post-operative Plan: Extubation in OR  Informed Consent: I have reviewed the patients History and Physical, chart, labs and discussed the procedure including the risks, benefits and alternatives for the proposed anesthesia with the patient or authorized representative who has indicated his/her understanding and acceptance.   Dental Advisory Given  Plan Discussed with: CRNA and Surgeon  Anesthesia Plan Comments:         Anesthesia Quick Evaluation

## 2017-08-28 NOTE — Anesthesia Postprocedure Evaluation (Signed)
Anesthesia Post Note  Patient: Jordan Mills  Procedure(s) Performed: AMPUTATION BELOW KNEE (Right )  Patient location during evaluation: PACU Anesthesia Type: General Level of consciousness: awake and alert and oriented Pain management: pain level controlled Vital Signs Assessment: post-procedure vital signs reviewed and stable Respiratory status: spontaneous breathing Cardiovascular status: blood pressure returned to baseline Anesthetic complications: no     Last Vitals:  Vitals:   08/28/17 1248 08/28/17 1339  BP: 134/63 124/61  Pulse: 82 79  Resp: 16   Temp: 36.9 C 36.9 C  SpO2: 99% 99%    Last Pain:  Vitals:   08/28/17 1343  TempSrc:   PainSc: Asleep                 Kirstina Leinweber

## 2017-08-28 NOTE — Progress Notes (Signed)
Patient transported to the OR via bed by OR orderly and family

## 2017-08-29 ENCOUNTER — Encounter: Payer: Self-pay | Admitting: Vascular Surgery

## 2017-08-29 LAB — CREATININE, SERUM
CREATININE: 1.53 mg/dL — AB (ref 0.61–1.24)
GFR, EST AFRICAN AMERICAN: 52 mL/min — AB (ref >60)
GFR, EST NON AFRICAN AMERICAN: 45 mL/min — AB (ref >60)

## 2017-08-29 LAB — CBC
HCT: 21.9 % — ABNORMAL LOW (ref 40.0–52.0)
HEMOGLOBIN: 6.9 g/dL — AB (ref 13.0–18.0)
MCH: 27 pg (ref 26.0–34.0)
MCHC: 31.7 g/dL — AB (ref 32.0–36.0)
MCV: 85.5 fL (ref 80.0–100.0)
PLATELETS: 611 10*3/uL — AB (ref 150–440)
RBC: 2.57 MIL/uL — ABNORMAL LOW (ref 4.40–5.90)
RDW: 14.3 % (ref 11.5–14.5)
WBC: 16.9 10*3/uL — ABNORMAL HIGH (ref 3.8–10.6)

## 2017-08-29 MED ORDER — FERROUS SULFATE 325 (65 FE) MG PO TABS
325.0000 mg | ORAL_TABLET | Freq: Two times a day (BID) | ORAL | Status: DC
  Administered 2017-08-29 – 2017-09-02 (×8): 325 mg via ORAL
  Filled 2017-08-29 (×8): qty 1

## 2017-08-29 NOTE — Progress Notes (Signed)
Called Dr. Lucky Cowboy about patients blood pressure being 108/63 prior to nursing student administering medications for blood pressure. Per Dr. Lucky Cowboy verbal it was ok to administer blood pressure medications.

## 2017-08-29 NOTE — Evaluation (Signed)
Physical Therapy Evaluation Patient Details Name: Jordan Mills MRN: [id number] DOB: 1950/12/31 Today's Date: 08/29/2017   History of Present Illness  presented to ER secondary to severe pain in R foot (recent thrombectomy, angioplasty and stenting to R LE 11/2; recent R 4th toe amp); now status post R BKA (11/13)  Clinical Impression  Upon evaluation, patient alert and oriented; follows all commands and demonstrates good effort with all tasks. Pain well controlled and patient with good tolerance for movement/functional activities.  Bilat UE/LE strength and ROM grossly symmetrical and WFL; mild decrease in R knee flexion due to post-op soreness.  Demonstrates ability to complete bed mobility with mod indep; sit/stand, basic transfers and very short-distance gait (3-4 steps) with RW, cga/min assist.  Good UB strength/control; fair standing balance.  Additional gait/exertional activity deferred secondary to decreased HgB; will continue to progress as medically appropriate. Would benefit from skilled PT to address above deficits and promote optimal return to PLOF ; Recommend transition to [facility] upon discharge from acute hospitalization.     Follow Up Recommendations Home health PT(HHOT)    Equipment Recommendations  Rolling walker with 5" wheels    Recommendations for Other Services       Precautions / Restrictions Precautions Precautions: Fall Restrictions Weight Bearing Restrictions: No      Mobility  Bed Mobility Overal bed mobility: Modified Independent                Transfers Overall transfer level: Needs assistance Equipment used: Rolling walker (2 wheeled) Transfers: Sit to/from Stand Sit to Stand: Min guard;Min assist         General transfer comment: cuing for hand placement, static standing balance  Ambulation/Gait Ambulation/Gait assistance: Min guard;Min assist Ambulation Distance (Feet): 5 Feet Assistive device: Rolling walker (2 wheeled)        General Gait Details: 3-4 hops forward/backward with RW, cga/min assist.  Good UB strength, good L LE control without excessive force of contact to floor.  Fair standing balance; min cuing for walker position.  Additional gait/exertional activity deferred due to current HgB (6.9); asymptomatic  Stairs            Wheelchair Mobility    Modified Rankin (Stroke Patients Only)       Balance Overall balance assessment: Needs assistance Sitting-balance support: No upper extremity supported;Feet supported Sitting balance-Leahy Scale: Good     Standing balance support: Bilateral upper extremity supported Standing balance-Leahy Scale: Fair                               Pertinent Vitals/Pain Pain Assessment: No/denies pain    Home Living Family/patient expects to be discharged to:: Private residence Living Arrangements: Spouse/significant other Available Help at Discharge: Family(family planning to provide 24/7 care upon discharge as needed) Type of Home: House Home Access: Stairs to enter Entrance Stairs-Rails: Right Entrance Stairs-Number of Steps: 1 Home Layout: One level        Prior Function Level of Independence: Independent with assistive device(s)         Comments: Mod indep for ADLs, household and community distance with RW; denies fall history     Hand Dominance        Extremity/Trunk Assessment   Upper Extremity Assessment Upper Extremity Assessment: Generalized weakness    Lower Extremity Assessment Lower Extremity Assessment: (R knee grossly 3-/5 (limited by post-op soreness); otherwise, grossly 4+ to 5/5 throughout)  Communication   Communication: No difficulties  Cognition Arousal/Alertness: Awake/alert Behavior During Therapy: WFL for tasks assessed/performed Overall Cognitive Status: Within Functional Limits for tasks assessed                                        General Comments      Exercises  Other Exercises Other Exercises: Educated in positioning (to promote R hip/knee extension); reviewed techniques to minimize phantom pain.  Patient voiced understanding of all education. Other Exercises: Re-wrapped R residual limb with ace wrap for full coverage to site.  Will continue education regarding role of wrapping, shaping as appropriate.   Assessment/Plan    PT Assessment Patient needs continued PT services  PT Problem List Decreased strength;Decreased range of motion;Decreased activity tolerance;Decreased mobility;Decreased cognition;Decreased knowledge of use of DME;Decreased safety awareness;Decreased knowledge of precautions;Decreased skin integrity       PT Treatment Interventions DME instruction;Gait training;Stair training;Functional mobility training;Therapeutic activities;Therapeutic exercise;Balance training;Patient/family education    PT Goals (Current goals can be found in the Care Plan section)  Acute Rehab PT Goals Patient Stated Goal: to return home PT Goal Formulation: With patient/family Time For Goal Achievement: 09/12/17 Potential to Achieve Goals: Good    Frequency 7X/week   Barriers to discharge        Co-evaluation               AM-PAC PT "6 Clicks" Daily Activity  Outcome Measure Difficulty turning over in bed (including adjusting bedclothes, sheets and blankets)?: A Little Difficulty moving from lying on back to sitting on the side of the bed? : A Little Difficulty sitting down on and standing up from a chair with arms (e.g., wheelchair, bedside commode, etc,.)?: Unable Help needed moving to and from a bed to chair (including a wheelchair)?: A Little Help needed walking in hospital room?: A Little Help needed climbing 3-5 steps with a railing? : A Lot 6 Click Score: 15    End of Session Equipment Utilized During Treatment: Gait belt Activity Tolerance: Patient tolerated treatment well Patient left: in bed;with call bell/phone within  reach;with bed alarm set;with family/visitor present Nurse Communication: Mobility status PT Visit Diagnosis: Muscle weakness (generalized) (M62.81);Difficulty in walking, not elsewhere classified (R26.2)    Time: 7741-2878 PT Time Calculation (min) (ACUTE ONLY): 26 min   Charges:   PT Evaluation $PT Eval Moderate Complexity: 1 Mod PT Treatments $Therapeutic Activity: 8-22 mins   PT G Codes:   PT G-Codes **NOT FOR INPATIENT CLASS** Functional Assessment Tool Used: AM-PAC 6 Clicks Basic Mobility Functional Limitation: Mobility: Walking and moving around Mobility: Walking and Moving Around Current Status (M7672): At least 40 percent but less than 60 percent impaired, limited or restricted Mobility: Walking and Moving Around Goal Status [phone]): At least 1 percent but less than 20 percent impaired, limited or restricted    Eann Cleland H. Owens Shark, PT, DPT, NCS 08/29/17, 9:51 PM [phone]

## 2017-08-29 NOTE — Progress Notes (Signed)
[facility]  Daily Progress Note   Subjective  - 1 Day Post-Op  Doing fairly well.  Pain control OK.  No signs of bleeding  Objective Vitals:   08/29/17 0532 08/29/17 0816 08/29/17 1041 08/29/17 1326  BP: (!) 109/53 (!) 126/59 108/63 110/60  Pulse: 78 78 79 75  Resp: 16     Temp: 98.1 F (36.7 C)   (!) 97.4 F (36.3 C)  TempSrc: Oral   Oral  SpO2: 99% 100%  99%  Weight:      Height:        Intake/Output Summary (Last 24 hours) at 08/29/2017 1600 Last data filed at 08/29/2017 1054 Gross per 24 hour  Intake 1090.83 ml  Output 800 ml  Net 290.83 ml    PULM  CTAB CV  RRR VASC  Dressing C/D/I  Laboratory CBC    Component Value Date/Time   WBC 16.9 (H) 08/29/2017 0439   HGB 6.9 (L) 08/29/2017 0439   HCT 21.9 (L) 08/29/2017 0439   PLT 611 (H) 08/29/2017 0439    BMET    Component Value Date/Time   NA 137 08/26/2017 0004   K 4.0 08/26/2017 0004   CL 104 08/26/2017 0004   CO2 24 08/26/2017 0004   GLUCOSE 128 (H) 08/26/2017 0004   BUN 42 (H) 08/26/2017 0004   CREATININE 1.53 (H) 08/29/2017 0439   CALCIUM 9.1 08/26/2017 0004   GFRNONAA 45 (L) 08/29/2017 0439   GFRAA 52 (L) 08/29/2017 0439    Assessment/Planning: POD #1 s/p right BKA   Doing OK  PT  Hgb 6.9.  Will recheck tomorrow and if any lower may need a transfusion  Fe tablets       [name]  08/29/2017, 4:00 PM

## 2017-08-30 LAB — CBC
HCT: 20.3 % — ABNORMAL LOW (ref 40.0–52.0)
Hemoglobin: 6.4 g/dL — ABNORMAL LOW (ref 13.0–18.0)
MCH: 27.2 pg (ref 26.0–34.0)
MCHC: 31.6 g/dL — AB (ref 32.0–36.0)
MCV: 85.9 fL (ref 80.0–100.0)
PLATELETS: 566 10*3/uL — AB (ref 150–440)
RBC: 2.36 MIL/uL — ABNORMAL LOW (ref 4.40–5.90)
RDW: 14.4 % (ref 11.5–14.5)
WBC: 15.8 10*3/uL — AB (ref 3.8–10.6)

## 2017-08-30 LAB — HEMOGLOBIN AND HEMATOCRIT, BLOOD
HCT: 27.6 % — ABNORMAL LOW (ref 40.0–52.0)
Hemoglobin: 9 g/dL — ABNORMAL LOW (ref 13.0–18.0)

## 2017-08-30 LAB — CREATININE, SERUM
CREATININE: 1.42 mg/dL — AB (ref 0.61–1.24)
GFR calc Af Amer: 57 mL/min — ABNORMAL LOW (ref >60)
GFR calc non Af Amer: 49 mL/min — ABNORMAL LOW (ref >60)

## 2017-08-30 LAB — SURGICAL PATHOLOGY

## 2017-08-30 LAB — CULTURE, BLOOD (ROUTINE X 2)
CULTURE: NO GROWTH
Culture: NO GROWTH

## 2017-08-30 LAB — PREPARE RBC (CROSSMATCH)

## 2017-08-30 LAB — VANCOMYCIN, TROUGH: Vancomycin Tr: 16 ug/mL (ref 15–20)

## 2017-08-30 MED ORDER — ENSURE ENLIVE PO LIQD
237.0000 mL | Freq: Two times a day (BID) | ORAL | Status: DC
  Administered 2017-08-30 – 2017-09-02 (×5): 237 mL via ORAL

## 2017-08-30 MED ORDER — ADULT MULTIVITAMIN W/MINERALS CH
1.0000 | ORAL_TABLET | Freq: Every day | ORAL | Status: DC
  Administered 2017-08-30 – 2017-09-02 (×4): 1 via ORAL
  Filled 2017-08-30 (×4): qty 1

## 2017-08-30 MED ORDER — FUROSEMIDE 10 MG/ML IJ SOLN
10.0000 mg | Freq: Once | INTRAMUSCULAR | Status: AC
  Administered 2017-08-30: 10 mg via INTRAVENOUS
  Filled 2017-08-30: qty 4

## 2017-08-30 MED ORDER — SODIUM CHLORIDE 0.9 % IV SOLN: Freq: Once | INTRAVENOUS | Status: DC

## 2017-08-30 NOTE — Progress Notes (Signed)
PT Cancellation Note  Patient Details Name: Jordan Mills MRN: [id number] DOB: [birth year]   Cancelled Treatment:    Reason Eval/Treat Not Completed: Medical issues which prohibited therapy(Chart reviewed for attempted treatment session.  Patient noted with drop in HgB from previous date (currently 6.4); pending blood transfusion.  Per policy, will hold exertional activity and re-attempt post-transfusion as medically appropriate.)   Desirre Eickhoff H. Owens Shark, PT, DPT, NCS 08/30/17, 10:35 AM [phone]

## 2017-08-30 NOTE — Progress Notes (Signed)
ANTIBIOTIC CONSULT NOTE -FOLLOW UP   Pharmacy Consult for Vancomycin Indication: right foot gangrene   No Known Allergies  Patient Measurements: Height: 5\' 7"  (170.2 cm) Weight: 125 lb (56.7 kg) IBW/kg (Calculated) : 66.1 Adjusted Body Weight:   Vital Signs: Temp: 98 F (36.7 C) (11/16 0457) Temp Source: Oral (11/16 0457) BP: 115/53 (11/16 0457) Pulse Rate: 69 (11/16 0457) Intake/Output from previous day: 11/15 0701 - 11/16 0700 In: 1537.5 [P.O.:240; I.V.:1147.5; IV Piggyback:150] Out: 1550 [Urine:1550] Intake/Output from this shift: No intake/output data recorded.  Labs: Recent Labs    08/29/17 0439 08/30/17 0350  WBC 16.9* 15.8*  HGB 6.9* 6.4*  PLT 611* 566*  CREATININE 1.53* 1.42*   Estimated Creatinine Clearance: 39.9 mL/min (A) (by C-G formula based on SCr of 1.42 mg/dL (H)). Recent Labs    08/30/17 0725  [place] [address]     Microbiology: Recent Results (from the past 720 hour(s))  MRSA PCR Screening     Status: None   Collection Time: 08/15/17  5:25 PM  Result Value Ref Range Status   MRSA by PCR NEGATIVE NEGATIVE Final    Comment:        The GeneXpert MRSA Assay (FDA approved for NASAL specimens only), is one component of a comprehensive MRSA colonization surveillance program. It is not intended to diagnose MRSA infection nor to guide or monitor treatment for MRSA infections.   Culture, blood (routine x 2)     Status: None   Collection Time: 08/25/17  9:42 AM  Result Value Ref Range Status   Specimen Description BLOOD BLOOD LEFT WRIST  Final   Special Requests   Final    BOTTLES DRAWN AEROBIC AND ANAEROBIC Blood Culture results may not be optimal due to an excessive volume of blood received in culture bottles   Culture NO GROWTH 5 DAYS  Final   Report Status 08/30/2017 FINAL  Final  Culture, blood (routine x 2)     Status: None   Collection Time: 08/25/17  9:42 AM  Result Value Ref Range Status   Specimen Description BLOOD BLOOD LEFT  FOREARM  Final   Special Requests   Final    BOTTLES DRAWN AEROBIC AND ANAEROBIC Blood Culture results may not be optimal due to an excessive volume of blood received in culture bottles   Culture NO GROWTH 5 DAYS  Final   Report Status 08/30/2017 FINAL  Final  Surgical PCR screen     Status: None   Collection Time: 08/25/17  4:50 PM  Result Value Ref Range Status   MRSA, PCR NEGATIVE NEGATIVE Final   Staphylococcus aureus NEGATIVE NEGATIVE Final    Comment: (NOTE) The Xpert SA Assay (FDA approved for NASAL specimens in patients 53 years of age and older), is one component of a comprehensive surveillance program. It is not intended to diagnose infection nor to guide or monitor treatment.     Medical History: Past Medical History:  Diagnosis Date  . Gout   . HLD (hyperlipidemia)   . Hypertension   . Peripheral vascular disease (HCC)     Medications:  Medications Prior to Admission  Medication Sig Dispense Refill Last Dose  . amLODipine (NORVASC) 5 MG tablet Take 5 mg by mouth 2 (two) times daily.  10 08/25/2017 at Unknown time  . apixaban (ELIQUIS) 5 MG TABS tablet Take 1 tablet (5 mg total) 2 (two) times daily by mouth. 60 tablet 1 08/25/2017 at Unknown time  . aspirin EC 81 MG tablet Take by mouth.  08/25/2017 at Unknown time  . atorvastatin (LIPITOR) 10 MG tablet Take 1 tablet (10 mg total) by mouth daily. 30 tablet 11 08/25/2017 at Unknown time  . clopidogrel (PLAVIX) 75 MG tablet Take 1 tablet (75 mg total) by mouth daily. 30 tablet 11 08/25/2017 at Unknown time  . [EXPIRED] HYDROcodone-acetaminophen (NORCO/VICODIN) 5-325 MG tablet Take 1-2 tablets every 4 (four) hours as needed for up to 10 days by mouth for moderate pain. 30 tablet 0 08/25/2017 at Unknown time  . lisinopril-hydrochlorothiazide (PRINZIDE,ZESTORETIC) 20-12.5 MG tablet Take 2 tablets by mouth daily.  5 08/25/2017 at Unknown time  . metoprolol (LOPRESSOR) 50 MG tablet Take 50 mg by mouth 2 (two) times daily.  4  08/25/2017 at Unknown time   Scheduled:  . amLODipine  5 mg Oral BID  . apixaban  5 mg Oral BID  . aspirin EC  81 mg Oral Daily  . atorvastatin  10 mg Oral q1800  . docusate  50 mg Oral Daily  . ferrous sulfate  325 mg Oral BID WC  . furosemide  10 mg Intravenous Once  . lisinopril  20 mg Oral Daily   And  . hydrochlorothiazide  12.5 mg Oral Daily  . metoprolol tartrate  50 mg Oral BID   Assessment: Pharmacy to dose and monitor Vancomycin in this 66 year old male. Patient previously receiving Vancomycin 1 g IV q24 hours. Based on PK parameters, patient would need a reduced dose of Vancomycin to prevent supratherapeutic levels.   Goal of Therapy:  Vancomycin trough level 15-20 mcg/ml  Plan:  Vancomycin Trough level resulted @ 16. Will continue Vancomycin 750 mg IV q24 hours.  Seniyah Esker D 08/30/2017,8:51 AM

## 2017-08-30 NOTE — Progress Notes (Signed)
[facility]  Daily Progress Note   Subjective  - 2 Days Post-Op  Patient feels reasonably well.  Hemoglobin continues to drift down and he is scheduled to get blood transfusion today.  Objective Vitals:   08/29/17 2031 08/30/17 0457 08/30/17 0914 08/30/17 1119  BP: (!) 120/54 (!) 115/53 (!) 149/82 (!) 98/47  Pulse: 77 69 69 64  Resp: 20 20  20   Temp: 98 F (36.7 C) 98 F (36.7 C) 98.1 F (36.7 C) (!) 97.5 F (36.4 C)  TempSrc: Oral Oral Oral Oral  SpO2: 100% 99% 95% 100%  Weight:      Height:        Intake/Output Summary (Last 24 hours) at 08/30/2017 1145 Last data filed at 08/30/2017 0735 Gross per 24 hour  Intake 1460 ml  Output 1350 ml  Net 110 ml    PULM  CTAB CV  RRR VASC  dressings clean, dry, and intact  Laboratory CBC    Component Value Date/Time   WBC 15.8 (H) 08/30/2017 0350   HGB 6.4 (L) 08/30/2017 0350   HCT 20.3 (L) 08/30/2017 0350   PLT 566 (H) 08/30/2017 0350    BMET    Component Value Date/Time   NA 137 08/26/2017 0004   K 4.0 08/26/2017 0004   CL 104 08/26/2017 0004   CO2 24 08/26/2017 0004   GLUCOSE 128 (H) 08/26/2017 0004   BUN 42 (H) 08/26/2017 0004   CREATININE 1.42 (H) 08/30/2017 0350   CALCIUM 9.1 08/26/2017 0004   GFRNONAA 49 (L) 08/30/2017 0350   GFRAA 57 (L) 08/30/2017 0350    Assessment/Planning: POD #2 s/p right BKA   Transfusion for anemia.  Physical therapy said he could probably go home with home health and home physical therapy.  Would not do that immediately given his low hemoglobin, but potentially by Sunday he would be ready for discharge.    Jordan Mills  08/30/2017, 11:45 AM

## 2017-08-30 NOTE — Progress Notes (Signed)
Primary nurse noted that pt hgb was 6.4 on AM labs. Primary nurse paged and spoke to Dr. Delana Meyer in regard to pt HGB. Orders were receivedto Type and Cross 2 units. Transfuse 2 units. Give 10mg  IV lasix after first unit is complete. Primary nurse to continue to monitor.

## 2017-08-30 NOTE — Care Management (Signed)
PT has assessed patient and recommend home health.  Patient agreeable to home health services.  Patient provided home health agency.  Patient states that he does not have a preference of agency.  Heads up referral made to [place] with [facility] for services.  Patient will require RW and BSC at discharge. RNCM following.

## 2017-08-30 NOTE — Care Management Important Message (Signed)
Important Message  Patient Details  Name: Jordan Mills MRN: [id number] Date of Birth: 15-Apr-1951   Medicare Important Message Given:  Yes    Beverly Sessions, RN 08/30/2017, 3:53 PM

## 2017-08-30 NOTE — Progress Notes (Signed)
Initial Nutrition Assessment  DOCUMENTATION CODES:   Non-severe (moderate) malnutrition in context of acute illness/injury  INTERVENTION:   Ensure Enlive po BID, each supplement provides 350 kcal and 20 grams of protein  MVI  Liberalize diet   NUTRITION DIAGNOSIS:   Moderate Malnutrition related to acute illness(wound infection) as evidenced by 5 percent weight loss in 1 month, mild muscle depletions in temporal regions, clavicles, shoulder, hands and LLE.  GOAL:   Patient will meet greater than or equal to 90% of their needs  MONITOR:   PO intake, Supplement acceptance, Labs, Weight trends, Skin  REASON FOR ASSESSMENT:   LOS    ASSESSMENT:   66 y/o male with h/o PVD, HTN, gout, HLD presented to ER secondary to severe pain in R foot (recent thrombectomy, angioplasty and stenting to R LE 11/2; recent R 4th toe amp); now status post R BKA (11/13)   Met with pt and family in room today. Pt reports poor appetite and oral intake for 1 month pta r/t pain. Per chart, pt has lost 6lbs(5%) in one month; this is significant given the time frame. Pt reports his appetite is improving today. Pt eating food brought from outside of the hospital; pt ate sausage and egg biscuit this morning from [employer] and [employer] sandwich yesterday. Pt does like strawberry Ensure; per pt's wife he has been drinking these at home to try and gain weight. RD also recommended daily MVI to promote healing.     Medications reviewed and include: aspirin, colace, ferrous sulfate, lasix, NaCl '@50ml' /hr, vancomycin, oxycodone   Labs reviewed: creat 1.42(H) Wbc- 15.8(H), Hgb 6.4(L), Hct 20.3(L)  Nutrition-Focused physical exam completed. Findings are no fat depletion, mild muscle depletions in temporal regions, clavicles, shoulder, hands and LLE, and mild edema at BKA site.   Diet Order:  Diet regular Room service appropriate? Yes; Fluid consistency: Thin  EDUCATION NEEDS:   Education needs have been  addressed  Skin:  Skin Integrity Issues:(Incision R leg s/p BKA)  Last BM:  11/13  Height:   Ht Readings from Last 1 Encounters:  08/28/17 '5\' 7"'  (1.702 m)    Weight:   Wt Readings from Last 1 Encounters:  08/28/17 125 lb (56.7 kg)    Ideal Body Weight:  63.3 kg(adjusted for BKA)  BMI:  Body mass index is 19.58 kg/m.  Estimated Nutritional Needs:   Kcal:  1700-2000kcal/day   Protein:  74-85g/day   Fluid:  >1.7L/day   Koleen Distance MS, RD, LDN Pager #[phone] After Hours Pager: [phone]

## 2017-08-31 LAB — BPAM RBC
BLOOD PRODUCT EXPIRATION DATE: 201812062359
Blood Product Expiration Date: 201812012359
ISSUE DATE / TIME: 201811161120
ISSUE DATE / TIME: 201811161619
Unit Type and Rh: 5100
Unit Type and Rh: 5100

## 2017-08-31 LAB — TYPE AND SCREEN
ABO/RH(D): O POS
ABO/RH(D): O POS
ANTIBODY SCREEN: NEGATIVE
Antibody Screen: NEGATIVE
UNIT DIVISION: 0
Unit division: 0

## 2017-08-31 LAB — CBC
HCT: 27.5 % — ABNORMAL LOW (ref 40.0–52.0)
Hemoglobin: 9 g/dL — ABNORMAL LOW (ref 13.0–18.0)
MCH: 28.3 pg (ref 26.0–34.0)
MCHC: 32.7 g/dL (ref 32.0–36.0)
MCV: 86.4 fL (ref 80.0–100.0)
PLATELETS: 537 10*3/uL — AB (ref 150–440)
RBC: 3.19 MIL/uL — ABNORMAL LOW (ref 4.40–5.90)
RDW: 14.2 % (ref 11.5–14.5)
WBC: 15.7 10*3/uL — ABNORMAL HIGH (ref 3.8–10.6)

## 2017-08-31 NOTE — Progress Notes (Signed)
3 Days Post-Op   Subjective/Chief Complaint: Doing Well. Minimal Pain.   Objective: Vital signs in last 24 hours: Temp:  [97.5 F (36.4 C)-98.8 F (37.1 C)] 98 F (36.7 C) (11/17 0546) Pulse Rate:  [64-89] 89 (11/17 0909) Resp:  [18-20] 20 (11/17 0546) BP: (98-129)/(47-66) 129/66 (11/17 0909) SpO2:  [99 %-100 %] 99 % (11/17 0546) Last BM Date: 08/27/17  Intake/Output from previous day: 11/16 0701 - 11/17 0700 In: 636.1 [Blood:636.1] Out: 500 [Urine:500] Intake/Output this shift: No intake/output data recorded.  General appearance: alert and no distress Cardio: regular rate and rhythm, S1, S2 normal, no murmur, click, rub or gallop Extremities: Right Stump C/D/I, soft  Lab Results:  Recent Labs    08/30/17 0350 08/30/17 2018 08/31/17 0346  WBC 15.8*  --  15.7*  HGB 6.4* 9.0* 9.0*  HCT 20.3* 27.6* 27.5*  PLT 566*  --  537*   BMET Recent Labs    08/29/17 0439 08/30/17 0350  CREATININE 1.53* 1.42*   PT/INR No results for input(s): LABPROT, INR in the last 72 hours. ABG No results for input(s): PHART, HCO3 in the last 72 hours.  Invalid input(s): PCO2, PO2  Studies/Results: No results found.  Anti-infectives: Anti-infectives (From admission, onward)   Start     Dose/Rate Route Frequency Ordered Stop   08/28/17 0900  vancomycin (VANCOCIN) IVPB 750 mg/150 ml premix     750 mg 150 mL/hr over 60 Minutes Intravenous Every 24 hours 08/27/17 1416     08/27/17 2209  cefUROXime (ZINACEF) 1.5 g in dextrose 5 % 50 mL IVPB     1.5 g 100 mL/hr over 30 Minutes Intravenous 30 min pre-op 08/27/17 2209 08/28/17 1015   08/26/17 1000  vancomycin (VANCOCIN) IVPB 1000 mg/200 mL premix  Status:  Discontinued     1,000 mg 200 mL/hr over 60 Minutes Intravenous Every 24 hours 08/25/17 1156 08/27/17 1416   08/25/17 0945  vancomycin (VANCOCIN) injection 1 g  Status:  Discontinued     1 g Intravenous  Once 08/25/17 0939 08/25/17 0941   08/25/17 0945  piperacillin-tazobactam  (ZOSYN) IVPB 3.375 g     3.375 g 100 mL/hr over 30 Minutes Intravenous  Once 08/25/17 0939 08/25/17 1049   08/25/17 0945  vancomycin (VANCOCIN) IVPB 1000 mg/200 mL premix     1,000 mg 200 mL/hr over 60 Minutes Intravenous  Once 08/25/17 0941 08/25/17 1154      Assessment/Plan: s/p Procedure(s): AMPUTATION BELOW KNEE (Right) HgB stable  Pain controlled Will plan for discharge Sunday, ensure Home health and PT set up for discharge.   LOS: 6 days    Jordan Mills A 08/31/2017

## 2017-08-31 NOTE — Progress Notes (Signed)
Physical Therapy Treatment Patient Details Name: Jordan Mills MRN: [id number] DOB: 12/02/1950 Today's Date: 08/31/2017    History of Present Illness presented to ER secondary to severe pain in R foot (recent thrombectomy, angioplasty and stenting to R LE 11/2; recent R 4th toe amp); now status post R BKA (11/13)    PT Comments    Participated in exercises as described below.  Pt was able to stand and ambulate 150' with walker and min guard.  Gait generally steady with good control.  Pt does state he has steps into his home with rails.  Will plan on stair training tomorrow.    Follow Up Recommendations  Home health PT     Equipment Recommendations  Rolling walker with 5" wheels    Recommendations for Other Services       Precautions / Restrictions Precautions Precautions: Fall Restrictions Weight Bearing Restrictions: No    Mobility  Bed Mobility Overal bed mobility: Modified Independent                Transfers Overall transfer level: Needs assistance Equipment used: Rolling walker (2 wheeled) Transfers: Sit to/from Stand Sit to Stand: Min guard         General transfer comment: cuing for hand placement, static standing balance  Ambulation/Gait Ambulation/Gait assistance: Min guard Ambulation Distance (Feet): 150 Feet Assistive device: Rolling walker (2 wheeled) Gait Pattern/deviations: Step-to pattern   Gait velocity interpretation: Below normal speed for age/gender     Stairs            Wheelchair Mobility    Modified Rankin (Stroke Patients Only)       Balance Overall balance assessment: Needs assistance Sitting-balance support: No upper extremity supported;Feet supported Sitting balance-Leahy Scale: Good     Standing balance support: Bilateral upper extremity supported Standing balance-Leahy Scale: Fair                              Cognition Arousal/Alertness: Awake/alert Behavior During Therapy: WFL for  tasks assessed/performed Overall Cognitive Status: Within Functional Limits for tasks assessed                                        Exercises Other Exercises Other Exercises: Educated on positioning and HEP.  Emphasis on keee ext (quad sets) and hip extenion activities in supine and standing. Other Exercises: Supine quad sets, SLR, heel slides, ab/add and SAQ, standing hip ext x 10    General Comments        Pertinent Vitals/Pain Pain Assessment: No/denies pain    Home Living                      Prior Function            PT Goals (current goals can now be found in the care plan section) Progress towards PT goals: Progressing toward goals    Frequency    7X/week      PT Plan Current plan remains appropriate    Co-evaluation              AM-PAC PT "6 Clicks" Daily Activity  Outcome Measure  Difficulty turning over in bed (including adjusting bedclothes, sheets and blankets)?: None Difficulty moving from lying on back to sitting on the side of the bed? : None Difficulty sitting down on and standing  up from a chair with arms (e.g., wheelchair, bedside commode, etc,.)?: A Little Help needed moving to and from a bed to chair (including a wheelchair)?: A Little Help needed walking in hospital room?: A Little Help needed climbing 3-5 steps with a railing? : A Lot 6 Click Score: 19    End of Session Equipment Utilized During Treatment: Gait belt Activity Tolerance: Patient tolerated treatment well Patient left: in bed;with bed alarm set;with call bell/phone within reach;with family/visitor present         Time: 1051-1106 PT Time Calculation (min) (ACUTE ONLY): 15 min  Charges:  $Gait Training: 8-22 mins                    G Codes:       Chesley Noon, PTA 08/31/17, 11:36 AM

## 2017-09-01 LAB — CREATININE, SERUM: CREATININE: 0.87 mg/dL (ref 0.61–1.24)

## 2017-09-01 MED ORDER — VANCOMYCIN HCL IN DEXTROSE 750-5 MG/150ML-% IV SOLN
750.0000 mg | Freq: Two times a day (BID) | INTRAVENOUS | Status: DC
  Administered 2017-09-01 – 2017-09-02 (×3): 750 mg via INTRAVENOUS
  Filled 2017-09-01 (×4): qty 150

## 2017-09-01 NOTE — Progress Notes (Signed)
Order received by Dr Lorenso Courier to disconinue IVF

## 2017-09-01 NOTE — Progress Notes (Signed)
4 Days Post-Op   Subjective/Chief Complaint: Doing Well. OOB ambulating with walker. Pain well controlled.   Objective: Vital signs in last 24 hours: Temp:  [98.1 F (36.7 C)-98.4 F (36.9 C)] 98.4 F (36.9 C) (11/18 0502) Pulse Rate:  [73-77] 74 (11/18 0502) Resp:  [14-18] 18 (11/18 0502) BP: (113-124)/(49-62) 113/62 (11/18 0502) SpO2:  [99 %-100 %] 100 % (11/18 0502) Last BM Date: 09/01/17  Intake/Output from previous day: 11/17 0701 - 11/18 0700 In: 0  Out: 825 [Urine:825] Intake/Output this shift: Total I/O In: 0  Out: 250 [Urine:250]  General appearance: alert and no distress Extremities: Right stump C/D/I, soft, viable  Lab Results:  Recent Labs    08/30/17 0350 08/30/17 2018 08/31/17 0346  WBC 15.8*  --  15.7*  HGB 6.4* 9.0* 9.0*  HCT 20.3* 27.6* 27.5*  PLT 566*  --  537*   BMET Recent Labs    08/30/17 0350 09/01/17 0400  CREATININE 1.42* 0.87   PT/INR No results for input(s): LABPROT, INR in the last 72 hours. ABG No results for input(s): PHART, HCO3 in the last 72 hours.  Invalid input(s): PCO2, PO2  Studies/Results: No results found.  Anti-infectives: Anti-infectives (From admission, onward)   Start     Dose/Rate Route Frequency Ordered Stop   08/28/17 0900  vancomycin (VANCOCIN) IVPB 750 mg/150 ml premix     750 mg 150 mL/hr over 60 Minutes Intravenous Every 24 hours 08/27/17 1416     08/27/17 2209  cefUROXime (ZINACEF) 1.5 g in dextrose 5 % 50 mL IVPB     1.5 g 100 mL/hr over 30 Minutes Intravenous 30 min pre-op 08/27/17 2209 08/28/17 1015   08/26/17 1000  vancomycin (VANCOCIN) IVPB 1000 mg/200 mL premix  Status:  Discontinued     1,000 mg 200 mL/hr over 60 Minutes Intravenous Every 24 hours 08/25/17 1156 08/27/17 1416   08/25/17 0945  vancomycin (VANCOCIN) injection 1 g  Status:  Discontinued     1 g Intravenous  Once 08/25/17 0939 08/25/17 0941   08/25/17 0945  piperacillin-tazobactam (ZOSYN) IVPB 3.375 g     3.375 g 100 mL/hr  over 30 Minutes Intravenous  Once 08/25/17 0939 08/25/17 1049   08/25/17 0945  vancomycin (VANCOCIN) IVPB 1000 mg/200 mL premix     1,000 mg 200 mL/hr over 60 Minutes Intravenous  Once 08/25/17 0941 08/25/17 1154      Assessment/Plan: s/p Procedure(s): AMPUTATION BELOW KNEE (Right) Will plan for D/C tomorrow with home health care  LOS: 7 days    Jamesetta So A 09/01/2017

## 2017-09-01 NOTE — Progress Notes (Signed)
ANTIBIOTIC CONSULT NOTE -FOLLOW UP   Pharmacy Consult for Vancomycin Indication: right foot gangrene   No Known Allergies  Patient Measurements: Height: 5\' 7"  (170.2 cm) Weight: 125 lb (56.7 kg) IBW/kg (Calculated) : 66.1 Adjusted Body Weight:   Vital Signs: Temp: 98.4 F (36.9 C) (11/18 0502) Temp Source: Oral (11/18 0502) BP: 113/62 (11/18 0502) Pulse Rate: 74 (11/18 0502) Intake/Output from previous day: 11/17 0701 - 11/18 0700 In: 0  Out: 825 [Urine:825] Intake/Output from this shift: Total I/O In: 0  Out: 250 [Urine:250]  Labs: Recent Labs    08/30/17 0350 08/30/17 2018 08/31/17 0346 09/01/17 0400  WBC 15.8*  --  15.7*  --   HGB 6.4* 9.0* 9.0*  --   PLT 566*  --  537*  --   CREATININE 1.42*  --   --  0.87   Estimated Creatinine Clearance: 65.2 mL/min (by C-G formula based on SCr of 0.87 mg/dL). Recent Labs    08/30/17 0725  [place] 16     Microbiology: Recent Results (from the past 720 hour(s))  MRSA PCR Screening     Status: None   Collection Time: 08/15/17  5:25 PM  Result Value Ref Range Status   MRSA by PCR NEGATIVE NEGATIVE Final    Comment:        The GeneXpert MRSA Assay (FDA approved for NASAL specimens only), is one component of a comprehensive MRSA colonization surveillance program. It is not intended to diagnose MRSA infection nor to guide or monitor treatment for MRSA infections.   Culture, blood (routine x 2)     Status: None   Collection Time: 08/25/17  9:42 AM  Result Value Ref Range Status   Specimen Description BLOOD BLOOD LEFT WRIST  Final   Special Requests   Final    BOTTLES DRAWN AEROBIC AND ANAEROBIC Blood Culture results may not be optimal due to an excessive volume of blood received in culture bottles   Culture NO GROWTH 5 DAYS  Final   Report Status 08/30/2017 FINAL  Final  Culture, blood (routine x 2)     Status: None   Collection Time: 08/25/17  9:42 AM  Result Value Ref Range Status   Specimen Description  BLOOD BLOOD LEFT FOREARM  Final   Special Requests   Final    BOTTLES DRAWN AEROBIC AND ANAEROBIC Blood Culture results may not be optimal due to an excessive volume of blood received in culture bottles   Culture NO GROWTH 5 DAYS  Final   Report Status 08/30/2017 FINAL  Final  Surgical PCR screen     Status: None   Collection Time: 08/25/17  4:50 PM  Result Value Ref Range Status   MRSA, PCR NEGATIVE NEGATIVE Final   Staphylococcus aureus NEGATIVE NEGATIVE Final    Comment: (NOTE) The Xpert SA Assay (FDA approved for NASAL specimens in patients 70 years of age and older), is one component of a comprehensive surveillance program. It is not intended to diagnose infection nor to guide or monitor treatment.     Medical History: Past Medical History:  Diagnosis Date  . Gout   . HLD (hyperlipidemia)   . Hypertension   . Peripheral vascular disease (HCC)     Medications:  Medications Prior to Admission  Medication Sig Dispense Refill Last Dose  . amLODipine (NORVASC) 5 MG tablet Take 5 mg by mouth 2 (two) times daily.  10 08/25/2017 at Unknown time  . apixaban (ELIQUIS) 5 MG TABS tablet Take 1 tablet (5  mg total) 2 (two) times daily by mouth. 60 tablet 1 08/25/2017 at Unknown time  . aspirin EC 81 MG tablet Take by mouth.   08/25/2017 at Unknown time  . atorvastatin (LIPITOR) 10 MG tablet Take 1 tablet (10 mg total) by mouth daily. 30 tablet 11 08/25/2017 at Unknown time  . clopidogrel (PLAVIX) 75 MG tablet Take 1 tablet (75 mg total) by mouth daily. 30 tablet 11 08/25/2017 at Unknown time  . [EXPIRED] HYDROcodone-acetaminophen (NORCO/VICODIN) 5-325 MG tablet Take 1-2 tablets every 4 (four) hours as needed for up to 10 days by mouth for moderate pain. 30 tablet 0 08/25/2017 at Unknown time  . lisinopril-hydrochlorothiazide (PRINZIDE,ZESTORETIC) 20-12.5 MG tablet Take 2 tablets by mouth daily.  5 08/25/2017 at Unknown time  . metoprolol (LOPRESSOR) 50 MG tablet Take 50 mg by mouth 2  (two) times daily.  4 08/25/2017 at Unknown time   Scheduled:  . amLODipine  5 mg Oral BID  . apixaban  5 mg Oral BID  . aspirin EC  81 mg Oral Daily  . atorvastatin  10 mg Oral q1800  . docusate  50 mg Oral Daily  . feeding supplement (ENSURE ENLIVE)  237 mL Oral BID BM  . ferrous sulfate  325 mg Oral BID WC  . lisinopril  20 mg Oral Daily   And  . hydrochlorothiazide  12.5 mg Oral Daily  . metoprolol tartrate  50 mg Oral BID  . multivitamin with minerals  1 tablet Oral Daily   Assessment: Pharmacy to dose and monitor Vancomycin in this 66 year old male. Patient previously receiving Vancomycin 1 g IV q24 hours. Based on PK parameters, patient would need a reduced dose of Vancomycin to prevent supratherapeutic levels.   Goal of Therapy:  Vancomycin trough level 15-20 mcg/ml  Plan:  Vancomycin Trough level resulted @ 16. Will continue Vancomycin 750 mg IV q24 hours.   11/18: Scr improved to 0.87 from 1.42.  Will adjust Vancomycin to 750 mg IV Q12h.  Ke 0.059  T1/2 11.75  Vd 39.7  Trough prior to 5th dose on 11/20 at 1100.  Jaivian Battaglini A 09/01/2017,11:15 AM

## 2017-09-01 NOTE — Progress Notes (Signed)
Physical Therapy Treatment Patient Details Name: Jordan Mills MRN: [id number] DOB: [birth year] Today's Date: 09/01/2017    History of Present Illness presented to ER secondary to severe pain in R foot (recent thrombectomy, angioplasty and stenting to R LE 11/2; recent R 4th toe amp); now status post R BKA (11/13)    PT Comments    Pt progressing well towards goals.  Pt improved to SBA with transfers and amb this session.  Pt able to amb 150' with RW with very good stability including during 180 deg turns and backward ambulation.  Practiced ascending and descending 4 stairs with B rails, B axillary crutches which pt owns at home, and with one crutch and one rail.  Pt steady with B rails and with one rail and one crutch but was not comfortable with B axillary crutches.  Pt and family training on guarding and proper sequencing during step training. Pt will benefit from HHPT services upon discharge to safely address above deficits for decreased caregiver assistance and eventual return to PLOF.    Follow Up Recommendations  Home health PT     Equipment Recommendations  Rolling walker with 5" wheels;Wheelchair (measurements PT)    Recommendations for Other Services       Precautions / Restrictions Precautions Precautions: Fall Restrictions Weight Bearing Restrictions: No    Mobility  Bed Mobility Overal bed mobility: Independent                Transfers Overall transfer level: Needs assistance Equipment used: Rolling walker (2 wheeled) Transfers: Sit to/from Stand Sit to Stand: Supervision         General transfer comment: Pt steady with sit to stand but required min verbal cues for hand placement during stand to sit for improved eccentric control.  Ambulation/Gait Ambulation/Gait assistance: Supervision Ambulation Distance (Feet): 150 Feet Assistive device: Rolling walker (2 wheeled) Gait Pattern/deviations: Step-to pattern   Gait velocity interpretation:  Below normal speed for age/gender General Gait Details: Pt very steady with good control with RW during gait including during 180 deg turn training and backward amb training.     Stairs Stairs: Yes   Stair Management: One rail Right;With crutches Number of Stairs: 4 General stair comments: Practiced ascending and descending stairs with B rails, B axillary crutches which pt owns at home, and with one crutch and one rail.  Pt steady with B rails and with one rail and one crutch but was not comfortable with B axillary crutches.  Pt and family training on guarding and proper sequencing during step training.   Wheelchair Mobility    Modified Rankin (Stroke Patients Only)       Balance Overall balance assessment: Needs assistance Sitting-balance support: No upper extremity supported;Feet supported Sitting balance-Leahy Scale: Normal     Standing balance support: Bilateral upper extremity supported Standing balance-Leahy Scale: Good                              Cognition Arousal/Alertness: Awake/alert Behavior During Therapy: WFL for tasks assessed/performed Overall Cognitive Status: Within Functional Limits for tasks assessed                                        Exercises Total Joint Exercises Quad Sets: AROM;Strengthening;Right Long Arc Quad: AROM;Both;10 reps;15 reps Knee Flexion: AROM;Both;10 reps;15 reps Other Exercises Other Exercises: Educated on positioning  and HEP.  Emphasis on keee ext (quad sets and LAQ).    General Comments        Pertinent Vitals/Pain Pain Assessment: No/denies pain    Home Living                      Prior Function            PT Goals (current goals can now be found in the care plan section) Progress towards PT goals: Progressing toward goals    Frequency    7X/week      PT Plan Current plan remains appropriate    Co-evaluation              AM-PAC PT "6 Clicks" Daily Activity   Outcome Measure                   End of Session Equipment Utilized During Treatment: Gait belt Activity Tolerance: Patient tolerated treatment well Patient left: in bed;with bed alarm set;with call bell/phone within reach;with family/visitor present Nurse Communication: Mobility status PT Visit Diagnosis: Muscle weakness (generalized) (M62.81);Difficulty in walking, not elsewhere classified (R26.2)     Time: 6837-2902 PT Time Calculation (min) (ACUTE ONLY): 40 min  Charges:  $Gait Training: 23-37 mins $Therapeutic Exercise: 8-22 mins                    G Codes:       DRoyetta Asal PT, DPT 09/01/17, 10:42 AM

## 2017-09-02 MED ORDER — OXYCODONE-ACETAMINOPHEN 5-325 MG PO TABS: 1.0000 | ORAL_TABLET | ORAL | 0 refills | Status: DC | PRN

## 2017-09-02 MED ORDER — FERROUS SULFATE 325 (65 FE) MG PO TABS: 325.0000 mg | ORAL_TABLET | Freq: Two times a day (BID) | ORAL | 3 refills | Status: DC

## 2017-09-02 MED ORDER — DOCUSATE SODIUM 50 MG/5ML PO LIQD: 50.0000 mg | Freq: Every day | ORAL | 0 refills | Status: DC

## 2017-09-02 NOTE — Progress Notes (Signed)
[facility]  Daily Progress Note   Subjective  - 5 Days Post-Op  Doing well. Only took two pain pills last night Ready to go home  Objective Vitals:   09/01/17 2105 09/01/17 2307 09/02/17 0535 09/02/17 0934  BP: (!) 123/59 135/72 (!) 119/58 (!) 117/51  Pulse: 93 74 72 80  Resp: 16  18   Temp: 98.8 F (37.1 C)  98.5 F (36.9 C) 97.8 F (36.6 C)  TempSrc: Oral  Oral Oral  SpO2: 100%  99% 99%  Weight:      Height:        Intake/Output Summary (Last 24 hours) at 09/02/2017 1139 Last data filed at 09/02/2017 0700 Gross per 24 hour  Intake 300 ml  Output 1225 ml  Net -925 ml    PULM  CTAB CV  RRR VASC  Right stump C/D/I  Laboratory CBC    Component Value Date/Time   WBC 15.7 (H) 08/31/2017 0346   HGB 9.0 (L) 08/31/2017 0346   HCT 27.5 (L) 08/31/2017 0346   PLT 537 (H) 08/31/2017 0346    BMET    Component Value Date/Time   NA 137 08/26/2017 0004   K 4.0 08/26/2017 0004   CL 104 08/26/2017 0004   CO2 24 08/26/2017 0004   GLUCOSE 128 (H) 08/26/2017 0004   BUN 42 (H) 08/26/2017 0004   CREATININE 0.87 09/01/2017 0400   CALCIUM 9.1 08/26/2017 0004   GFRNONAA >60 09/01/2017 0400   GFRAA >60 09/01/2017 0400    Assessment/Planning: POD #5 s/p right BKA   Doing well  Home today with home health  RTC two weeks for wound check, staple removal    [name]  09/02/2017, 11:39 AM

## 2017-09-02 NOTE — Care Management Note (Signed)
Case Management Note  Patient Details  Name: Jordan Mills MRN: [id number] Date of Birth: 02/02/1951  Subjective/Objective: Spoke with granddaughter, Jordan Mills. Patient has been receiving services with [facility]. They no longer want to use that agency. Referral has been made to [facility] by another [facility]. [facility] updated. [facility] notified of discharge and need for RN, PT and OT. Patient refused a HHA.  Ordered walker and BSC from [facility].  He will discharge home by car.                     Action/Plan: [facility] for RN, PT and OT. DME ordered for delivery today.   Expected Discharge Date:   09/02/2017               Expected Discharge Plan:  [place]  In-House Referral:     Discharge planning Services  CM Consult  Post Acute Care Choice:  Durable Medical Equipment, Home Health Choice offered to:  (grandaughter, [place])  DME Arranged:  Bedside commode, Environmental consultant DME Agency:  [facility] Arranged:  RN, PT, OT [facility] Agency:  [facility]  Status of Service:  Completed, signed off  If discussed at [place] of Stay Meetings, dates discussed:    Additional Comments:  Jolly Mango, RN 09/02/2017, 11:43 AM

## 2017-09-02 NOTE — Progress Notes (Signed)
Discharge instructions reviewed with the patient and his granddaughter.  Family is very supportive of the patient.  Dressing changed and patient pushed out via wheelchair to the car

## 2017-09-02 NOTE — Discharge Summary (Signed)
[facility]    Discharge Summary    Patient ID:  Jordan Mills MRN: [id number] DOB/AGE: 68/07/1951 66 y.o.  Admit date: 08/25/2017 Discharge date: 09/02/2017 Date of Surgery: 08/28/2017 Surgeon: Surgeon(s): Dew, Erskine Squibb, MD  Admission Diagnosis: Arterial insufficiency with ischemic ulcer ([place]) [I77.1, L98.499] Acute renal failure, unspecified acute renal failure type ([place]) [N17.9] Gangrene of toe of right foot ([place]) [I96]  Discharge Diagnoses:  Arterial insufficiency with ischemic ulcer ([place]) [I77.1, L98.499] Acute renal failure, unspecified acute renal failure type ([place]) [N17.9] Gangrene of toe of right foot ([employer]) [I96]  Secondary Diagnoses: Past Medical History:  Diagnosis Date  . Gout   . HLD (hyperlipidemia)   . Hypertension   . Peripheral vascular disease ([place])     Procedure(s): AMPUTATION BELOW KNEE  Discharged Condition: good  HPI:  Patient with PAD and gangrene of right foot, severe pain.  Needed right BKA.  Hospital Course:  Jordan Mills is a 66 y.o. male is S/P Right Procedure(s): AMPUTATION BELOW KNEE Extubated: POD # 0 Physical exam: AF/VSS.  Post-op wounds clean, dry, intact or healing well Pt. Ambulating, voiding and taking PO diet without difficulty. Pt pain controlled with PO pain meds. Labs as below Complications:none  Consults:    Significant Diagnostic Studies: CBC Lab Results  Component Value Date   WBC 15.7 (H) 08/31/2017   HGB 9.0 (L) 08/31/2017   HCT 27.5 (L) 08/31/2017   MCV 86.4 08/31/2017   PLT 537 (H) 08/31/2017    BMET    Component Value Date/Time   NA 137 08/26/2017 0004   K 4.0 08/26/2017 0004   CL 104 08/26/2017 0004   CO2 24 08/26/2017 0004   GLUCOSE 128 (H) 08/26/2017 0004   BUN 42 (H) 08/26/2017 0004   CREATININE 0.87 09/01/2017 0400   CALCIUM 9.1 08/26/2017 0004   GFRNONAA >60 09/01/2017 0400   GFRAA >60 09/01/2017 0400   COAG Lab Results  Component Value Date    INR 2.01 08/25/2017     Disposition:  Discharge to :Home Discharge Instructions    Call MD for:  redness, tenderness, or signs of infection (pain, swelling, bleeding, redness, odor or green/yellow discharge around incision site)   Complete by:  As directed    Call MD for:  severe or increased pain, loss or decreased feeling  in affected limb(s)   Complete by:  As directed    Call MD for:  temperature >100.5   Complete by:  As directed    Change dressing (specify)   Complete by:  As directed    Dressing change: daily or every other day with vasoline gauze and Kerlex and ACE wrap   Driving Restrictions   Complete by:  As directed    No driving until follow up visit in office   Resume previous diet   Complete by:  As directed      Allergies as of 09/02/2017   No Known Allergies     Medication List    STOP taking these medications   apixaban 5 MG Tabs tablet Commonly known as:  ELIQUIS   HYDROcodone-acetaminophen 5-325 MG tablet Commonly known as:  NORCO/VICODIN     TAKE these medications   amLODipine 5 MG tablet Commonly known as:  NORVASC Take 5 mg by mouth 2 (two) times daily.   aspirin EC 81 MG tablet Take by mouth.   atorvastatin 10 MG tablet Commonly known as:  LIPITOR Take 1 tablet (10 mg total) by mouth daily.  clopidogrel 75 MG tablet Commonly known as:  PLAVIX Take 1 tablet (75 mg total) by mouth daily.   docusate 50 MG/5ML liquid Commonly known as:  COLACE Take 5 mLs (50 mg total) daily by mouth. Start taking on:  09/03/2017   ferrous sulfate 325 (65 FE) MG tablet Take 1 tablet (325 mg total) 2 (two) times daily with a meal by mouth.   lisinopril-hydrochlorothiazide 20-12.5 MG tablet Commonly known as:  PRINZIDE,ZESTORETIC Take 2 tablets by mouth daily.   metoprolol tartrate 50 MG tablet Commonly known as:  LOPRESSOR Take 50 mg by mouth 2 (two) times daily.   oxyCODONE-acetaminophen 5-325 MG tablet Commonly known as:   PERCOCET/ROXICET Take 1-2 tablets every 4 (four) hours as needed by mouth for moderate pain.            Durable Medical Equipment  (From admission, onward)        Start     Ordered   09/02/17 1139  For home use only DME Walker rolling  Once    Question:  Patient needs a walker to treat with the following condition  Answer:  S/P BKA (below knee amputation) unilateral, right ([place])   09/02/17 1138   09/02/17 1139  For home use only DME Bedside commode  Once    Question:  Patient needs a bedside commode to treat with the following condition  Answer:  S/P BKA (below knee amputation) unilateral, right ([place])   09/02/17 1138       Discharge Care Instructions  (From admission, onward)        Start     Ordered   09/02/17 0000  Change dressing (specify)    Comments:  Dressing change: daily or every other day with vasoline gauze and Kerlex and ACE wrap   09/02/17 1145     Verbal and written Discharge instructions given to the patient. Wound care per Discharge AVS Follow-up Information    Stegmayer, Janalyn Harder, PA-C Follow up in 2 week(s).   Specialty:  Physician Assistant Why:  for wound check, staple removal Contact information: [address] [place] [address] [phone]           Signed: [name], MD  09/02/2017, 11:46 AM

## 2017-09-03 ENCOUNTER — Telehealth (INDEPENDENT_AMBULATORY_CARE_PROVIDER_SITE_OTHER): Payer: Self-pay | Admitting: Vascular Surgery

## 2017-09-03 DIAGNOSIS — Z9981 Dependence on supplemental oxygen: Secondary | ICD-10-CM | POA: Diagnosis not present

## 2017-09-03 DIAGNOSIS — I739 Peripheral vascular disease, unspecified: Secondary | ICD-10-CM | POA: Diagnosis not present

## 2017-09-03 DIAGNOSIS — I10 Essential (primary) hypertension: Secondary | ICD-10-CM | POA: Diagnosis not present

## 2017-09-03 DIAGNOSIS — M109 Gout, unspecified: Secondary | ICD-10-CM | POA: Diagnosis not present

## 2017-09-03 DIAGNOSIS — Z4781 Encounter for orthopedic aftercare following surgical amputation: Secondary | ICD-10-CM | POA: Diagnosis not present

## 2017-09-03 DIAGNOSIS — Z89511 Acquired absence of right leg below knee: Secondary | ICD-10-CM | POA: Diagnosis not present

## 2017-09-03 DIAGNOSIS — Z7982 Long term (current) use of aspirin: Secondary | ICD-10-CM | POA: Diagnosis not present

## 2017-09-03 DIAGNOSIS — Z9181 History of falling: Secondary | ICD-10-CM | POA: Diagnosis not present

## 2017-09-03 DIAGNOSIS — Z87891 Personal history of nicotine dependence: Secondary | ICD-10-CM | POA: Diagnosis not present

## 2017-09-03 DIAGNOSIS — E785 Hyperlipidemia, unspecified: Secondary | ICD-10-CM | POA: Diagnosis not present

## 2017-09-03 NOTE — Telephone Encounter (Signed)
For what reason?   

## 2017-09-03 NOTE — Telephone Encounter (Signed)
New Message  Pts daughter or granddaughter verbalized wanting amoxicillin to be prescribed.  Please f/u

## 2017-09-03 NOTE — Telephone Encounter (Signed)
Called the patient back to let him know that there will be no amoxicillin prescribed at this time. The patient states that the amoxicillin was originally prescribed by Dr. Elvina Mattes.

## 2017-09-06 DIAGNOSIS — I739 Peripheral vascular disease, unspecified: Secondary | ICD-10-CM | POA: Diagnosis not present

## 2017-09-06 DIAGNOSIS — E785 Hyperlipidemia, unspecified: Secondary | ICD-10-CM | POA: Diagnosis not present

## 2017-09-06 DIAGNOSIS — I10 Essential (primary) hypertension: Secondary | ICD-10-CM | POA: Diagnosis not present

## 2017-09-06 DIAGNOSIS — Z89511 Acquired absence of right leg below knee: Secondary | ICD-10-CM | POA: Diagnosis not present

## 2017-09-06 DIAGNOSIS — M109 Gout, unspecified: Secondary | ICD-10-CM | POA: Diagnosis not present

## 2017-09-06 DIAGNOSIS — Z4781 Encounter for orthopedic aftercare following surgical amputation: Secondary | ICD-10-CM | POA: Diagnosis not present

## 2017-09-10 ENCOUNTER — Telehealth (INDEPENDENT_AMBULATORY_CARE_PROVIDER_SITE_OTHER): Payer: Self-pay

## 2017-09-10 ENCOUNTER — Ambulatory Visit (INDEPENDENT_AMBULATORY_CARE_PROVIDER_SITE_OTHER): Payer: Medicare Other | Admitting: Vascular Surgery

## 2017-09-10 ENCOUNTER — Encounter (INDEPENDENT_AMBULATORY_CARE_PROVIDER_SITE_OTHER): Payer: Self-pay | Admitting: Vascular Surgery

## 2017-09-10 VITALS — BP 106/62 | HR 78 | Resp 16 | Ht 67.0 in

## 2017-09-10 DIAGNOSIS — Z4781 Encounter for orthopedic aftercare following surgical amputation: Secondary | ICD-10-CM | POA: Diagnosis not present

## 2017-09-10 DIAGNOSIS — E785 Hyperlipidemia, unspecified: Secondary | ICD-10-CM

## 2017-09-10 DIAGNOSIS — Z89511 Acquired absence of right leg below knee: Secondary | ICD-10-CM | POA: Diagnosis not present

## 2017-09-10 DIAGNOSIS — I70261 Atherosclerosis of native arteries of extremities with gangrene, right leg: Secondary | ICD-10-CM

## 2017-09-10 DIAGNOSIS — I10 Essential (primary) hypertension: Secondary | ICD-10-CM | POA: Diagnosis not present

## 2017-09-10 DIAGNOSIS — I739 Peripheral vascular disease, unspecified: Secondary | ICD-10-CM | POA: Diagnosis not present

## 2017-09-10 DIAGNOSIS — M109 Gout, unspecified: Secondary | ICD-10-CM | POA: Diagnosis not present

## 2017-09-10 NOTE — Assessment & Plan Note (Signed)
Amputation is healing well.  All in all, the patient seems to be doing pretty well.  I am going to push back his follow-up visit another week or so and we will wait till about 4 weeks to take out his staples.  We have wrapped his leg and somewhat of a firm Ace wrap today and that can stay on for the next 2 days.  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  He has remained abstinent from tobacco.  I did give him 1 more refill on his pain medications but have asked him to start trying to take aspirin, Tylenol, or ibuprofen for discomfort.

## 2017-09-10 NOTE — Progress Notes (Signed)
Patient ID: Jordan Mills, male   DOB: [birth year], 66 y.o.   MRN: [id number]  Chief Complaint  Patient presents with  . Follow-up    2wk     HPI Jordan Mills is a 66 y.o. male.  Patient returns prior to his scheduled follow-up visit as he had a fall and has had some bleeding from his stump.  The bleeding is quite a small amount from the medial aspect of the stump and does not appear worrisome.  The skin is all intact without any open ulceration, infection, or wound breakdown.  He still has some phantom Mills as well as some discomfort from his surgery but he has been weaning off of his Mills medications.  No fevers or chills.   Past Medical History:  Diagnosis Date  . Gout   . HLD (hyperlipidemia)   . Hypertension   . Peripheral vascular disease [facility])     Past Surgical History:  Procedure Laterality Date  . AMPUTATION Right 08/28/2017   Procedure: AMPUTATION BELOW KNEE;  Surgeon: Jordan Huxley, MD;  Location: [facility] ORS;  Service: Vascular;  Laterality: Right;  . AMPUTATION TOE Right 07/19/2017   Procedure: AMPUTATION TOE/Rt 4th MPJ;  Surgeon: Jordan Mills, DPM;  Location: [facility] ORS;  Service: Podiatry;  Laterality: Right;  . LOWER EXTREMITY ANGIOGRAPHY Right 07/01/2017   Procedure: Lower Extremity Angiography;  Surgeon: Jordan Huxley, MD;  Location: [place] [facility];  Service: Cardiovascular;  Laterality: Right;  . LOWER EXTREMITY ANGIOGRAPHY Right 08/15/2017   Procedure: Lower Extremity Angiography;  Surgeon: Jordan Huxley, MD;  Location: [place] [facility];  Service: Cardiovascular;  Laterality: Right;  . LOWER EXTREMITY ANGIOGRAPHY Right 08/16/2017   Procedure: LOWER EXTREMITY ANGIOGRAPHY;  Surgeon: Jordan Huxley, MD;  Location: [place] [facility];  Service: Cardiovascular;  Laterality: Right;  . LOWER EXTREMITY INTERVENTION  08/15/2017   Procedure: LOWER EXTREMITY INTERVENTION;  Surgeon: Jordan Huxley, MD;  Location: [place] [facility];  Service: Cardiovascular;;  .  TONSILLECTOMY        No Known Allergies  Current Outpatient Medications  Medication Sig Dispense Refill  . amLODipine (NORVASC) 5 MG tablet Take 5 mg by mouth 2 (two) times daily.  10  . aspirin EC 81 MG tablet Take by mouth.    Marland Kitchen atorvastatin (LIPITOR) 10 MG tablet Take 1 tablet (10 mg total) by mouth daily. 30 tablet 11  . clopidogrel (PLAVIX) 75 MG tablet Take 1 tablet (75 mg total) by mouth daily. 30 tablet 11  . docusate (COLACE) 50 MG/5ML liquid Take 5 mLs (50 mg total) daily by mouth. 100 mL 0  . ferrous sulfate 325 (65 FE) MG tablet Take 1 tablet (325 mg total) 2 (two) times daily with a meal by mouth. 60 tablet 3  . lisinopril-hydrochlorothiazide (PRINZIDE,ZESTORETIC) 20-12.5 MG tablet Take 2 tablets by mouth daily.  5  . metoprolol (LOPRESSOR) 50 MG tablet Take 50 mg by mouth 2 (two) times daily.  4  . multivitamin-iron-minerals-folic acid (CENTRUM) chewable tablet Chew 1 tablet by mouth daily.    Marland Kitchen oxyCODONE-acetaminophen (PERCOCET/ROXICET) 5-325 MG tablet Take 1-2 tablets every 4 (four) hours as needed by mouth for moderate Mills. 30 tablet 0   No current facility-administered medications for this visit.         Physical Exam BP 106/62   Pulse 78   Resp 16   Ht 5\' 7"  (1.702 m)   BMI 19.58 kg/m  Gen:  WD/WN, NAD  Skin: incision C/D/I. The bleeding is quite a small amount from the medial aspect of the stump and does not appear worrisome.  The skin is all intact without any open ulceration, infection, or wound breakdown     Assessment/Plan:  Hypertension blood pressure control important in reducing the progression of atherosclerotic disease. On appropriate oral medications.   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  All in all, the patient seems to be doing pretty well.  I am going to push back his follow-up visit another week or so and we will wait till about 4 weeks to take out his staples.  We have wrapped his leg and  somewhat of a firm Ace wrap today and that can stay on for the next 2 days.  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  He has remained abstinent from tobacco.  I did give him 1 more refill on his Mills medications but have asked him to start trying to take aspirin, Tylenol, or ibuprofen for discomfort.      Jordan Mills 09/10/2017, 5:57 PM   This note was created with Dragon medical transcription system.  Any errors from dictation are unintentional.

## 2017-09-10 NOTE — Assessment & Plan Note (Signed)
blood pressure control important in reducing the progression of atherosclerotic disease. On appropriate oral medications.  

## 2017-09-10 NOTE — Telephone Encounter (Signed)
Patient has an appt today in our office to be seen and this was relayed to the patient's daughter. The daughter was also told that if he is profusely bleeding then he should be seen at the ED for evaluation otherwise keep his appt. See note below.

## 2017-09-10 NOTE — Telephone Encounter (Signed)
Patient's daughter called stating the patient had a fall on his amputation which is now swollen and bleeding some.

## 2017-09-12 DIAGNOSIS — M109 Gout, unspecified: Secondary | ICD-10-CM | POA: Diagnosis not present

## 2017-09-12 DIAGNOSIS — E785 Hyperlipidemia, unspecified: Secondary | ICD-10-CM | POA: Diagnosis not present

## 2017-09-12 DIAGNOSIS — Z89511 Acquired absence of right leg below knee: Secondary | ICD-10-CM | POA: Diagnosis not present

## 2017-09-12 DIAGNOSIS — I10 Essential (primary) hypertension: Secondary | ICD-10-CM | POA: Diagnosis not present

## 2017-09-12 DIAGNOSIS — Z4781 Encounter for orthopedic aftercare following surgical amputation: Secondary | ICD-10-CM | POA: Diagnosis not present

## 2017-09-12 DIAGNOSIS — I739 Peripheral vascular disease, unspecified: Secondary | ICD-10-CM | POA: Diagnosis not present

## 2017-09-16 ENCOUNTER — Ambulatory Visit (INDEPENDENT_AMBULATORY_CARE_PROVIDER_SITE_OTHER): Payer: Medicare Other | Admitting: Vascular Surgery

## 2017-09-17 DIAGNOSIS — Z89511 Acquired absence of right leg below knee: Secondary | ICD-10-CM | POA: Diagnosis not present

## 2017-09-17 DIAGNOSIS — I739 Peripheral vascular disease, unspecified: Secondary | ICD-10-CM | POA: Diagnosis not present

## 2017-09-17 DIAGNOSIS — M109 Gout, unspecified: Secondary | ICD-10-CM | POA: Diagnosis not present

## 2017-09-17 DIAGNOSIS — I10 Essential (primary) hypertension: Secondary | ICD-10-CM | POA: Diagnosis not present

## 2017-09-17 DIAGNOSIS — Z4781 Encounter for orthopedic aftercare following surgical amputation: Secondary | ICD-10-CM | POA: Diagnosis not present

## 2017-09-17 DIAGNOSIS — E785 Hyperlipidemia, unspecified: Secondary | ICD-10-CM | POA: Diagnosis not present

## 2017-09-18 ENCOUNTER — Telehealth (INDEPENDENT_AMBULATORY_CARE_PROVIDER_SITE_OTHER): Payer: Self-pay

## 2017-09-18 DIAGNOSIS — Z4781 Encounter for orthopedic aftercare following surgical amputation: Secondary | ICD-10-CM | POA: Diagnosis not present

## 2017-09-18 DIAGNOSIS — I10 Essential (primary) hypertension: Secondary | ICD-10-CM | POA: Diagnosis not present

## 2017-09-18 DIAGNOSIS — I739 Peripheral vascular disease, unspecified: Secondary | ICD-10-CM | POA: Diagnosis not present

## 2017-09-18 DIAGNOSIS — M109 Gout, unspecified: Secondary | ICD-10-CM | POA: Diagnosis not present

## 2017-09-18 DIAGNOSIS — E785 Hyperlipidemia, unspecified: Secondary | ICD-10-CM | POA: Diagnosis not present

## 2017-09-18 DIAGNOSIS — Z89511 Acquired absence of right leg below knee: Secondary | ICD-10-CM | POA: Diagnosis not present

## 2017-09-18 NOTE — Telephone Encounter (Signed)
Aldridge called from [facility] wanting a verbal order to set up home health PT twice weekly for four weeks. I let him know that will be okay. He will also send over paperwork to be signed.

## 2017-09-20 DIAGNOSIS — M109 Gout, unspecified: Secondary | ICD-10-CM | POA: Diagnosis not present

## 2017-09-20 DIAGNOSIS — E785 Hyperlipidemia, unspecified: Secondary | ICD-10-CM | POA: Diagnosis not present

## 2017-09-20 DIAGNOSIS — I10 Essential (primary) hypertension: Secondary | ICD-10-CM | POA: Diagnosis not present

## 2017-09-20 DIAGNOSIS — Z4781 Encounter for orthopedic aftercare following surgical amputation: Secondary | ICD-10-CM | POA: Diagnosis not present

## 2017-09-20 DIAGNOSIS — I739 Peripheral vascular disease, unspecified: Secondary | ICD-10-CM | POA: Diagnosis not present

## 2017-09-20 DIAGNOSIS — Z89511 Acquired absence of right leg below knee: Secondary | ICD-10-CM | POA: Diagnosis not present

## 2017-09-23 ENCOUNTER — Ambulatory Visit (INDEPENDENT_AMBULATORY_CARE_PROVIDER_SITE_OTHER): Payer: Medicare Other | Admitting: Vascular Surgery

## 2017-09-25 DIAGNOSIS — E785 Hyperlipidemia, unspecified: Secondary | ICD-10-CM | POA: Diagnosis not present

## 2017-09-25 DIAGNOSIS — I739 Peripheral vascular disease, unspecified: Secondary | ICD-10-CM | POA: Diagnosis not present

## 2017-09-25 DIAGNOSIS — Z89511 Acquired absence of right leg below knee: Secondary | ICD-10-CM | POA: Diagnosis not present

## 2017-09-25 DIAGNOSIS — I10 Essential (primary) hypertension: Secondary | ICD-10-CM | POA: Diagnosis not present

## 2017-09-25 DIAGNOSIS — Z4781 Encounter for orthopedic aftercare following surgical amputation: Secondary | ICD-10-CM | POA: Diagnosis not present

## 2017-09-25 DIAGNOSIS — M109 Gout, unspecified: Secondary | ICD-10-CM | POA: Diagnosis not present

## 2017-09-27 DIAGNOSIS — E785 Hyperlipidemia, unspecified: Secondary | ICD-10-CM | POA: Diagnosis not present

## 2017-09-27 DIAGNOSIS — M109 Gout, unspecified: Secondary | ICD-10-CM | POA: Diagnosis not present

## 2017-09-27 DIAGNOSIS — Z89511 Acquired absence of right leg below knee: Secondary | ICD-10-CM | POA: Diagnosis not present

## 2017-09-27 DIAGNOSIS — Z4781 Encounter for orthopedic aftercare following surgical amputation: Secondary | ICD-10-CM | POA: Diagnosis not present

## 2017-09-27 DIAGNOSIS — I10 Essential (primary) hypertension: Secondary | ICD-10-CM | POA: Diagnosis not present

## 2017-09-27 DIAGNOSIS — I739 Peripheral vascular disease, unspecified: Secondary | ICD-10-CM | POA: Diagnosis not present

## 2017-09-30 ENCOUNTER — Other Ambulatory Visit (INDEPENDENT_AMBULATORY_CARE_PROVIDER_SITE_OTHER): Payer: Self-pay | Admitting: Vascular Surgery

## 2017-09-30 ENCOUNTER — Encounter (INDEPENDENT_AMBULATORY_CARE_PROVIDER_SITE_OTHER): Payer: Self-pay | Admitting: Vascular Surgery

## 2017-09-30 ENCOUNTER — Ambulatory Visit (INDEPENDENT_AMBULATORY_CARE_PROVIDER_SITE_OTHER): Payer: Medicare Other | Admitting: Vascular Surgery

## 2017-09-30 VITALS — BP 151/77 | HR 83 | Resp 16 | Ht 66.0 in | Wt 133.0 lb

## 2017-09-30 DIAGNOSIS — Z89511 Acquired absence of right leg below knee: Secondary | ICD-10-CM

## 2017-09-30 DIAGNOSIS — E785 Hyperlipidemia, unspecified: Secondary | ICD-10-CM

## 2017-09-30 DIAGNOSIS — F17200 Nicotine dependence, unspecified, uncomplicated: Secondary | ICD-10-CM

## 2017-09-30 MED ORDER — OXYCODONE-ACETAMINOPHEN 5-325 MG PO TABS: 1.0000 | ORAL_TABLET | Freq: Four times a day (QID) | ORAL | 0 refills | Status: DC | PRN

## 2017-09-30 NOTE — Progress Notes (Signed)
Subjective:    Patient ID: Jordan Mills, male    DOB: May 25, 1951, 66 y.o.   MRN: [id number] Chief Complaint  Patient presents with  . Follow-up    2 week follow up stitch removal   Patient presents for his first postoperative visit.  The patient is status post a right below the knee amputation on August 28, 2017.  He presents today to have his first set of staples removed.  The patient presents today without complaint.  The patient denies any issues with his stump or incision line.  The patient denies any fever, nausea vomiting.   Review of Systems  Constitutional: Negative.   HENT: Negative.   Eyes: Negative.   Respiratory: Negative.   Cardiovascular:       R BKA  Gastrointestinal: Negative.   Endocrine: Negative.   Genitourinary: Negative.   Musculoskeletal: Negative.   Skin: Negative.   Allergic/Immunologic: Negative.   Neurological: Negative.   Hematological: Negative.   Psychiatric/Behavioral: Negative.       Objective:   Physical Exam  Constitutional: He appears well-developed and well-nourished. No distress.  HENT:  Head: Normocephalic and atraumatic.  Eyes: Conjunctivae are normal. Pupils are equal, round, and reactive to light.  Neck: Normal range of motion.  Cardiovascular: Normal rate, regular rhythm, normal heart sounds and intact distal pulses.  Pulses:      Radial pulses are 2+ on the right side, and 2+ on the left side.  R BKA: Stump is healing well.  Minimal edema to the stump area.  Surrounding skin is healthy.  Staples are intact.  Pulmonary/Chest: Effort normal and breath sounds normal.  Musculoskeletal: Normal range of motion. He exhibits no edema.  Neurological: He is alert.  Skin: Skin is warm and dry. He is not diaphoretic.  Psychiatric: He has a normal mood and affect. His behavior is normal. Judgment and thought content normal.  Vitals reviewed.  BP (!) 151/77 (BP Location: Left Arm, Patient Position: Sitting)   Pulse 83   Resp 16    Ht 5\' 6"  (1.676 m)   Wt 133 lb (60.3 kg)   BMI 21.47 kg/m   Past Medical History:  Diagnosis Date  . Gout   . HLD (hyperlipidemia)   . Hypertension   . Peripheral vascular disease ([place])    Social History   Socioeconomic History  . Marital status: Married    Spouse name: Not on file  . Number of children: Not on file  . Years of education: Not on file  . Highest education level: Not on file  Social Needs  . Financial resource strain: Not on file  . Food insecurity - worry: Not on file  . Food insecurity - inability: Not on file  . Transportation needs - medical: Not on file  . Transportation needs - non-medical: Not on file  Occupational History  . Not on file  Tobacco Use  . Smoking status: Former Smoker    Packs/day: 0.20    Types: Cigarettes    Last attempt to quit: 06/13/2017    Years since quitting: 0.2  . Smokeless tobacco: Never Used  Substance and Sexual Activity  . Alcohol use: No  . Drug use: No  . Sexual activity: Not on file  Other Topics Concern  . Not on file  Social History Narrative  . Not on file   Past Surgical History:  Procedure Laterality Date  . AMPUTATION Right 08/28/2017   Procedure: AMPUTATION BELOW KNEE;  Surgeon: Algernon Huxley,  MD;  Location: [facility] ORS;  Service: Vascular;  Laterality: Right;  . AMPUTATION TOE Right 07/19/2017   Procedure: AMPUTATION TOE/Rt 4th MPJ;  Surgeon: Sharlotte Alamo, DPM;  Location: [facility] ORS;  Service: Podiatry;  Laterality: Right;  . LOWER EXTREMITY ANGIOGRAPHY Right 07/01/2017   Procedure: Lower Extremity Angiography;  Surgeon: Algernon Huxley, MD;  Location: [place] [facility];  Service: Cardiovascular;  Laterality: Right;  . LOWER EXTREMITY ANGIOGRAPHY Right 08/15/2017   Procedure: Lower Extremity Angiography;  Surgeon: Algernon Huxley, MD;  Location: [place] [facility];  Service: Cardiovascular;  Laterality: Right;  . LOWER EXTREMITY ANGIOGRAPHY Right 08/16/2017   Procedure: LOWER EXTREMITY ANGIOGRAPHY;  Surgeon: Algernon Huxley, MD;  Location: [place] [facility];  Service: Cardiovascular;  Laterality: Right;  . LOWER EXTREMITY INTERVENTION  08/15/2017   Procedure: LOWER EXTREMITY INTERVENTION;  Surgeon: Algernon Huxley, MD;  Location: [place] [facility];  Service: Cardiovascular;;  . TONSILLECTOMY     Family History  Problem Relation Age of Onset  . Hypertension Mother   . Hyperlipidemia Mother   . Diabetes Mother   . Stroke Mother   . Lung cancer Sister    No Known Allergies     Assessment & Plan:  Patient presents for his first postoperative visit.  The patient is status post a right below the knee amputation on August 28, 2017.  He presents today to have his first set of staples removed.  The patient presents today without complaint.  The patient denies any issues with his stump or incision line.  The patient denies any fever, nausea vomiting.  1. Hx of BKA, right ([place]) - New Patient presents for his first postoperative follow-up. The patient's postoperative course has been unremarkable Every other staple has been removed from the incision without complication The patient is to follow-up in 1 week to have the remaining staples removed  2. Hyperlipidemia, unspecified hyperlipidemia type - Stable Encouraged good control as its slows the progression of atherosclerotic disease  3. Tobacco use disorder - Stable We had a discussion for approximately 10 minutes regarding the absolute need for smoking cessation due to the deleterious nature of tobacco on the vascular system. We discussed the tobacco use would diminish patency of any intervention, and likely significantly worsen progressio of disease. We discussed multiple agents for quitting including replacement therapy or medications to reduce cravings such as Chantix. The patient voices their understanding of the importance of smoking cessation.  Current Outpatient Medications on File Prior to Visit  Medication Sig Dispense Refill  . amLODipine  (NORVASC) 5 MG tablet Take 5 mg by mouth 2 (two) times daily.  10  . aspirin EC 81 MG tablet Take by mouth.    Marland Kitchen atorvastatin (LIPITOR) 10 MG tablet Take 1 tablet (10 mg total) by mouth daily. 30 tablet 11  . clopidogrel (PLAVIX) 75 MG tablet Take 1 tablet (75 mg total) by mouth daily. 30 tablet 11  . docusate (COLACE) 50 MG/5ML liquid Take 5 mLs (50 mg total) daily by mouth. 100 mL 0  . ferrous sulfate 325 (65 FE) MG tablet Take 1 tablet (325 mg total) 2 (two) times daily with a meal by mouth. 60 tablet 3  . lisinopril-hydrochlorothiazide (PRINZIDE,ZESTORETIC) 20-12.5 MG tablet Take 2 tablets by mouth daily.  5  . metoprolol (LOPRESSOR) 50 MG tablet Take 50 mg by mouth 2 (two) times daily.  4  . multivitamin-iron-minerals-folic acid (CENTRUM) chewable tablet Chew 1 tablet by mouth daily.  No current facility-administered medications on file prior to visit.    There are no Patient Instructions on file for this visit. No Follow-up on file.  KIMBERLY A STEGMAYER, PA-C

## 2017-10-01 DIAGNOSIS — M109 Gout, unspecified: Secondary | ICD-10-CM | POA: Diagnosis not present

## 2017-10-01 DIAGNOSIS — I739 Peripheral vascular disease, unspecified: Secondary | ICD-10-CM | POA: Diagnosis not present

## 2017-10-01 DIAGNOSIS — Z4781 Encounter for orthopedic aftercare following surgical amputation: Secondary | ICD-10-CM | POA: Diagnosis not present

## 2017-10-01 DIAGNOSIS — Z89511 Acquired absence of right leg below knee: Secondary | ICD-10-CM | POA: Diagnosis not present

## 2017-10-01 DIAGNOSIS — E785 Hyperlipidemia, unspecified: Secondary | ICD-10-CM | POA: Diagnosis not present

## 2017-10-01 DIAGNOSIS — I10 Essential (primary) hypertension: Secondary | ICD-10-CM | POA: Diagnosis not present

## 2017-10-02 DIAGNOSIS — Z89511 Acquired absence of right leg below knee: Secondary | ICD-10-CM | POA: Diagnosis not present

## 2017-10-02 DIAGNOSIS — Z4781 Encounter for orthopedic aftercare following surgical amputation: Secondary | ICD-10-CM | POA: Diagnosis not present

## 2017-10-02 DIAGNOSIS — I739 Peripheral vascular disease, unspecified: Secondary | ICD-10-CM | POA: Diagnosis not present

## 2017-10-02 DIAGNOSIS — M109 Gout, unspecified: Secondary | ICD-10-CM | POA: Diagnosis not present

## 2017-10-02 DIAGNOSIS — E785 Hyperlipidemia, unspecified: Secondary | ICD-10-CM | POA: Diagnosis not present

## 2017-10-02 DIAGNOSIS — I10 Essential (primary) hypertension: Secondary | ICD-10-CM | POA: Diagnosis not present

## 2017-10-03 DIAGNOSIS — I739 Peripheral vascular disease, unspecified: Secondary | ICD-10-CM | POA: Diagnosis not present

## 2017-10-03 DIAGNOSIS — M109 Gout, unspecified: Secondary | ICD-10-CM | POA: Diagnosis not present

## 2017-10-03 DIAGNOSIS — Z4781 Encounter for orthopedic aftercare following surgical amputation: Secondary | ICD-10-CM | POA: Diagnosis not present

## 2017-10-03 DIAGNOSIS — I10 Essential (primary) hypertension: Secondary | ICD-10-CM | POA: Diagnosis not present

## 2017-10-03 DIAGNOSIS — E785 Hyperlipidemia, unspecified: Secondary | ICD-10-CM | POA: Diagnosis not present

## 2017-10-03 DIAGNOSIS — Z89511 Acquired absence of right leg below knee: Secondary | ICD-10-CM | POA: Diagnosis not present

## 2017-10-04 DIAGNOSIS — I739 Peripheral vascular disease, unspecified: Secondary | ICD-10-CM | POA: Diagnosis not present

## 2017-10-04 DIAGNOSIS — H6122 Impacted cerumen, left ear: Secondary | ICD-10-CM | POA: Diagnosis not present

## 2017-10-04 DIAGNOSIS — M109 Gout, unspecified: Secondary | ICD-10-CM | POA: Diagnosis not present

## 2017-10-04 DIAGNOSIS — E785 Hyperlipidemia, unspecified: Secondary | ICD-10-CM | POA: Diagnosis not present

## 2017-10-04 DIAGNOSIS — J449 Chronic obstructive pulmonary disease, unspecified: Secondary | ICD-10-CM | POA: Diagnosis not present

## 2017-10-04 DIAGNOSIS — I10 Essential (primary) hypertension: Secondary | ICD-10-CM | POA: Diagnosis not present

## 2017-10-04 DIAGNOSIS — Z4781 Encounter for orthopedic aftercare following surgical amputation: Secondary | ICD-10-CM | POA: Diagnosis not present

## 2017-10-04 DIAGNOSIS — N182 Chronic kidney disease, stage 2 (mild): Secondary | ICD-10-CM | POA: Diagnosis not present

## 2017-10-04 DIAGNOSIS — Z89511 Acquired absence of right leg below knee: Secondary | ICD-10-CM | POA: Diagnosis not present

## 2017-10-06 DIAGNOSIS — I10 Essential (primary) hypertension: Secondary | ICD-10-CM | POA: Diagnosis not present

## 2017-10-06 DIAGNOSIS — Z89511 Acquired absence of right leg below knee: Secondary | ICD-10-CM | POA: Diagnosis not present

## 2017-10-06 DIAGNOSIS — I739 Peripheral vascular disease, unspecified: Secondary | ICD-10-CM | POA: Diagnosis not present

## 2017-10-06 DIAGNOSIS — Z4781 Encounter for orthopedic aftercare following surgical amputation: Secondary | ICD-10-CM | POA: Diagnosis not present

## 2017-10-06 DIAGNOSIS — E785 Hyperlipidemia, unspecified: Secondary | ICD-10-CM | POA: Diagnosis not present

## 2017-10-06 DIAGNOSIS — M109 Gout, unspecified: Secondary | ICD-10-CM | POA: Diagnosis not present

## 2017-10-07 DIAGNOSIS — Z4781 Encounter for orthopedic aftercare following surgical amputation: Secondary | ICD-10-CM | POA: Diagnosis not present

## 2017-10-07 DIAGNOSIS — I10 Essential (primary) hypertension: Secondary | ICD-10-CM | POA: Diagnosis not present

## 2017-10-07 DIAGNOSIS — E785 Hyperlipidemia, unspecified: Secondary | ICD-10-CM | POA: Diagnosis not present

## 2017-10-07 DIAGNOSIS — M109 Gout, unspecified: Secondary | ICD-10-CM | POA: Diagnosis not present

## 2017-10-07 DIAGNOSIS — Z89511 Acquired absence of right leg below knee: Secondary | ICD-10-CM | POA: Diagnosis not present

## 2017-10-07 DIAGNOSIS — I739 Peripheral vascular disease, unspecified: Secondary | ICD-10-CM | POA: Diagnosis not present

## 2017-10-09 DIAGNOSIS — M109 Gout, unspecified: Secondary | ICD-10-CM | POA: Diagnosis not present

## 2017-10-09 DIAGNOSIS — I739 Peripheral vascular disease, unspecified: Secondary | ICD-10-CM | POA: Diagnosis not present

## 2017-10-09 DIAGNOSIS — I10 Essential (primary) hypertension: Secondary | ICD-10-CM | POA: Diagnosis not present

## 2017-10-09 DIAGNOSIS — E785 Hyperlipidemia, unspecified: Secondary | ICD-10-CM | POA: Diagnosis not present

## 2017-10-09 DIAGNOSIS — Z89511 Acquired absence of right leg below knee: Secondary | ICD-10-CM | POA: Diagnosis not present

## 2017-10-09 DIAGNOSIS — Z4781 Encounter for orthopedic aftercare following surgical amputation: Secondary | ICD-10-CM | POA: Diagnosis not present

## 2017-10-10 ENCOUNTER — Encounter (INDEPENDENT_AMBULATORY_CARE_PROVIDER_SITE_OTHER): Payer: Self-pay | Admitting: Vascular Surgery

## 2017-10-10 ENCOUNTER — Ambulatory Visit (INDEPENDENT_AMBULATORY_CARE_PROVIDER_SITE_OTHER): Payer: Medicare Other | Admitting: Vascular Surgery

## 2017-10-10 VITALS — BP 131/72 | HR 80 | Resp 16

## 2017-10-10 DIAGNOSIS — E785 Hyperlipidemia, unspecified: Secondary | ICD-10-CM

## 2017-10-10 DIAGNOSIS — F17200 Nicotine dependence, unspecified, uncomplicated: Secondary | ICD-10-CM

## 2017-10-10 DIAGNOSIS — Z89511 Acquired absence of right leg below knee: Secondary | ICD-10-CM

## 2017-10-10 NOTE — Progress Notes (Signed)
Subjective:    Patient ID: Jordan Mills, male    DOB: 08/23/1951, 66 y.o.   MRN: [id number] Chief Complaint  Patient presents with  . Suture / Staple Removal   Patient presents for his third post procedure follow-up.  The patient presents to have his remaining staples removed from his right below the knee amputation site.  The patient's postoperative course has remained uneventful.  The patient denies any increased pain or drainage from the amputation site.  The patient denies any dehiscence to the incision.  Denies any fever, nausea or vomiting since last visit.   Review of Systems  Constitutional: Negative.   HENT: Negative.   Eyes: Negative.   Respiratory: Negative.   Cardiovascular: Negative.   Gastrointestinal: Negative.   Endocrine: Negative.   Genitourinary: Negative.   Musculoskeletal: Negative.   Skin: Negative.   Allergic/Immunologic: Negative.   Neurological: Negative.   Hematological: Negative.   Psychiatric/Behavioral: Negative.       Objective:   Physical Exam  Constitutional: He is oriented to person, place, and time. He appears well-developed and well-nourished.  HENT:  Head: Normocephalic and atraumatic.  Eyes: Conjunctivae are normal. Pupils are equal, round, and reactive to light.  Neck: Normal range of motion.  Cardiovascular: Normal rate, regular rhythm, normal heart sounds and intact distal pulses.  Pulses:      Radial pulses are 2+ on the right side, and 2+ on the left side.  Right below the knee stump: Incision intact.  The remaining staples removed without complication Steri-Strips applied.  The stump is healing well.  Mild edema to the stump.  Pulmonary/Chest: Effort normal and breath sounds normal.  Musculoskeletal: Normal range of motion. He exhibits no edema.  Neurological: He is alert and oriented to person, place, and time.  Skin: Skin is warm and dry.  Psychiatric: He has a normal mood and affect. His behavior is normal. Judgment and  thought content normal.  Vitals reviewed.  BP 131/72 (BP Location: Right Arm)   Pulse 80   Resp 16   Past Medical History:  Diagnosis Date  . Gout   . HLD (hyperlipidemia)   . Hypertension   . Peripheral vascular disease ([place])    Social History   Socioeconomic History  . Marital status: Married    Spouse name: Not on file  . Number of children: Not on file  . Years of education: Not on file  . Highest education level: Not on file  Social Needs  . Financial resource strain: Not on file  . Food insecurity - worry: Not on file  . Food insecurity - inability: Not on file  . Transportation needs - medical: Not on file  . Transportation needs - non-medical: Not on file  Occupational History  . Not on file  Tobacco Use  . Smoking status: Former Smoker    Packs/day: 0.20    Types: Cigarettes    Last attempt to quit: 06/13/2017    Years since quitting: 0.3  . Smokeless tobacco: Never Used  Substance and Sexual Activity  . Alcohol use: No  . Drug use: No  . Sexual activity: Not on file  Other Topics Concern  . Not on file  Social History Narrative  . Not on file   Past Surgical History:  Procedure Laterality Date  . AMPUTATION Right 08/28/2017   Procedure: AMPUTATION BELOW KNEE;  Surgeon: Algernon Huxley, MD;  Location: [facility] ORS;  Service: Vascular;  Laterality: Right;  . AMPUTATION TOE Right  07/19/2017   Procedure: AMPUTATION TOE/Rt 4th MPJ;  Surgeon: Sharlotte Alamo, DPM;  Location: [facility] ORS;  Service: Podiatry;  Laterality: Right;  . LOWER EXTREMITY ANGIOGRAPHY Right 07/01/2017   Procedure: Lower Extremity Angiography;  Surgeon: Algernon Huxley, MD;  Location: [place] [facility];  Service: Cardiovascular;  Laterality: Right;  . LOWER EXTREMITY ANGIOGRAPHY Right 08/15/2017   Procedure: Lower Extremity Angiography;  Surgeon: Algernon Huxley, MD;  Location: [place] [facility];  Service: Cardiovascular;  Laterality: Right;  . LOWER EXTREMITY ANGIOGRAPHY Right 08/16/2017   Procedure:  LOWER EXTREMITY ANGIOGRAPHY;  Surgeon: Algernon Huxley, MD;  Location: [place] [facility];  Service: Cardiovascular;  Laterality: Right;  . LOWER EXTREMITY INTERVENTION  08/15/2017   Procedure: LOWER EXTREMITY INTERVENTION;  Surgeon: Algernon Huxley, MD;  Location: [place] [facility];  Service: Cardiovascular;;  . TONSILLECTOMY     Family History  Problem Relation Age of Onset  . Hypertension Mother   . Hyperlipidemia Mother   . Diabetes Mother   . Stroke Mother   . Lung cancer Sister    No Known Allergies     Assessment & Plan:  Patient presents for his third post procedure follow-up.  The patient presents to have his remaining staples removed from his right below the knee amputation site.  The patient's postoperative course has remained uneventful.  The patient denies any increased pain or drainage from the amputation site.  The patient denies any dehiscence to the incision.  Denies any fever, nausea or vomiting since last visit.  1. Hx of BKA, right ([place]) - Stable The remaining staples were removed without complication Steri-Strips were applied Patient's information was sent to [employer] for shrinker stock and prosthetic evaluation and training Patient is to follow-up in 1 month for stump check The patient is to call the office sooner if he notes any impediment to his wound healing. Patient and his wife expressed their understanding  2. Hyperlipidemia, unspecified hyperlipidemia type - Stable Encouraged good control as its slows the progression of atherosclerotic disease  3. Tobacco use disorder - Stable We had a discussion for approximately 5 minutes regarding the absolute need for smoking cessation due to the deleterious nature of tobacco on the vascular system. We discussed the tobacco use would diminish patency of any intervention, and likely significantly worsen progressio of disease. We discussed multiple agents for quitting including replacement therapy or medications to reduce  cravings such as Chantix. The patient voices their understanding of the importance of smoking cessation.  Current Outpatient Medications on File Prior to Visit  Medication Sig Dispense Refill  . amLODipine (NORVASC) 5 MG tablet Take 5 mg by mouth 2 (two) times daily.  10  . aspirin EC 81 MG tablet Take by mouth.    Marland Kitchen atorvastatin (LIPITOR) 10 MG tablet Take 1 tablet (10 mg total) by mouth daily. 30 tablet 11  . clopidogrel (PLAVIX) 75 MG tablet Take 1 tablet (75 mg total) by mouth daily. 30 tablet 11  . docusate (COLACE) 50 MG/5ML liquid Take 5 mLs (50 mg total) daily by mouth. 100 mL 0  . ferrous sulfate 325 (65 FE) MG tablet Take 1 tablet (325 mg total) 2 (two) times daily with a meal by mouth. 60 tablet 3  . lisinopril-hydrochlorothiazide (PRINZIDE,ZESTORETIC) 20-12.5 MG tablet Take 2 tablets by mouth daily.  5  . metoprolol (LOPRESSOR) 50 MG tablet Take 50 mg by mouth 2 (two) times daily.  4  . multivitamin-iron-minerals-folic acid (CENTRUM) chewable tablet Chew  1 tablet by mouth daily.    Marland Kitchen oxyCODONE-acetaminophen (PERCOCET/ROXICET) 5-325 MG tablet Take 1-2 tablets by mouth every 6 (six) hours as needed for moderate pain or severe pain. 30 tablet 0   No current facility-administered medications on file prior to visit.    There are no Patient Instructions on file for this visit. No Follow-up on file.  Lissie Hinesley A Jasn Xia, PA-C

## 2017-10-11 DIAGNOSIS — Z89511 Acquired absence of right leg below knee: Secondary | ICD-10-CM | POA: Diagnosis not present

## 2017-10-11 DIAGNOSIS — I739 Peripheral vascular disease, unspecified: Secondary | ICD-10-CM | POA: Diagnosis not present

## 2017-10-11 DIAGNOSIS — E785 Hyperlipidemia, unspecified: Secondary | ICD-10-CM | POA: Diagnosis not present

## 2017-10-11 DIAGNOSIS — I10 Essential (primary) hypertension: Secondary | ICD-10-CM | POA: Diagnosis not present

## 2017-10-11 DIAGNOSIS — M109 Gout, unspecified: Secondary | ICD-10-CM | POA: Diagnosis not present

## 2017-10-11 DIAGNOSIS — H6692 Otitis media, unspecified, left ear: Secondary | ICD-10-CM | POA: Diagnosis not present

## 2017-10-11 DIAGNOSIS — Z4781 Encounter for orthopedic aftercare following surgical amputation: Secondary | ICD-10-CM | POA: Diagnosis not present

## 2017-10-14 DIAGNOSIS — I739 Peripheral vascular disease, unspecified: Secondary | ICD-10-CM | POA: Diagnosis not present

## 2017-10-14 DIAGNOSIS — Z4781 Encounter for orthopedic aftercare following surgical amputation: Secondary | ICD-10-CM | POA: Diagnosis not present

## 2017-10-14 DIAGNOSIS — E785 Hyperlipidemia, unspecified: Secondary | ICD-10-CM | POA: Diagnosis not present

## 2017-10-14 DIAGNOSIS — M109 Gout, unspecified: Secondary | ICD-10-CM | POA: Diagnosis not present

## 2017-10-14 DIAGNOSIS — Z89511 Acquired absence of right leg below knee: Secondary | ICD-10-CM | POA: Diagnosis not present

## 2017-10-14 DIAGNOSIS — I10 Essential (primary) hypertension: Secondary | ICD-10-CM | POA: Diagnosis not present

## 2017-10-15 DIAGNOSIS — Z4781 Encounter for orthopedic aftercare following surgical amputation: Secondary | ICD-10-CM | POA: Diagnosis not present

## 2017-10-15 DIAGNOSIS — I10 Essential (primary) hypertension: Secondary | ICD-10-CM | POA: Diagnosis not present

## 2017-10-15 DIAGNOSIS — I739 Peripheral vascular disease, unspecified: Secondary | ICD-10-CM | POA: Diagnosis not present

## 2017-10-15 DIAGNOSIS — E785 Hyperlipidemia, unspecified: Secondary | ICD-10-CM | POA: Diagnosis not present

## 2017-10-15 DIAGNOSIS — Z89511 Acquired absence of right leg below knee: Secondary | ICD-10-CM | POA: Diagnosis not present

## 2017-10-15 DIAGNOSIS — M109 Gout, unspecified: Secondary | ICD-10-CM | POA: Diagnosis not present

## 2017-10-16 DIAGNOSIS — I10 Essential (primary) hypertension: Secondary | ICD-10-CM | POA: Diagnosis not present

## 2017-10-16 DIAGNOSIS — E785 Hyperlipidemia, unspecified: Secondary | ICD-10-CM | POA: Diagnosis not present

## 2017-10-16 DIAGNOSIS — Z4781 Encounter for orthopedic aftercare following surgical amputation: Secondary | ICD-10-CM | POA: Diagnosis not present

## 2017-10-16 DIAGNOSIS — M109 Gout, unspecified: Secondary | ICD-10-CM | POA: Diagnosis not present

## 2017-10-16 DIAGNOSIS — Z89511 Acquired absence of right leg below knee: Secondary | ICD-10-CM | POA: Diagnosis not present

## 2017-10-16 DIAGNOSIS — I739 Peripheral vascular disease, unspecified: Secondary | ICD-10-CM | POA: Diagnosis not present

## 2017-10-18 ENCOUNTER — Other Ambulatory Visit (INDEPENDENT_AMBULATORY_CARE_PROVIDER_SITE_OTHER): Payer: Self-pay | Admitting: Vascular Surgery

## 2017-10-18 ENCOUNTER — Telehealth (INDEPENDENT_AMBULATORY_CARE_PROVIDER_SITE_OTHER): Payer: Self-pay

## 2017-10-18 DIAGNOSIS — M109 Gout, unspecified: Secondary | ICD-10-CM | POA: Diagnosis not present

## 2017-10-18 DIAGNOSIS — I739 Peripheral vascular disease, unspecified: Secondary | ICD-10-CM

## 2017-10-18 DIAGNOSIS — Z89511 Acquired absence of right leg below knee: Secondary | ICD-10-CM | POA: Diagnosis not present

## 2017-10-18 DIAGNOSIS — I10 Essential (primary) hypertension: Secondary | ICD-10-CM | POA: Diagnosis not present

## 2017-10-18 DIAGNOSIS — E785 Hyperlipidemia, unspecified: Secondary | ICD-10-CM | POA: Diagnosis not present

## 2017-10-18 DIAGNOSIS — Z4781 Encounter for orthopedic aftercare following surgical amputation: Secondary | ICD-10-CM | POA: Diagnosis not present

## 2017-10-18 NOTE — Telephone Encounter (Signed)
Aldrin from [facility] called wanting a verbal order to continue homehealth PT twice weekly for 3 more weeks. Verbal has been given.

## 2017-10-22 ENCOUNTER — Ambulatory Visit (INDEPENDENT_AMBULATORY_CARE_PROVIDER_SITE_OTHER): Payer: Medicare Other | Admitting: Vascular Surgery

## 2017-10-22 ENCOUNTER — Ambulatory Visit (INDEPENDENT_AMBULATORY_CARE_PROVIDER_SITE_OTHER): Payer: Medicare Other

## 2017-10-22 ENCOUNTER — Encounter (INDEPENDENT_AMBULATORY_CARE_PROVIDER_SITE_OTHER): Payer: Self-pay | Admitting: Vascular Surgery

## 2017-10-22 VITALS — BP 196/87 | HR 76 | Resp 18

## 2017-10-22 DIAGNOSIS — I739 Peripheral vascular disease, unspecified: Secondary | ICD-10-CM

## 2017-10-22 DIAGNOSIS — Z89511 Acquired absence of right leg below knee: Secondary | ICD-10-CM

## 2017-10-22 DIAGNOSIS — E785 Hyperlipidemia, unspecified: Secondary | ICD-10-CM

## 2017-10-22 NOTE — Assessment & Plan Note (Signed)
His left ABI today is stable at 0.86 with moderate stenosis seen in the SFA as well as tibial disease.  No intervention currently necessary.  Plan to recheck in 6 months.

## 2017-10-22 NOTE — Assessment & Plan Note (Signed)
Nearly healed at this point.  Start the prosthesis evaluation process.

## 2017-10-22 NOTE — Assessment & Plan Note (Signed)
lipid control important in reducing the progression of atherosclerotic disease. Continue statin therapy  

## 2017-10-22 NOTE — Patient Instructions (Signed)
Enfermedad vascular perifrica (Peripheral Vascular Disease) La enfermedad vascular perifrica (EVP) es una enfermedad de los vasos sanguneos que no forman parte del corazn y Nurse, mental health. El trmino simple que se utiliza para la EVP es "mala circulacin". En la [employer], la EVP estrecha los vasos sanguneos que transportan la sangre del corazn al resto del organismo. Esto puede Theme park manager suministro de sangre a los brazos, las piernas y los rganos internos, como el estmago o los riones. No obstante, en la [employer] de los [employer] parte inferior de las piernas y los pies de Geologist, engineering. Hay dos tipos de EVP.  EVP orgnica. Este es el tipo ms comn. Su causa es el dao en la estructura de los vasos sanguneos.  EVP funcional. Es causada por enfermedades que provocan la contraccin y Pharmacist, hospital (espasmo) de los vasos sanguneos. Si no se recibe tratamiento, la EVP tiende a Copy con el transcurso del [place]. La EVP tambin puede provocar isquemia aguda en las extremidades. Esto se produce cuando un brazo o una extremidad, de repente, no puede recibir suficiente cantidad de [place]. Esto es Engineering geologist. [place] los medicamentos solamente como se lo haya indicado el mdico.  No consuma ningn producto que contenga tabaco, lo que incluye cigarrillos, tabaco de Higher education careers adviser o Psychologist, sport and exercise. Si necesita ayuda para dejar de fumar, consulte al mdico.  Si tiene sobrepeso, baje de [place] y [place] un peso saludable como se lo haya indicado el mdico.  Siga una dieta con bajo contenido de grasa y colesterol. Si necesita ayuda, consulte al mdico.  Haga ejercicios regularmente. [place] al mdico que le sugiera algunas actividades que sean adecuadas para usted.  Cuide bien sus pies. ? Use calzado cmodo que calcen bien. ? Revise sus pies con frecuencia para ver si hay cortes o llagas.  SOLICITE AYUDA SI:  Tiene calambres en las  piernas cuando camina.  Tioga piernas al descansar.  Siente fro en una pierna o un pie.  Se producen cambios en la piel.  No puede tener una ereccin (disfuncin erctil).  Tiene cortes o llagas en los pies que no se curan.  SOLICITE AYUDA DE INMEDIATO SI:  Siente que la pierna o el brazo est fro o de [employer].  Tiene enrojecimiento, calor, inflamacin, dolor o adormecimiento en los brazos o las piernas.  Tiene dolor en el pecho o dificultad para respirar.  Siente en forma repentina debilidad en la cara, los brazos o las piernas.  Est muy confundido o no puede hablar.  Siente un dolor de cabeza muy intenso de [place] repentina.  Pierde la visin en forma repentina.  Esta informacin no tiene Marine scientist el consejo del mdico. Asegrese de hacerle al mdico cualquier pregunta que tenga. Document Released: 11/03/2010 Document Revised: 10/22/2014 Document Reviewed: 03/11/2014 Elsevier Interactive Patient Education  2017 [employer].

## 2017-10-22 NOTE — Progress Notes (Signed)
Patient ID: Jordan Mills, male   DOB: [birth year], 67 y.o.   MRN: [id number]  Chief Complaint  Patient presents with  . Follow-up    2-82mo abi bil le art    HPI Jordan Mills is a 67 y.o. male.  Patient returns in follow-up of his peripheral arterial disease.  About 2 months ago, he underwent a right below-knee amputation which is healing fairly well.  He has a small superficial ulceration on the lateral aspect of the wound but the wound is otherwise healed.  He still complains of a fair bit of stump pain and asks for more pain medication today.  This appointment was already scheduled to reassess for PAD several months ago.  His left ABI today is stable at 0.86 with moderate stenosis seen in the SFA as well as tibial disease.   Past Medical History:  Diagnosis Date  . Gout   . HLD (hyperlipidemia)   . Hypertension   . Peripheral vascular disease [facility])     Past Surgical History:  Procedure Laterality Date  . AMPUTATION Right 08/28/2017   Procedure: AMPUTATION BELOW KNEE;  Surgeon: Algernon Huxley, MD;  Location: [facility] ORS;  Service: Vascular;  Laterality: Right;  . AMPUTATION TOE Right 07/19/2017   Procedure: AMPUTATION TOE/Rt 4th MPJ;  Surgeon: Sharlotte Alamo, DPM;  Location: [facility] ORS;  Service: Podiatry;  Laterality: Right;  . LOWER EXTREMITY ANGIOGRAPHY Right 07/01/2017   Procedure: Lower Extremity Angiography;  Surgeon: Algernon Huxley, MD;  Location: [place] [facility];  Service: Cardiovascular;  Laterality: Right;  . LOWER EXTREMITY ANGIOGRAPHY Right 08/15/2017   Procedure: Lower Extremity Angiography;  Surgeon: Algernon Huxley, MD;  Location: [place] [facility];  Service: Cardiovascular;  Laterality: Right;  . LOWER EXTREMITY ANGIOGRAPHY Right 08/16/2017   Procedure: LOWER EXTREMITY ANGIOGRAPHY;  Surgeon: Algernon Huxley, MD;  Location: [place] [facility];  Service: Cardiovascular;  Laterality: Right;  . LOWER EXTREMITY INTERVENTION  08/15/2017   Procedure: LOWER EXTREMITY  INTERVENTION;  Surgeon: Algernon Huxley, MD;  Location: [place] [facility];  Service: Cardiovascular;;  . TONSILLECTOMY        No Known Allergies  Current Outpatient Medications  Medication Sig Dispense Refill  . amLODipine (NORVASC) 5 MG tablet Take 5 mg by mouth 2 (two) times daily.  10  . aspirin EC 81 MG tablet Take by mouth.    Marland Kitchen atorvastatin (LIPITOR) 10 MG tablet Take 1 tablet (10 mg total) by mouth daily. 30 tablet 11  . clopidogrel (PLAVIX) 75 MG tablet Take 1 tablet (75 mg total) by mouth daily. 30 tablet 11  . docusate (COLACE) 50 MG/5ML liquid Take 5 mLs (50 mg total) daily by mouth. 100 mL 0  . ferrous sulfate 325 (65 FE) MG tablet Take 1 tablet (325 mg total) 2 (two) times daily with a meal by mouth. 60 tablet 3  . lisinopril-hydrochlorothiazide (PRINZIDE,ZESTORETIC) 20-12.5 MG tablet Take 2 tablets by mouth daily.  5  . metoprolol (LOPRESSOR) 50 MG tablet Take 50 mg by mouth 2 (two) times daily.  4  . multivitamin-iron-minerals-folic acid (CENTRUM) chewable tablet Chew 1 tablet by mouth daily.    Marland Kitchen oxyCODONE-acetaminophen (PERCOCET/ROXICET) 5-325 MG tablet Take 1-2 tablets by mouth every 6 (six) hours as needed for moderate pain or severe pain. 30 tablet 0   No current facility-administered medications for this visit.         Physical Exam BP (!) 196/87 (BP Location: Right Arm)  Pulse 76   Resp 18  Gen:  WD/WN, NAD Skin: incision C/D/I with a superficial opening on the lateral aspect of the wound which is clean and without infection.     Assessment/Plan:  Hx of BKA, right ([place]) Nearly healed at this point.  Start the prosthesis evaluation process.  HLD (hyperlipidemia) lipid control important in reducing the progression of atherosclerotic disease. Continue statin therapy   Peripheral vascular disease ([place]) His left ABI today is stable at 0.86 with moderate stenosis seen in the SFA as well as tibial disease.  No intervention currently necessary.  Plan to  recheck in 6 months.      [name] 10/22/2017, 11:33 AM   This note was created with Dragon medical transcription system.  Any errors from dictation are unintentional.

## 2017-10-23 DIAGNOSIS — Z4781 Encounter for orthopedic aftercare following surgical amputation: Secondary | ICD-10-CM | POA: Diagnosis not present

## 2017-10-23 DIAGNOSIS — Z89511 Acquired absence of right leg below knee: Secondary | ICD-10-CM | POA: Diagnosis not present

## 2017-10-23 DIAGNOSIS — E785 Hyperlipidemia, unspecified: Secondary | ICD-10-CM | POA: Diagnosis not present

## 2017-10-23 DIAGNOSIS — I10 Essential (primary) hypertension: Secondary | ICD-10-CM | POA: Diagnosis not present

## 2017-10-23 DIAGNOSIS — I739 Peripheral vascular disease, unspecified: Secondary | ICD-10-CM | POA: Diagnosis not present

## 2017-10-23 DIAGNOSIS — M109 Gout, unspecified: Secondary | ICD-10-CM | POA: Diagnosis not present

## 2017-10-25 DIAGNOSIS — I739 Peripheral vascular disease, unspecified: Secondary | ICD-10-CM | POA: Diagnosis not present

## 2017-10-25 DIAGNOSIS — M109 Gout, unspecified: Secondary | ICD-10-CM | POA: Diagnosis not present

## 2017-10-25 DIAGNOSIS — Z4781 Encounter for orthopedic aftercare following surgical amputation: Secondary | ICD-10-CM | POA: Diagnosis not present

## 2017-10-25 DIAGNOSIS — Z89511 Acquired absence of right leg below knee: Secondary | ICD-10-CM | POA: Diagnosis not present

## 2017-10-25 DIAGNOSIS — E785 Hyperlipidemia, unspecified: Secondary | ICD-10-CM | POA: Diagnosis not present

## 2017-10-25 DIAGNOSIS — I10 Essential (primary) hypertension: Secondary | ICD-10-CM | POA: Diagnosis not present

## 2017-10-28 DIAGNOSIS — Z4781 Encounter for orthopedic aftercare following surgical amputation: Secondary | ICD-10-CM | POA: Diagnosis not present

## 2017-10-28 DIAGNOSIS — E785 Hyperlipidemia, unspecified: Secondary | ICD-10-CM | POA: Diagnosis not present

## 2017-10-28 DIAGNOSIS — Z89511 Acquired absence of right leg below knee: Secondary | ICD-10-CM | POA: Diagnosis not present

## 2017-10-28 DIAGNOSIS — M109 Gout, unspecified: Secondary | ICD-10-CM | POA: Diagnosis not present

## 2017-10-28 DIAGNOSIS — I10 Essential (primary) hypertension: Secondary | ICD-10-CM | POA: Diagnosis not present

## 2017-10-28 DIAGNOSIS — I739 Peripheral vascular disease, unspecified: Secondary | ICD-10-CM | POA: Diagnosis not present

## 2017-10-30 DIAGNOSIS — Z89511 Acquired absence of right leg below knee: Secondary | ICD-10-CM | POA: Diagnosis not present

## 2017-10-30 DIAGNOSIS — I10 Essential (primary) hypertension: Secondary | ICD-10-CM | POA: Diagnosis not present

## 2017-10-30 DIAGNOSIS — M109 Gout, unspecified: Secondary | ICD-10-CM | POA: Diagnosis not present

## 2017-10-30 DIAGNOSIS — I739 Peripheral vascular disease, unspecified: Secondary | ICD-10-CM | POA: Diagnosis not present

## 2017-10-30 DIAGNOSIS — Z4781 Encounter for orthopedic aftercare following surgical amputation: Secondary | ICD-10-CM | POA: Diagnosis not present

## 2017-10-30 DIAGNOSIS — E785 Hyperlipidemia, unspecified: Secondary | ICD-10-CM | POA: Diagnosis not present

## 2017-11-02 DIAGNOSIS — I10 Essential (primary) hypertension: Secondary | ICD-10-CM | POA: Diagnosis not present

## 2017-11-02 DIAGNOSIS — I739 Peripheral vascular disease, unspecified: Secondary | ICD-10-CM | POA: Diagnosis not present

## 2017-11-02 DIAGNOSIS — Z7982 Long term (current) use of aspirin: Secondary | ICD-10-CM | POA: Diagnosis not present

## 2017-11-02 DIAGNOSIS — Z9181 History of falling: Secondary | ICD-10-CM | POA: Diagnosis not present

## 2017-11-02 DIAGNOSIS — Z87891 Personal history of nicotine dependence: Secondary | ICD-10-CM | POA: Diagnosis not present

## 2017-11-02 DIAGNOSIS — E785 Hyperlipidemia, unspecified: Secondary | ICD-10-CM | POA: Diagnosis not present

## 2017-11-02 DIAGNOSIS — Z9981 Dependence on supplemental oxygen: Secondary | ICD-10-CM | POA: Diagnosis not present

## 2017-11-02 DIAGNOSIS — M109 Gout, unspecified: Secondary | ICD-10-CM | POA: Diagnosis not present

## 2017-11-02 DIAGNOSIS — Z4781 Encounter for orthopedic aftercare following surgical amputation: Secondary | ICD-10-CM | POA: Diagnosis not present

## 2017-11-02 DIAGNOSIS — Z89511 Acquired absence of right leg below knee: Secondary | ICD-10-CM | POA: Diagnosis not present

## 2017-11-04 DIAGNOSIS — I10 Essential (primary) hypertension: Secondary | ICD-10-CM | POA: Diagnosis not present

## 2017-11-04 DIAGNOSIS — Z4781 Encounter for orthopedic aftercare following surgical amputation: Secondary | ICD-10-CM | POA: Diagnosis not present

## 2017-11-04 DIAGNOSIS — M109 Gout, unspecified: Secondary | ICD-10-CM | POA: Diagnosis not present

## 2017-11-04 DIAGNOSIS — Z89511 Acquired absence of right leg below knee: Secondary | ICD-10-CM | POA: Diagnosis not present

## 2017-11-04 DIAGNOSIS — E785 Hyperlipidemia, unspecified: Secondary | ICD-10-CM | POA: Diagnosis not present

## 2017-11-04 DIAGNOSIS — I739 Peripheral vascular disease, unspecified: Secondary | ICD-10-CM | POA: Diagnosis not present

## 2017-11-05 DIAGNOSIS — M109 Gout, unspecified: Secondary | ICD-10-CM | POA: Diagnosis not present

## 2017-11-05 DIAGNOSIS — Z4781 Encounter for orthopedic aftercare following surgical amputation: Secondary | ICD-10-CM | POA: Diagnosis not present

## 2017-11-05 DIAGNOSIS — Z89511 Acquired absence of right leg below knee: Secondary | ICD-10-CM | POA: Diagnosis not present

## 2017-11-05 DIAGNOSIS — I10 Essential (primary) hypertension: Secondary | ICD-10-CM | POA: Diagnosis not present

## 2017-11-05 DIAGNOSIS — I739 Peripheral vascular disease, unspecified: Secondary | ICD-10-CM | POA: Diagnosis not present

## 2017-11-05 DIAGNOSIS — E785 Hyperlipidemia, unspecified: Secondary | ICD-10-CM | POA: Diagnosis not present

## 2017-11-06 DIAGNOSIS — E785 Hyperlipidemia, unspecified: Secondary | ICD-10-CM | POA: Diagnosis not present

## 2017-11-06 DIAGNOSIS — Z4781 Encounter for orthopedic aftercare following surgical amputation: Secondary | ICD-10-CM | POA: Diagnosis not present

## 2017-11-06 DIAGNOSIS — I10 Essential (primary) hypertension: Secondary | ICD-10-CM | POA: Diagnosis not present

## 2017-11-06 DIAGNOSIS — M109 Gout, unspecified: Secondary | ICD-10-CM | POA: Diagnosis not present

## 2017-11-06 DIAGNOSIS — I739 Peripheral vascular disease, unspecified: Secondary | ICD-10-CM | POA: Diagnosis not present

## 2017-11-06 DIAGNOSIS — Z89511 Acquired absence of right leg below knee: Secondary | ICD-10-CM | POA: Diagnosis not present

## 2017-11-08 DIAGNOSIS — I10 Essential (primary) hypertension: Secondary | ICD-10-CM | POA: Diagnosis not present

## 2017-11-08 DIAGNOSIS — Z4781 Encounter for orthopedic aftercare following surgical amputation: Secondary | ICD-10-CM | POA: Diagnosis not present

## 2017-11-08 DIAGNOSIS — M109 Gout, unspecified: Secondary | ICD-10-CM | POA: Diagnosis not present

## 2017-11-08 DIAGNOSIS — Z89511 Acquired absence of right leg below knee: Secondary | ICD-10-CM | POA: Diagnosis not present

## 2017-11-08 DIAGNOSIS — E785 Hyperlipidemia, unspecified: Secondary | ICD-10-CM | POA: Diagnosis not present

## 2017-11-08 DIAGNOSIS — I739 Peripheral vascular disease, unspecified: Secondary | ICD-10-CM | POA: Diagnosis not present

## 2017-11-11 DIAGNOSIS — M109 Gout, unspecified: Secondary | ICD-10-CM | POA: Diagnosis not present

## 2017-11-11 DIAGNOSIS — E785 Hyperlipidemia, unspecified: Secondary | ICD-10-CM | POA: Diagnosis not present

## 2017-11-11 DIAGNOSIS — Z4781 Encounter for orthopedic aftercare following surgical amputation: Secondary | ICD-10-CM | POA: Diagnosis not present

## 2017-11-11 DIAGNOSIS — I10 Essential (primary) hypertension: Secondary | ICD-10-CM | POA: Diagnosis not present

## 2017-11-11 DIAGNOSIS — I739 Peripheral vascular disease, unspecified: Secondary | ICD-10-CM | POA: Diagnosis not present

## 2017-11-11 DIAGNOSIS — Z89511 Acquired absence of right leg below knee: Secondary | ICD-10-CM | POA: Diagnosis not present

## 2017-11-12 ENCOUNTER — Ambulatory Visit (INDEPENDENT_AMBULATORY_CARE_PROVIDER_SITE_OTHER): Payer: Medicare Other | Admitting: Vascular Surgery

## 2017-11-12 ENCOUNTER — Encounter (INDEPENDENT_AMBULATORY_CARE_PROVIDER_SITE_OTHER): Payer: Self-pay | Admitting: Vascular Surgery

## 2017-11-12 VITALS — BP 143/78 | HR 64 | Resp 17 | Ht 66.0 in | Wt 130.0 lb

## 2017-11-12 DIAGNOSIS — F17200 Nicotine dependence, unspecified, uncomplicated: Secondary | ICD-10-CM

## 2017-11-12 DIAGNOSIS — Z89511 Acquired absence of right leg below knee: Secondary | ICD-10-CM

## 2017-11-12 DIAGNOSIS — E785 Hyperlipidemia, unspecified: Secondary | ICD-10-CM

## 2017-11-12 MED ORDER — SILVER SULFADIAZINE 1 % EX CREA: 1.0000 "application " | TOPICAL_CREAM | Freq: Every day | CUTANEOUS | 1 refills | Status: DC

## 2017-11-12 MED ORDER — GABAPENTIN 300 MG PO CAPS: 300.0000 mg | ORAL_CAPSULE | Freq: Two times a day (BID) | ORAL | 1 refills | Status: DC

## 2017-11-12 NOTE — Progress Notes (Signed)
Subjective:    Patient ID: Jordan Mills, male    DOB: 01/14/1951, 67 y.o.   MRN: [id number] Chief Complaint  Patient presents with  . Follow-up    Wound check   Patient presents sooner than his scheduled appointment.  The patient was last seen in November 2018 when I removed his remaining staples from his right below the knee amputation site.  The granddaughter called concerned about the patient's stump incision site.  The patient presents today with some sensitivity/discomfort to the medial aspect of the stump.  Patient states this area is so sensitive that just touching it hurts.  He denies any issues with the incision site.  Denies any erythema or drainage from the site.  The patient still has his Steri-Strips in place for when they were placed in November 2018.  Patient denies any fever, nausea vomiting.   Review of Systems  Constitutional: Negative.   HENT: Negative.   Eyes: Negative.   Respiratory: Negative.   Cardiovascular: Negative.   Gastrointestinal: Negative.   Endocrine: Negative.   Genitourinary: Negative.   Musculoskeletal: Negative.   Skin: Positive for wound.  Allergic/Immunologic: Negative.   Neurological: Negative.   Hematological: Negative.   Psychiatric/Behavioral: Negative.       Objective:   Physical Exam  Constitutional: He is oriented to person, place, and time. He appears well-developed and well-nourished. No distress.  HENT:  Head: Normocephalic and atraumatic.  Eyes: Conjunctivae are normal. Pupils are equal, round, and reactive to light.  Neck: Normal range of motion.  Cardiovascular: Normal rate, regular rhythm, normal heart sounds and intact distal pulses.  Pulses:      Radial pulses are 2+ on the right side, and 2+ on the left side.  Right below the knee amputation  Pulmonary/Chest: Effort normal and breath sounds normal.  Musculoskeletal: Normal range of motion. He exhibits no edema.  Neurological: He is alert and oriented to person,  place, and time.  Skin: He is not diaphoretic.  Right below the knee amputation site: I remove the old Steri-Strips as they were dirty and holding scabs in place on the incision.  There are 3 areas of 0.5 cm x 0.5 cm irritations.  The wound beds are full of granulation tissue.  There is no drainage.  There is no necrotic tissue.  There is no surrounding erythema.  There is no cellulitis.  The stump itself is soft.  There is no induration.  Psychiatric: He has a normal mood and affect. His behavior is normal. Judgment and thought content normal.  Vitals reviewed.  BP (!) 143/78 (BP Location: Right Arm, Patient Position: Sitting)   Pulse 64   Resp 17   Ht 5\' 6"  (1.676 m)   Wt 130 lb (59 kg)   BMI 20.98 kg/m   Past Medical History:  Diagnosis Date  . Gout   . HLD (hyperlipidemia)   . Hypertension   . Peripheral vascular disease ([place])    Social History   Socioeconomic History  . Marital status: Married    Spouse name: Not on file  . Number of children: Not on file  . Years of education: Not on file  . Highest education level: Not on file  Social Needs  . Financial resource strain: Not on file  . Food insecurity - worry: Not on file  . Food insecurity - inability: Not on file  . Transportation needs - medical: Not on file  . Transportation needs - non-medical: Not on file  Occupational  History  . Not on file  Tobacco Use  . Smoking status: Former Smoker    Packs/day: 0.20    Types: Cigarettes    Last attempt to quit: 06/13/2017    Years since quitting: 0.4  . Smokeless tobacco: Never Used  Substance and Sexual Activity  . Alcohol use: No  . Drug use: No  . Sexual activity: Not on file  Other Topics Concern  . Not on file  Social History Narrative  . Not on file   Past Surgical History:  Procedure Laterality Date  . AMPUTATION Right 08/28/2017   Procedure: AMPUTATION BELOW KNEE;  Surgeon: Algernon Huxley, MD;  Location: [facility] ORS;  Service: Vascular;  Laterality: Right;    . AMPUTATION TOE Right 07/19/2017   Procedure: AMPUTATION TOE/Rt 4th MPJ;  Surgeon: Sharlotte Alamo, DPM;  Location: [facility] ORS;  Service: Podiatry;  Laterality: Right;  . LOWER EXTREMITY ANGIOGRAPHY Right 07/01/2017   Procedure: Lower Extremity Angiography;  Surgeon: Algernon Huxley, MD;  Location: [place] [facility];  Service: Cardiovascular;  Laterality: Right;  . LOWER EXTREMITY ANGIOGRAPHY Right 08/15/2017   Procedure: Lower Extremity Angiography;  Surgeon: Algernon Huxley, MD;  Location: [place] [facility];  Service: Cardiovascular;  Laterality: Right;  . LOWER EXTREMITY ANGIOGRAPHY Right 08/16/2017   Procedure: LOWER EXTREMITY ANGIOGRAPHY;  Surgeon: Algernon Huxley, MD;  Location: [place] [facility];  Service: Cardiovascular;  Laterality: Right;  . LOWER EXTREMITY INTERVENTION  08/15/2017   Procedure: LOWER EXTREMITY INTERVENTION;  Surgeon: Algernon Huxley, MD;  Location: [place] [facility];  Service: Cardiovascular;;  . TONSILLECTOMY     Family History  Problem Relation Age of Onset  . Hypertension Mother   . Hyperlipidemia Mother   . Diabetes Mother   . Stroke Mother   . Lung cancer Sister    No Known Allergies     Assessment & Plan:  Patient presents sooner than his scheduled appointment.  The patient was last seen in November 2018 when I removed his remaining staples from his right below the knee amputation site.  The granddaughter called concerned about the patient's stump incision site.  The patient presents today with some sensitivity/discomfort to the medial aspect of the stump.  Patient states this area is so sensitive that just touching it hurts.  He denies any issues with the incision site.  Denies any erythema or drainage from the site.  The patient still has his Steri-Strips in place for when they were placed in November 2018.  Patient denies any fever, nausea vomiting.  1. Hx of BKA, right ([place]) - Stable I remove the patient's Steri-Strips as they were dirty and holding old scabs  onto the incision. I encouraged the patient to gently clean the area when he showers with a gentle antibacterial soap. There are 3 areas of "irritations" not really wounds.  There is healthy granulation tissue at these areas. There are no signs of infection. The medial aspect of the stump where the patient is experiencing his discomfort has healed well there is no surrounding erythema or cellulitis the area is soft. We will start the patient on Neurontin for nerve pain 300 mg at night.  If he tolerates this we can always increase the dose. We will also prescribe patient some Silvadene to place to the areas of irritation. The patient will follow up in 1 week for a wound/medication check  2. Hyperlipidemia, unspecified hyperlipidemia type - Stable Encouraged good control as its slows the progression of  atherosclerotic disease  3. Tobacco use disorder - Stable We had a discussion for approximately 5 minutes regarding the absolute need for smoking cessation due to the deleterious nature of tobacco on the vascular system. We discussed the tobacco use would diminish patency of any intervention, and likely significantly worsen progressio of disease. We discussed multiple agents for quitting including replacement therapy or medications to reduce cravings such as Chantix. The patient voices their understanding of the importance of smoking cessation.  Current Outpatient Medications on File Prior to Visit  Medication Sig Dispense Refill  . amLODipine (NORVASC) 5 MG tablet Take 5 mg by mouth 2 (two) times daily.  10  . aspirin EC 81 MG tablet Take by mouth.    Marland Kitchen atorvastatin (LIPITOR) 10 MG tablet Take 1 tablet (10 mg total) by mouth daily. 30 tablet 11  . clopidogrel (PLAVIX) 75 MG tablet Take 1 tablet (75 mg total) by mouth daily. 30 tablet 11  . docusate (COLACE) 50 MG/5ML liquid Take 5 mLs (50 mg total) daily by mouth. 100 mL 0  . ferrous sulfate 325 (65 FE) MG tablet Take 1 tablet (325 mg total) 2  (two) times daily with a meal by mouth. 60 tablet 3  . lisinopril-hydrochlorothiazide (PRINZIDE,ZESTORETIC) 20-12.5 MG tablet Take 2 tablets by mouth daily.  5  . metoprolol (LOPRESSOR) 50 MG tablet Take 50 mg by mouth 2 (two) times daily.  4  . multivitamin-iron-minerals-folic acid (CENTRUM) chewable tablet Chew 1 tablet by mouth daily.    Marland Kitchen oxyCODONE-acetaminophen (PERCOCET/ROXICET) 5-325 MG tablet Take 1-2 tablets by mouth every 6 (six) hours as needed for moderate pain or severe pain. 30 tablet 0   No current facility-administered medications on file prior to visit.    There are no Patient Instructions on file for this visit. No Follow-up on file.  Kalli Greenfield A Keyshun Elpers, PA-C

## 2017-11-13 DIAGNOSIS — I10 Essential (primary) hypertension: Secondary | ICD-10-CM | POA: Diagnosis not present

## 2017-11-13 DIAGNOSIS — I739 Peripheral vascular disease, unspecified: Secondary | ICD-10-CM | POA: Diagnosis not present

## 2017-11-13 DIAGNOSIS — E785 Hyperlipidemia, unspecified: Secondary | ICD-10-CM | POA: Diagnosis not present

## 2017-11-13 DIAGNOSIS — Z4781 Encounter for orthopedic aftercare following surgical amputation: Secondary | ICD-10-CM | POA: Diagnosis not present

## 2017-11-13 DIAGNOSIS — Z89511 Acquired absence of right leg below knee: Secondary | ICD-10-CM | POA: Diagnosis not present

## 2017-11-13 DIAGNOSIS — M109 Gout, unspecified: Secondary | ICD-10-CM | POA: Diagnosis not present

## 2017-11-15 ENCOUNTER — Ambulatory Visit (INDEPENDENT_AMBULATORY_CARE_PROVIDER_SITE_OTHER): Payer: Medicare Other | Admitting: Vascular Surgery

## 2017-11-15 DIAGNOSIS — Z4781 Encounter for orthopedic aftercare following surgical amputation: Secondary | ICD-10-CM | POA: Diagnosis not present

## 2017-11-15 DIAGNOSIS — I739 Peripheral vascular disease, unspecified: Secondary | ICD-10-CM | POA: Diagnosis not present

## 2017-11-15 DIAGNOSIS — M109 Gout, unspecified: Secondary | ICD-10-CM | POA: Diagnosis not present

## 2017-11-15 DIAGNOSIS — I10 Essential (primary) hypertension: Secondary | ICD-10-CM | POA: Diagnosis not present

## 2017-11-15 DIAGNOSIS — Z89511 Acquired absence of right leg below knee: Secondary | ICD-10-CM | POA: Diagnosis not present

## 2017-11-15 DIAGNOSIS — E785 Hyperlipidemia, unspecified: Secondary | ICD-10-CM | POA: Diagnosis not present

## 2017-11-19 DIAGNOSIS — M109 Gout, unspecified: Secondary | ICD-10-CM | POA: Diagnosis not present

## 2017-11-19 DIAGNOSIS — Z4781 Encounter for orthopedic aftercare following surgical amputation: Secondary | ICD-10-CM | POA: Diagnosis not present

## 2017-11-19 DIAGNOSIS — I10 Essential (primary) hypertension: Secondary | ICD-10-CM | POA: Diagnosis not present

## 2017-11-19 DIAGNOSIS — E785 Hyperlipidemia, unspecified: Secondary | ICD-10-CM | POA: Diagnosis not present

## 2017-11-19 DIAGNOSIS — I739 Peripheral vascular disease, unspecified: Secondary | ICD-10-CM | POA: Diagnosis not present

## 2017-11-19 DIAGNOSIS — Z89511 Acquired absence of right leg below knee: Secondary | ICD-10-CM | POA: Diagnosis not present

## 2017-11-21 DIAGNOSIS — I10 Essential (primary) hypertension: Secondary | ICD-10-CM | POA: Diagnosis not present

## 2017-11-21 DIAGNOSIS — M109 Gout, unspecified: Secondary | ICD-10-CM | POA: Diagnosis not present

## 2017-11-21 DIAGNOSIS — E785 Hyperlipidemia, unspecified: Secondary | ICD-10-CM | POA: Diagnosis not present

## 2017-11-21 DIAGNOSIS — Z89511 Acquired absence of right leg below knee: Secondary | ICD-10-CM | POA: Diagnosis not present

## 2017-11-21 DIAGNOSIS — I739 Peripheral vascular disease, unspecified: Secondary | ICD-10-CM | POA: Diagnosis not present

## 2017-11-21 DIAGNOSIS — Z4781 Encounter for orthopedic aftercare following surgical amputation: Secondary | ICD-10-CM | POA: Diagnosis not present

## 2017-11-22 ENCOUNTER — Encounter (INDEPENDENT_AMBULATORY_CARE_PROVIDER_SITE_OTHER): Payer: Self-pay | Admitting: Vascular Surgery

## 2017-11-22 ENCOUNTER — Ambulatory Visit (INDEPENDENT_AMBULATORY_CARE_PROVIDER_SITE_OTHER): Payer: Medicare Other | Admitting: Vascular Surgery

## 2017-11-22 VITALS — BP 145/86 | HR 99 | Resp 17 | Ht 66.0 in | Wt 130.0 lb

## 2017-11-22 DIAGNOSIS — I10 Essential (primary) hypertension: Secondary | ICD-10-CM

## 2017-11-22 DIAGNOSIS — Z89511 Acquired absence of right leg below knee: Secondary | ICD-10-CM

## 2017-11-22 DIAGNOSIS — I7025 Atherosclerosis of native arteries of other extremities with ulceration: Secondary | ICD-10-CM

## 2017-11-22 NOTE — Progress Notes (Signed)
Patient ID: Jordan Mills, male   DOB: 03/18/1951, 67 y.o.   MRN: [id number]  Chief Complaint  Patient presents with  . Follow-up    1 week Wound check    HPI Jordan Mills is a 67 y.o. male.  Patient returns in follow-up for a wound check.  He is about 3 months status post right below-knee amputation at this point.  He has a small circular ulceration in the this is not particularly deep.  There is no exposed bone.  There is some fibrinous exudate which I sharply debrided today with scissors.  There is decent granulation tissue under this. he has no fevers or chills or systemic symptoms.   Past Medical History:  Diagnosis Date  . Gout   . HLD (hyperlipidemia)   . Hypertension   . Peripheral vascular disease [facility])     Past Surgical History:  Procedure Laterality Date  . AMPUTATION Right 08/28/2017   Procedure: AMPUTATION BELOW KNEE;  Surgeon: Algernon Huxley, MD;  Location: [facility] ORS;  Service: Vascular;  Laterality: Right;  . AMPUTATION TOE Right 07/19/2017   Procedure: AMPUTATION TOE/Rt 4th MPJ;  Surgeon: Sharlotte Alamo, DPM;  Location: [facility] ORS;  Service: Podiatry;  Laterality: Right;  . LOWER EXTREMITY ANGIOGRAPHY Right 07/01/2017   Procedure: Lower Extremity Angiography;  Surgeon: Algernon Huxley, MD;  Location: [place] [facility];  Service: Cardiovascular;  Laterality: Right;  . LOWER EXTREMITY ANGIOGRAPHY Right 08/15/2017   Procedure: Lower Extremity Angiography;  Surgeon: Algernon Huxley, MD;  Location: [place] [facility];  Service: Cardiovascular;  Laterality: Right;  . LOWER EXTREMITY ANGIOGRAPHY Right 08/16/2017   Procedure: LOWER EXTREMITY ANGIOGRAPHY;  Surgeon: Algernon Huxley, MD;  Location: [place] [facility];  Service: Cardiovascular;  Laterality: Right;  . LOWER EXTREMITY INTERVENTION  08/15/2017   Procedure: LOWER EXTREMITY INTERVENTION;  Surgeon: Algernon Huxley, MD;  Location: [place] [facility];  Service: Cardiovascular;;  . TONSILLECTOMY        No Known  Allergies  Current Outpatient Medications  Medication Sig Dispense Refill  . amLODipine (NORVASC) 5 MG tablet Take 5 mg by mouth 2 (two) times daily.  10  . aspirin EC 81 MG tablet Take by mouth.    Marland Kitchen atorvastatin (LIPITOR) 10 MG tablet Take 1 tablet (10 mg total) by mouth daily. 30 tablet 11  . clopidogrel (PLAVIX) 75 MG tablet Take 1 tablet (75 mg total) by mouth daily. 30 tablet 11  . docusate (COLACE) 50 MG/5ML liquid Take 5 mLs (50 mg total) daily by mouth. 100 mL 0  . ferrous sulfate 325 (65 FE) MG tablet Take 1 tablet (325 mg total) 2 (two) times daily with a meal by mouth. 60 tablet 3  . gabapentin (NEURONTIN) 300 MG capsule Take 1 capsule (300 mg total) by mouth 2 (two) times daily. 60 capsule 1  . lisinopril-hydrochlorothiazide (PRINZIDE,ZESTORETIC) 20-12.5 MG tablet Take 2 tablets by mouth daily.  5  . metoprolol (LOPRESSOR) 50 MG tablet Take 50 mg by mouth 2 (two) times daily.  4  . multivitamin-iron-minerals-folic acid (CENTRUM) chewable tablet Chew 1 tablet by mouth daily.    Marland Kitchen oxyCODONE-acetaminophen (PERCOCET/ROXICET) 5-325 MG tablet Take 1-2 tablets by mouth every 6 (six) hours as needed for moderate pain or severe pain. 30 tablet 0  . silver sulfADIAZINE (SILVADENE) 1 % cream Apply 1 application topically daily. Apply to stump area 50 g 1   No current facility-administered medications for this visit.  Physical Exam BP (!) 145/86 (BP Location: Left Arm, Patient Position: Sitting)   Pulse 99   Resp 17   Ht 5\' 6"  (1.676 m)   Wt 59 kg (130 lb)   BMI 20.98 kg/m  Gen:  WD/WN, NAD Skin: incision C/D/I except small 2 cm circular ulceration as described above that was debrided today.     Assessment/Plan:  Hypertension blood pressure control important in reducing the progression of atherosclerotic disease. On appropriate oral medications.   Hx of BKA, right ([place]) Small wound as above.  No signs of infection.  Continue wet-to-dry dressings.  Recheck wound in  3-4 weeks in the office.      [name] 11/22/2017, 11:01 AM   This note was created with Dragon medical transcription system.  Any errors from dictation are unintentional.

## 2017-11-22 NOTE — Assessment & Plan Note (Signed)
Small wound as above.  No signs of infection.  Continue wet-to-dry dressings.  Recheck wound in 3-4 weeks in the office.

## 2017-11-22 NOTE — Assessment & Plan Note (Signed)
blood pressure control important in reducing the progression of atherosclerotic disease. On appropriate oral medications.  

## 2017-11-26 DIAGNOSIS — E785 Hyperlipidemia, unspecified: Secondary | ICD-10-CM | POA: Diagnosis not present

## 2017-11-26 DIAGNOSIS — Z89511 Acquired absence of right leg below knee: Secondary | ICD-10-CM | POA: Diagnosis not present

## 2017-11-26 DIAGNOSIS — I10 Essential (primary) hypertension: Secondary | ICD-10-CM | POA: Diagnosis not present

## 2017-11-26 DIAGNOSIS — I739 Peripheral vascular disease, unspecified: Secondary | ICD-10-CM | POA: Diagnosis not present

## 2017-11-26 DIAGNOSIS — M109 Gout, unspecified: Secondary | ICD-10-CM | POA: Diagnosis not present

## 2017-11-26 DIAGNOSIS — Z4781 Encounter for orthopedic aftercare following surgical amputation: Secondary | ICD-10-CM | POA: Diagnosis not present

## 2017-11-28 DIAGNOSIS — I739 Peripheral vascular disease, unspecified: Secondary | ICD-10-CM | POA: Diagnosis not present

## 2017-11-28 DIAGNOSIS — M109 Gout, unspecified: Secondary | ICD-10-CM | POA: Diagnosis not present

## 2017-11-28 DIAGNOSIS — E785 Hyperlipidemia, unspecified: Secondary | ICD-10-CM | POA: Diagnosis not present

## 2017-11-28 DIAGNOSIS — I10 Essential (primary) hypertension: Secondary | ICD-10-CM | POA: Diagnosis not present

## 2017-11-28 DIAGNOSIS — Z4781 Encounter for orthopedic aftercare following surgical amputation: Secondary | ICD-10-CM | POA: Diagnosis not present

## 2017-11-28 DIAGNOSIS — Z89511 Acquired absence of right leg below knee: Secondary | ICD-10-CM | POA: Diagnosis not present

## 2017-12-03 DIAGNOSIS — I739 Peripheral vascular disease, unspecified: Secondary | ICD-10-CM | POA: Diagnosis not present

## 2017-12-03 DIAGNOSIS — M109 Gout, unspecified: Secondary | ICD-10-CM | POA: Diagnosis not present

## 2017-12-03 DIAGNOSIS — E785 Hyperlipidemia, unspecified: Secondary | ICD-10-CM | POA: Diagnosis not present

## 2017-12-03 DIAGNOSIS — Z89511 Acquired absence of right leg below knee: Secondary | ICD-10-CM | POA: Diagnosis not present

## 2017-12-03 DIAGNOSIS — Z4781 Encounter for orthopedic aftercare following surgical amputation: Secondary | ICD-10-CM | POA: Diagnosis not present

## 2017-12-03 DIAGNOSIS — I10 Essential (primary) hypertension: Secondary | ICD-10-CM | POA: Diagnosis not present

## 2017-12-05 DIAGNOSIS — M109 Gout, unspecified: Secondary | ICD-10-CM | POA: Diagnosis not present

## 2017-12-05 DIAGNOSIS — Z4781 Encounter for orthopedic aftercare following surgical amputation: Secondary | ICD-10-CM | POA: Diagnosis not present

## 2017-12-05 DIAGNOSIS — I10 Essential (primary) hypertension: Secondary | ICD-10-CM | POA: Diagnosis not present

## 2017-12-05 DIAGNOSIS — E785 Hyperlipidemia, unspecified: Secondary | ICD-10-CM | POA: Diagnosis not present

## 2017-12-05 DIAGNOSIS — Z89511 Acquired absence of right leg below knee: Secondary | ICD-10-CM | POA: Diagnosis not present

## 2017-12-05 DIAGNOSIS — I739 Peripheral vascular disease, unspecified: Secondary | ICD-10-CM | POA: Diagnosis not present

## 2017-12-10 DIAGNOSIS — I10 Essential (primary) hypertension: Secondary | ICD-10-CM | POA: Diagnosis not present

## 2017-12-10 DIAGNOSIS — I739 Peripheral vascular disease, unspecified: Secondary | ICD-10-CM | POA: Diagnosis not present

## 2017-12-10 DIAGNOSIS — M109 Gout, unspecified: Secondary | ICD-10-CM | POA: Diagnosis not present

## 2017-12-10 DIAGNOSIS — E785 Hyperlipidemia, unspecified: Secondary | ICD-10-CM | POA: Diagnosis not present

## 2017-12-10 DIAGNOSIS — Z89511 Acquired absence of right leg below knee: Secondary | ICD-10-CM | POA: Diagnosis not present

## 2017-12-10 DIAGNOSIS — Z4781 Encounter for orthopedic aftercare following surgical amputation: Secondary | ICD-10-CM | POA: Diagnosis not present

## 2017-12-16 ENCOUNTER — Other Ambulatory Visit (INDEPENDENT_AMBULATORY_CARE_PROVIDER_SITE_OTHER): Payer: Self-pay | Admitting: Vascular Surgery

## 2017-12-17 DIAGNOSIS — I739 Peripheral vascular disease, unspecified: Secondary | ICD-10-CM | POA: Diagnosis not present

## 2017-12-17 DIAGNOSIS — M109 Gout, unspecified: Secondary | ICD-10-CM | POA: Diagnosis not present

## 2017-12-17 DIAGNOSIS — Z89511 Acquired absence of right leg below knee: Secondary | ICD-10-CM | POA: Diagnosis not present

## 2017-12-17 DIAGNOSIS — Z4781 Encounter for orthopedic aftercare following surgical amputation: Secondary | ICD-10-CM | POA: Diagnosis not present

## 2017-12-17 DIAGNOSIS — E785 Hyperlipidemia, unspecified: Secondary | ICD-10-CM | POA: Diagnosis not present

## 2017-12-17 DIAGNOSIS — I10 Essential (primary) hypertension: Secondary | ICD-10-CM | POA: Diagnosis not present

## 2017-12-20 ENCOUNTER — Encounter (INDEPENDENT_AMBULATORY_CARE_PROVIDER_SITE_OTHER): Payer: Self-pay | Admitting: Vascular Surgery

## 2017-12-20 ENCOUNTER — Ambulatory Visit (INDEPENDENT_AMBULATORY_CARE_PROVIDER_SITE_OTHER): Payer: Medicare Other | Admitting: Vascular Surgery

## 2017-12-20 VITALS — BP 166/87 | HR 72 | Resp 12 | Ht 66.0 in | Wt 136.0 lb

## 2017-12-20 DIAGNOSIS — E785 Hyperlipidemia, unspecified: Secondary | ICD-10-CM | POA: Diagnosis not present

## 2017-12-20 DIAGNOSIS — I739 Peripheral vascular disease, unspecified: Secondary | ICD-10-CM

## 2017-12-20 DIAGNOSIS — I10 Essential (primary) hypertension: Secondary | ICD-10-CM

## 2017-12-20 DIAGNOSIS — I7025 Atherosclerosis of native arteries of other extremities with ulceration: Secondary | ICD-10-CM

## 2017-12-20 DIAGNOSIS — Z89511 Acquired absence of right leg below knee: Secondary | ICD-10-CM

## 2017-12-20 NOTE — Assessment & Plan Note (Signed)
Overall healing quite well.  Now about 4 months postop.  Prosthesis referral as well as a stump shrinker.

## 2017-12-20 NOTE — Assessment & Plan Note (Signed)
Check left leg perfusion in about 3 months.

## 2017-12-20 NOTE — Patient Instructions (Signed)

## 2017-12-20 NOTE — Assessment & Plan Note (Signed)
blood pressure control important in reducing the progression of atherosclerotic disease. On appropriate oral medications.  

## 2017-12-20 NOTE — Progress Notes (Signed)
MRN : [id number]  Jordan Mills is a 67 y.o. (1951-07-26) male who presents with chief complaint of  Chief Complaint  Patient presents with  . Follow-up    4 week f/u  .  History of Present Illness: Patient returns today in follow up of his right BKA wound.  He has a small shallow opening in the midportion that is less than a centimeter and is clean.  He still has a fair bit of phantom pain but has become much more mobile.  He is ready to be seen by the prosthesis folks to start the process of getting his prosthetic.  No fevers or chills.  No current left leg symptoms.  Current Outpatient Medications  Medication Sig Dispense Refill  . amLODipine (NORVASC) 5 MG tablet Take 5 mg by mouth 2 (two) times daily.  10  . aspirin EC 81 MG tablet Take by mouth.    Marland Kitchen atorvastatin (LIPITOR) 10 MG tablet Take 1 tablet (10 mg total) by mouth daily. 30 tablet 11  . clopidogrel (PLAVIX) 75 MG tablet Take 1 tablet (75 mg total) by mouth daily. 30 tablet 11  . docusate (COLACE) 50 MG/5ML liquid Take 5 mLs (50 mg total) daily by mouth. 100 mL 0  . ferrous sulfate 325 (65 FE) MG tablet Take 1 tablet (325 mg total) 2 (two) times daily with a meal by mouth. 60 tablet 3  . gabapentin (NEURONTIN) 300 MG capsule Take 1 capsule (300 mg total) by mouth 2 (two) times daily. 60 capsule 1  . lisinopril-hydrochlorothiazide (PRINZIDE,ZESTORETIC) 20-12.5 MG tablet Take 2 tablets by mouth daily.  5  . metoprolol (LOPRESSOR) 50 MG tablet Take 50 mg by mouth 2 (two) times daily.  4  . multivitamin-iron-minerals-folic acid (CENTRUM) chewable tablet Chew 1 tablet by mouth daily.    Marland Kitchen oxyCODONE-acetaminophen (PERCOCET/ROXICET) 5-325 MG tablet Take 1-2 tablets by mouth every 6 (six) hours as needed for moderate pain or severe pain. 30 tablet 0  . silver sulfADIAZINE (SILVADENE) 1 % cream Apply 1 application topically daily. Apply to stump area 50 g 1   No current facility-administered medications for this visit.      Past Medical History:  Diagnosis Date  . Gout   . HLD (hyperlipidemia)   . Hypertension   . Peripheral vascular disease [facility])     Past Surgical History:  Procedure Laterality Date  . AMPUTATION Right 08/28/2017   Procedure: AMPUTATION BELOW KNEE;  Surgeon: Algernon Huxley, MD;  Location: [facility] ORS;  Service: Vascular;  Laterality: Right;  . AMPUTATION TOE Right 07/19/2017   Procedure: AMPUTATION TOE/Rt 4th MPJ;  Surgeon: Sharlotte Alamo, DPM;  Location: [facility] ORS;  Service: Podiatry;  Laterality: Right;  . LOWER EXTREMITY ANGIOGRAPHY Right 07/01/2017   Procedure: Lower Extremity Angiography;  Surgeon: Algernon Huxley, MD;  Location: [place] [facility];  Service: Cardiovascular;  Laterality: Right;  . LOWER EXTREMITY ANGIOGRAPHY Right 08/15/2017   Procedure: Lower Extremity Angiography;  Surgeon: Algernon Huxley, MD;  Location: [place] [facility];  Service: Cardiovascular;  Laterality: Right;  . LOWER EXTREMITY ANGIOGRAPHY Right 08/16/2017   Procedure: LOWER EXTREMITY ANGIOGRAPHY;  Surgeon: Algernon Huxley, MD;  Location: [place] [facility];  Service: Cardiovascular;  Laterality: Right;  . LOWER EXTREMITY INTERVENTION  08/15/2017   Procedure: LOWER EXTREMITY INTERVENTION;  Surgeon: Algernon Huxley, MD;  Location: [place] CV LAB;  Service: Cardiovascular;;  . TONSILLECTOMY      Social History Social History  Tobacco Use  . Smoking status: Former Smoker    Packs/day: 0.20    Types: Cigarettes    Last attempt to quit: 06/13/2017    Years since quitting: 0.5  . Smokeless tobacco: Never Used  Substance Use Topics  . Alcohol use: No  . Drug use: No    Family History Family History  Problem Relation Age of Onset  . Hypertension Mother   . Hyperlipidemia Mother   . Diabetes Mother   . Stroke Mother   . Lung cancer Sister     No Known Allergies   REVIEW OF SYSTEMS (Negative unless checked)  Constitutional: [] Weight loss  [] Fever  [] Chills Cardiac: [] Chest pain   [] Chest pressure    [] Palpitations   [] Shortness of breath when laying flat   [] Shortness of breath at rest   [] Shortness of breath with exertion. Vascular:  [] Pain in legs with walking   [] Pain in legs at rest   [] Pain in legs when laying flat   [] Claudication   [] Pain in feet when walking  [] Pain in feet at rest  [] Pain in feet when laying flat   [] History of DVT   [] Phlebitis   [x] Swelling in legs   [] Varicose veins   [] Non-healing ulcers Pulmonary:   [] Uses home oxygen   [] Productive cough   [] Hemoptysis   [] Wheeze  [] COPD   [] Asthma Neurologic:  [] Dizziness  [] Blackouts   [] Seizures   [] History of stroke   [] History of TIA  [] Aphasia   [] Temporary blindness   [] Dysphagia   [] Weakness or numbness in arms   [] Weakness or numbness in legs Musculoskeletal:  [x] Arthritis   [] Joint swelling   [] Joint pain   [] Low back pain Hematologic:  [] Easy bruising  [] Easy bleeding   [] Hypercoagulable state   [] Anemic   Gastrointestinal:  [] Blood in stool   [] Vomiting blood  [] Gastroesophageal reflux/heartburn   [] Abdominal pain Genitourinary:  [] Chronic kidney disease   [] Difficult urination  [] Frequent urination  [] Burning with urination   [] Hematuria Skin:  [] Rashes   [x] Ulcers   [x] Wounds Psychological:  [] History of anxiety   []  History of major depression.  Physical Examination  BP (!) 166/87 (BP Location: Right Arm, Patient Position: Sitting)   Pulse 72   Resp 12   Ht 5\' 6"  (1.676 m)   Wt 61.7 kg (136 lb)   BMI 21.95 kg/m  Gen:  WD/WN, NAD Head: [place]/AT, No temporalis wasting. Ear/Nose/Throat: Hearing grossly intact, nares w/o erythema or drainage, dentition poor Eyes: Conjunctiva clear. Sclera non-icteric Neck: Supple.  Trachea midline Pulmonary:  Good air movement, no use of accessory muscles.  Cardiac: RRR, no JVD Vascular:  Vessel Right Left  Radial Palpable Palpable                          PT Not Palpable 1+ Palpable  DP Not Palpable 1+ Palpable    Musculoskeletal: M/S 5/5 throughout.  Walks with  a walker.  Right BKA with a <1 cm shallow opening on the mid to lateral portion of the wound.  No erythema or drainage. Neurologic: Sensation grossly intact in extremities.  Symmetrical.  Speech is fluent.  Psychiatric: Judgment intact, Mood & affect appropriate for pt's clinical situation. Dermatologic: Right BKA incision as above.      Labs No results found for this or any previous visit (from the past 2160 hour(s)).  Radiology No results found.    Assessment/Plan  Hx of BKA, right ([place]) Overall healing quite well.  Now about 4 months postop.  Prosthesis referral as well as a stump shrinker.  HLD (hyperlipidemia) lipid control important in reducing the progression of atherosclerotic disease. Continue statin therapy   Hypertension blood pressure control important in reducing the progression of atherosclerotic disease. On appropriate oral medications.   Peripheral vascular disease ([place]) Check left leg perfusion in about 3 months.    [name], MD  12/20/2017 10:06 AM    This note was created with Dragon medical transcription system.  Any errors from dictation are purely unintentional

## 2017-12-20 NOTE — Assessment & Plan Note (Signed)
lipid control important in reducing the progression of atherosclerotic disease. Continue statin therapy  

## 2017-12-26 DIAGNOSIS — I739 Peripheral vascular disease, unspecified: Secondary | ICD-10-CM | POA: Diagnosis not present

## 2017-12-26 DIAGNOSIS — Z4781 Encounter for orthopedic aftercare following surgical amputation: Secondary | ICD-10-CM | POA: Diagnosis not present

## 2017-12-26 DIAGNOSIS — Z89511 Acquired absence of right leg below knee: Secondary | ICD-10-CM | POA: Diagnosis not present

## 2017-12-26 DIAGNOSIS — M109 Gout, unspecified: Secondary | ICD-10-CM | POA: Diagnosis not present

## 2017-12-26 DIAGNOSIS — E785 Hyperlipidemia, unspecified: Secondary | ICD-10-CM | POA: Diagnosis not present

## 2017-12-26 DIAGNOSIS — I10 Essential (primary) hypertension: Secondary | ICD-10-CM | POA: Diagnosis not present

## 2017-12-27 ENCOUNTER — Other Ambulatory Visit (INDEPENDENT_AMBULATORY_CARE_PROVIDER_SITE_OTHER): Payer: Self-pay | Admitting: Vascular Surgery

## 2018-01-07 ENCOUNTER — Telehealth (INDEPENDENT_AMBULATORY_CARE_PROVIDER_SITE_OTHER): Payer: Self-pay

## 2018-01-07 MED ORDER — ATORVASTATIN CALCIUM 10 MG PO TABS: 10.0000 mg | ORAL_TABLET | Freq: Every day | ORAL | 11 refills | Status: DC

## 2018-01-07 MED ORDER — CLOPIDOGREL BISULFATE 75 MG PO TABS: 75.0000 mg | ORAL_TABLET | Freq: Every day | ORAL | 11 refills | Status: DC

## 2018-01-07 NOTE — Telephone Encounter (Signed)
Patient's granddaughter called to see if we can send in refills on the patient's Plavix and Lipitor.  I told her that I will get them sent in electronically to the pharmacy now.

## 2018-01-22 NOTE — Telephone Encounter (Signed)
complete

## 2018-02-13 ENCOUNTER — Telehealth (INDEPENDENT_AMBULATORY_CARE_PROVIDER_SITE_OTHER): Payer: Self-pay

## 2018-02-13 NOTE — Telephone Encounter (Signed)
Faxed to [facility] the prescription for Prosthetic evaluation / gait training from 10/10/17, this was faxed on 02/11/18.

## 2018-03-05 ENCOUNTER — Ambulatory Visit: Payer: Medicare Other | Attending: Vascular Surgery | Admitting: Physical Therapy

## 2018-03-05 ENCOUNTER — Encounter: Payer: Self-pay | Admitting: Physical Therapy

## 2018-03-05 DIAGNOSIS — R2689 Other abnormalities of gait and mobility: Secondary | ICD-10-CM | POA: Insufficient documentation

## 2018-03-05 DIAGNOSIS — M6281 Muscle weakness (generalized): Secondary | ICD-10-CM | POA: Insufficient documentation

## 2018-03-05 DIAGNOSIS — R2681 Unsteadiness on feet: Secondary | ICD-10-CM | POA: Insufficient documentation

## 2018-03-05 NOTE — Therapy (Signed)
[place] MAIN [facility] SERVICES [address] [place], [place], [address] Phone: [phone]   Fax:  [phone]  Physical Therapy Cancellation Note  Patient Details  Name: Jordan Mills MRN: [id number] Date of Birth: Dec 17, 1950 No data recorded  Encounter Date: 03/05/2018  PT End of Session - 03/05/18 0947    Visit Number  --    Number of Visits  --    Date for PT Re-Evaluation  --    Authorization Type  --       Past Medical History:  Diagnosis Date  . Gout   . HLD (hyperlipidemia)   . Hypertension   . Peripheral vascular disease [facility])     Past Surgical History:  Procedure Laterality Date  . AMPUTATION Right 08/28/2017   Procedure: AMPUTATION BELOW KNEE;  Surgeon: Algernon Huxley, MD;  Location: [facility];  Service: Vascular;  Laterality: Right;  . AMPUTATION TOE Right 07/19/2017   Procedure: AMPUTATION TOE/Rt 4th MPJ;  Surgeon: Sharlotte Alamo, DPM;  Location: [facility];  Service: Podiatry;  Laterality: Right;  . LOWER EXTREMITY ANGIOGRAPHY Right 07/01/2017   Procedure: Lower Extremity Angiography;  Surgeon: Algernon Huxley, MD;  Location: [place] [facility];  Service: Cardiovascular;  Laterality: Right;  . LOWER EXTREMITY ANGIOGRAPHY Right 08/15/2017   Procedure: Lower Extremity Angiography;  Surgeon: Algernon Huxley, MD;  Location: [place] CV LAB;  Service: Cardiovascular;  Laterality: Right;  . LOWER EXTREMITY ANGIOGRAPHY Right 08/16/2017   Procedure: LOWER EXTREMITY ANGIOGRAPHY;  Surgeon: Algernon Huxley, MD;  Location: [place] [facility];  Service: Cardiovascular;  Laterality: Right;  . LOWER EXTREMITY INTERVENTION  08/15/2017   Procedure: LOWER EXTREMITY INTERVENTION;  Surgeon: Algernon Huxley, MD;  Location: [place] [facility];  Service: Cardiovascular;;  . TONSILLECTOMY      There were no vitals filed for this visit.  Subjective Assessment - 03/05/18 1023    Subjective  Pt presented for PT evaluation and vitals taken in sitting  before initiating Evaluation.  BP 203/85, SpO2 95%, pulse 66.  BP taken again and reading 215/86.  Pt reports that he forgot to take his BP medication this morning.  Explained that the pt should return home and take BP immediately.  Pt and wife verbalized understanding.  This PT call pt's MD (Dr. Joan Flores office) and spoke to [place].  Information was relayed on pt's vitals and Caryl Pina plans to call the pt to have the pt come into the office this date.  Pt will be re-schedule for new PT Evaluation date.              Plan - 03/05/18 0947    PT Frequency  --    PT Duration  --           Visit Diagnosis: Muscle weakness (generalized)     Problem List Patient Active Problem List   Diagnosis Date Noted  . Peripheral vascular disease ([place]) 10/22/2017  . Hx of BKA, right ([place]) 09/30/2017  . Gangrene from atherosclerosis, extremities ([place]) 08/25/2017  . Ischemic leg 08/15/2017  . HLD (hyperlipidemia) 07/16/2017  . Hypertension 06/28/2017  . Tobacco use disorder 06/28/2017  . Atherosclerosis of native arteries of the extremities with ulceration ([place]) 06/28/2017    Collie Siad PT, DPT 03/05/2018, 10:26 AM  [place] MAIN [facility] SERVICES [address] [place], [place], [address] Phone: [phone]   Fax:  [phone]  Name: Jordan Mills MRN: [id number] Date of Birth:  05/22/1951   

## 2018-03-06 ENCOUNTER — Ambulatory Visit: Payer: Medicare Other | Admitting: Physical Therapy

## 2018-03-06 ENCOUNTER — Encounter: Payer: Self-pay | Admitting: Physical Therapy

## 2018-03-06 ENCOUNTER — Other Ambulatory Visit: Payer: Self-pay

## 2018-03-06 DIAGNOSIS — M6281 Muscle weakness (generalized): Secondary | ICD-10-CM | POA: Diagnosis not present

## 2018-03-06 DIAGNOSIS — R2689 Other abnormalities of gait and mobility: Secondary | ICD-10-CM | POA: Diagnosis not present

## 2018-03-06 DIAGNOSIS — R2681 Unsteadiness on feet: Secondary | ICD-10-CM

## 2018-03-06 NOTE — Therapy (Signed)
[place] MAIN [facility] SERVICES [address] [place], [place], [address] Phone: [phone]   Fax:  [phone]  Physical Therapy Evaluation  Patient Details  Name: Jordan Mills MRN: [id number] Date of Birth: October 12, 1951 Referring Provider: Sela Hua, PA-C   Encounter Date: 03/06/2018  PT End of Session - 03/06/18 1731    Visit Number  1    Number of Visits  13    Date for PT Re-Evaluation  04/17/18    Authorization Type  1/10 progress note    PT Start Time  1300    PT Stop Time  1359    PT Time Calculation (min)  59 min    Equipment Utilized During Treatment  Gait belt    Activity Tolerance  Patient tolerated treatment well;Treatment limited secondary to medical complications (Comment) elevated BP    Behavior During Therapy  WFL for tasks assessed/performed       Past Medical History:  Diagnosis Date  . Gout   . HLD (hyperlipidemia)   . Hypertension   . Peripheral vascular disease [facility])     Past Surgical History:  Procedure Laterality Date  . AMPUTATION Right 08/28/2017   Procedure: AMPUTATION BELOW KNEE;  Surgeon: Algernon Huxley, MD;  Location: [facility] OR[facility];  Service: Vascular;  Laterality: Right;  . AMPUTATION TOE Right 07/19/2017   Procedure: AMPUTATION TOE/Rt 4th MPJ;  Surgeon: Sharlotte Alamo, DPM;  Location: [facility] OR[facility];  Service: Podiatry;  Laterality: Right;  . LOWER EXTREMITY ANGIOGRAPHY Right 07/01/2017   Procedure: Lower Extremity Angiography;  Surgeon: Algernon Huxley, MD;  Location: [place] [facility];  Service: Cardiovascular;  Laterality: Right;  . LOWER EXTREMITY ANGIOGRAPHY Right 08/15/2017   Procedure: Lower Extremity Angiography;  Surgeon: Algernon Huxley, MD;  Location: [place] CV LAB;  Service: Cardiovascular;  Laterality: Right;  . LOWER EXTREMITY ANGIOGRAPHY Right 08/16/2017   Procedure: LOWER EXTREMITY ANGIOGRAPHY;  Surgeon: Algernon Huxley, MD;  Location: [place] [facility] LAB;  Service: Cardiovascular;   Laterality: Right;  . LOWER EXTREMITY INTERVENTION  08/15/2017   Procedure: LOWER EXTREMITY INTERVENTION;  Surgeon: Algernon Huxley, MD;  Location: [place] CV LAB;  Service: Cardiovascular;;  . TONSILLECTOMY      There were no vitals filed for this visit.   Subjective Assessment - 03/06/18 1728    Subjective  Pt is a 67 y/o M s/p R BKA    Patient is accompained by:  Family member wife and great grandson    Pertinent History  Pt presented yesterday to this PT clinic for evaluation but was unable to be seen due to elevated BP. See note. Pt took his BP medication this morning and will take again as scheduled at 3pm, per pt. Pt is a 67 y/o M s/p R BKA 08/28/17 who presents to therapy for gait training with prosthesis. Pt is wearing his shrinker 24/7. Pt reports he received his prosthesis 2 weeks ago and was told not to use it until he started PT. Pt has not received PT since his BKA. Pt reports he has phantom pain at night but not during the day, pain as high as 9/10. Pt has been ambulating up to 100 yards with RW. Pt reports 1 fall shortly after R BKA but no falls since. Pt is independent with dressing, bathing (gets into tub to take a bath), driving. Goal: to be able to walk with prosthesis. Pt denies any issues getting up his steps, hops up one step at  a time.     Limitations  Walking;Standing    How long can you walk comfortably?  100 yards with RW    Patient Stated Goals  to ambulate with prosthesis and return to work    Currently in Pain?  No/denies         [facility] PT Assessment - 03/06/18 1314      Assessment   Medical Diagnosis  R BKA gait training     Referring Provider  Sela Hua, PA-C    Onset Date/Surgical Date  08/28/17    Hand Dominance  Right    Next MD Visit  July with Dr. Lucky Cowboy    Prior Therapy  Yes for therapy on his R foot      Precautions   Precautions  Fall    Required Braces or Orthoses  Other Brace/Splint    Other Brace/Splint  pt wears R shrinker 24/7       Restrictions   Weight Bearing Restrictions  No      Balance Screen   Has the patient fallen in the past 6 months  Yes    How many times?  1    Has the patient had a decrease in activity level because of a fear of falling?   No    Is the patient reluctant to leave their home because of a fear of falling?   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 with occasional 15 minute breaks and one 30 minute break for lunch    Leisure  Pt used to like to [employer]   Overall Cognitive Status  Within Functional Limits for tasks assessed      ROM / Strength   AROM / PROM / Strength  Strength      Strength   Overall Strength  Deficits    Strength Assessment Site  Hip;Knee;Ankle    Right/Left Hip  Right;Left    Right Hip Flexion  5/5    Right Hip Extension  3-/5    Right Hip External Rotation   5/5    Right Hip Internal Rotation  5/5    Right Hip ABduction  4/5    Left Hip Flexion  5/5    Left Hip Extension  3/5    Left Hip External Rotation  5/5    Left Hip Internal Rotation  5/5    Left Hip ABduction  4/5    Right/Left Knee  Right;Left    Right Knee Flexion  5/5    Right Knee Extension  5/5    Left Knee Flexion  5/5    Left Knee Extension  5/5  Right/Left Ankle   Right;Left    Left Ankle Dorsiflexion  5/5        EXAMINATION   Sensation: slightly diminished sharp/dull but able to report all correctly   Gait Analysis with prosthesis and RW: Pt ambulated 30 ft with RW with dec R knee E, dec weight shift to RLE, dec stance time RLE, dec step length LLE, flexed posture pushing RW too far ahead   Squat pivot transfer: Pt performs squat pivot transfer from transfer chair to mat table at start of session without prosthesis, independently without assist or cues needed.   Sit>stand transfer with prosthesis: Cues for proper hand placement as pt initially placed Bil hands on RW to stand. Weight shift preference to LLE. Pt rises slowly but is steady. Poorly controlled descent to sit.   Knee AROM (in deg) L, R:  F: 136, 98  E: 0, -11   Hip AROM (in deg) L, R:  F: 105, 114  E: 2, -5   Ely's: positive LLE          TREATMENT   Supine R knee press with manual overpressure with 10 second holds x10 (added to HEP). Demonstrated to wife and wife completed the last 5 reps.   Light squeezes to distal RLE in an effort to decrease phantom pain (added to HEP)          Objective measurements completed on examination: See above findings.              PT Education - 03/06/18 1730    Education provided  Yes    Education Details  POC, role of PT, HEP, call made to MD regarding elevated BP (continue taking medication as prescribed by MD)    Person(s) Educated  Patient;Spouse wife    Methods  Explanation;Demonstration;Tactile cues;Verbal cues    Comprehension  Verbalized understanding;Returned demonstration;Verbal cues required;Need further instruction;Tactile cues required       PT Short Term Goals - 03/06/18 1724      PT SHORT TERM GOAL #1   Title  Pt will independently complete HEP at least 4 days/wk for improved carryover between sessions    Time  2    Period  Weeks    Status  New        PT Long Term Goals - 03/06/18 1724       PT LONG TERM GOAL #1   Title  Pt's R knee F and E will improve to WNL for improved gait mechanics    Baseline  F: 98  E: -11     Time  6    Period  Weeks    Status  New      PT LONG TERM GOAL #2   Title  Pt will be able to stand for 1 hour to demonstrate improved endurance in standing with goal of eventual return to work    Time  6    Period  Weeks    Status  New      PT LONG TERM GOAL #3   Title  Pt will demonstrate safe technique with sit<>stand transfers 5/5x for improved safety and independence    Baseline  Requires cues (see evaluation note)    Time  3    Period  Weeks    Status  New             Plan - 03/06/18 1720    Clinical Impression Statement  Pt is a 67 y/o M s/p R BKA.  Pt  demonstrates good strength throughout BLEs except for significant weakness with Bil hip E. Pt with significant R knee F and E deficits and pt was instructed in and provided with HEP R knee press exercise with overpressure in an effort to improve R knee E.  Pt educated in and added to HEP, desensitization light squeezes to distal RLE in an effort to decrease phantom pain.  Evaluation limited due to elevated BP following strength and ROM assessment.  Will perform 46mWT, 5xSTS, and TUG next visit as appropriate.   Called Dr. Joan Flores office again today to relay BP readings and a voicemail was left with Dr. Joan Flores nurse Caryl Pina). Still awaiting call back.  Pt will benefit from skilled PT interventions for improved gait mechanics and improved ROM and strength.      History and Personal Factors relevant to plan of care:  (+) pt very motivated and very active ambulating with RW prior to evaluation, supportive wife and family  (-) consistently elevated BP    Clinical Presentation  Stable    Clinical Presentation due to:  Pt's presentation is stable as pt is healing well and as expected s/p R BKA.      Clinical Decision Making  Low    Rehab Potential  Good    PT Frequency  2x / week    PT Duration  6  weeks    PT Treatment/Interventions  ADLs/Self Care Home Management;Aquatic Therapy;Biofeedback;Cryotherapy;Electrical Stimulation;Iontophoresis 4mg /ml Dexamethasone;Moist Heat;Ultrasound;Contrast Bath;DME Instruction;Stair training;Gait training;Functional mobility training;Therapeutic activities;Therapeutic exercise;Balance training;Neuromuscular re-education;Patient/family education;Prosthetic Training;Manual techniques;Wheelchair Investment banker, operational;Compression bandaging;Passive range of motion;Dry needling;Energy conservation;Taping    PT Next Visit Plan  address night pain/positioning, 62mWT, 5xSTS, TUG, ABC scale, gait training, exercises to improve R knee AROM     PT Home Exercise Plan  R knee presses with overpressure from wife, densistization light squeezes to distal LLE    Recommended Other Services  call made to MD office, see notes above    Consulted and Agree with Plan of Care  Patient;Family member/caregiver    Family Member Consulted  wife       Patient will benefit from skilled therapeutic intervention in order to improve the following deficits and impairments:  Abnormal gait, Decreased activity tolerance, Decreased balance, Decreased endurance, Decreased coordination, Decreased knowledge of use of DME, Decreased mobility, Decreased range of motion, Decreased safety awareness, Decreased strength, Difficulty walking, Hypomobility, Increased fascial restricitons, Increased muscle spasms, Impaired perceived functional ability, Impaired flexibility, Impaired sensation, Improper body mechanics, Postural dysfunction, Prosthetic Dependency, Pain  Visit Diagnosis: Other abnormalities of gait and mobility  Unsteadiness on feet  Muscle weakness (generalized)     Problem List Patient Active Problem List   Diagnosis Date Noted  . Peripheral vascular disease ([place]) 10/22/2017  . Hx of BKA, right ([place]) 09/30/2017  . Gangrene from atherosclerosis, extremities ([place])  08/25/2017  . Ischemic leg 08/15/2017  . HLD (hyperlipidemia) 07/16/2017  . Hypertension 06/28/2017  . Tobacco use disorder 06/28/2017  . Atherosclerosis of native arteries of the extremities with ulceration ([place]) 06/28/2017    Collie Siad PT, DPT 03/06/2018, 5:33 PM  [place] [employer] MAIN [facility] SERVICES [address] [place], [place], [address] Phone: [phone]   Fax:  [phone]  Name: HIKARU DELORENZO MRN: [id number] Date of Birth: [birth year]

## 2018-03-13 ENCOUNTER — Ambulatory Visit: Payer: Medicare Other

## 2018-03-13 VITALS — BP 199/77 | HR 69

## 2018-03-13 DIAGNOSIS — R2689 Other abnormalities of gait and mobility: Secondary | ICD-10-CM | POA: Diagnosis not present

## 2018-03-13 DIAGNOSIS — M6281 Muscle weakness (generalized): Secondary | ICD-10-CM | POA: Diagnosis not present

## 2018-03-13 DIAGNOSIS — R2681 Unsteadiness on feet: Secondary | ICD-10-CM

## 2018-03-13 NOTE — Therapy (Signed)
[place] MAIN [facility] SERVICES [address] [place], [place], [address] Phone: [phone]   Fax:  [phone]  Physical Therapy Treatment  Patient Details  Name: Jordan Mills MRN: [id number] Date of Birth: 1951-04-27 Referring Provider: Sela Hua, PA-C   Encounter Date: 03/13/2018  PT End of Session - 03/13/18 1221    Visit Number  2    Number of Visits  13    Date for PT Re-Evaluation  04/17/18    Authorization Type  2/10 progress note    PT Start Time  1015    PT Stop Time  1100    PT Time Calculation (min)  45 min    Equipment Utilized During Treatment  Gait belt    Activity Tolerance  Patient tolerated treatment well;Treatment limited secondary to medical complications (Comment) elevated BP    Behavior During Therapy  [facility] for tasks assessed/performed       Past Medical History:  Diagnosis Date  . Gout   . HLD (hyperlipidemia)   . Hypertension   . Peripheral vascular disease [facility])     Past Surgical History:  Procedure Laterality Date  . AMPUTATION Right 08/28/2017   Procedure: AMPUTATION BELOW KNEE;  Surgeon: Algernon Huxley, MD;  Location: [facility] ORS;  Service: Vascular;  Laterality: Right;  . AMPUTATION TOE Right 07/19/2017   Procedure: AMPUTATION TOE/Rt 4th MPJ;  Surgeon: Sharlotte Alamo, DPM;  Location: [facility] ORS;  Service: Podiatry;  Laterality: Right;  . LOWER EXTREMITY ANGIOGRAPHY Right 07/01/2017   Procedure: Lower Extremity Angiography;  Surgeon: Algernon Huxley, MD;  Location: [place] CV LAB;  Service: Cardiovascular;  Laterality: Right;  . LOWER EXTREMITY ANGIOGRAPHY Right 08/15/2017   Procedure: Lower Extremity Angiography;  Surgeon: Algernon Huxley, MD;  Location: [place] CV LAB;  Service: Cardiovascular;  Laterality: Right;  . LOWER EXTREMITY ANGIOGRAPHY Right 08/16/2017   Procedure: LOWER EXTREMITY ANGIOGRAPHY;  Surgeon: Algernon Huxley, MD;  Location: [place] CV LAB;  Service: Cardiovascular;   Laterality: Right;  . LOWER EXTREMITY INTERVENTION  08/15/2017   Procedure: LOWER EXTREMITY INTERVENTION;  Surgeon: Algernon Huxley, MD;  Location: [place] CV LAB;  Service: Cardiovascular;;  . TONSILLECTOMY      Vitals:   03/13/18 1019  BP: (!) 199/77  Pulse: 69    Subjective Assessment - 03/13/18 1025    Subjective  Patient took BP meds at 8: 15 this morning. Understands that heneeds to make an appt with doctor, reports doctor never called him back after last visit.     Patient is accompained by:  Family member wife and great grandson    Pertinent History  Pt presented yesterday to this PT clinic for evaluation but was unable to be seen due to elevated BP. See note. Pt took his BP medication this morning and will take again as scheduled at 3pm, per pt. Pt is a 67 y/o M s/p R BKA 08/28/17 who presents to therapy for gait training with prosthesis. Pt is wearing his shrinker 24/7. Pt reports he received his prosthesis 2 weeks ago and was told not to use it until he started PT. Pt has not received PT since his BKA. Pt reports he has phantom pain at night but not during the day, pain as high as 9/10. Pt has been ambulating up to 100 yards with RW. Pt reports 1 fall shortly after R BKA but no falls since. Pt is independent with dressing, bathing (gets into tub to take  a bath), driving. Goal: to be able to walk with prosthesis. Pt denies any issues getting up his steps, hops up one step at a time.     Limitations  Walking;Standing    How long can you walk comfortably?  100 yards with RW    Patient Stated Goals  to ambulate with prosthesis and return to work    Currently in Pain?  No/denies      BP 163/80 after second test 68 189/79 pulse 65  10MWT: 15.84 seconds with RW  5x STS: 15.18 seconds with BUE support; 18.2 seconds without UE support, decreased WB on RLE  TUG: 25.21 seconds with RW and CGA   Ambulate 40ft x3 trials; cues for keeping chest up, weight shift, and staying within  walker  Weight shift L and R hip ; knee bend hip jut; 2 minutes in // bars  High knee marching in // bars 10x each leg BUE support ; difficulty straightening RLE in stance phase  Seated hamstring stretch 2x60 seconds .   Standing side stepping in // bars 2x length of bars.      Pt. response to medical necessity:   Pt will benefit from skilled PT interventions for improved gait mechanics and improved ROM and strength                       PT Education - 03/13/18 1220    Education provided  Yes    Education Details  increase wear (not walking) of prosthesis by 1 hour each day, make appt with MD for BP , weight acceptance, ambulation     Person(s) Educated  Patient;Spouse    Methods  Explanation;Demonstration;Tactile cues;Verbal cues    Comprehension  Verbalized understanding;Returned demonstration;Verbal cues required;Need further instruction       PT Short Term Goals - 03/13/18 1224      PT SHORT TERM GOAL #1   Title  Pt will independently complete HEP at least 4 days/wk for improved carryover between sessions    Time  2    Period  Weeks    Status  New      PT SHORT TERM GOAL #2   Title  Patient (> 69 years old) will complete five times sit to stand test in < 15 seconds indicating an increased LE strength and improved balance.    Baseline  5/30: 18 seconds    Time  2    Period  Weeks    Status  New    Target Date  03/27/18        PT Long Term Goals - 03/13/18 1224      PT LONG TERM GOAL #1   Title  Pt's R knee F and E will improve to WNL for improved gait mechanics    Baseline  F: 98  E: -11     Time  6    Period  Weeks    Status  New      PT LONG TERM GOAL #2   Title  Pt will be able to stand for 1 hour to demonstrate improved endurance in standing with goal of eventual return to work    Time  6    Period  Weeks    Status  New      PT LONG TERM GOAL #3   Title  Pt will demonstrate safe technique with sit<>stand transfers 5/5x for  improved safety and independence    Baseline  Requires cues (see evaluation note)  Time  3    Period  Weeks    Status  New      PT LONG TERM GOAL #4   Title  Patient will increase 10 meter walk test to >1.79m/s as to improve gait speed for better community ambulation and to reduce fall risk    Baseline  5/30=.63 m/s with RW     Time  6    Period  Weeks    Status  New    Target Date  04/24/18      PT LONG TERM GOAL #5   Title  Patient will reduce timed up and go to <11 seconds to reduce fall risk and demonstrate improved transfer/gait ability.    Baseline  5/30: 25.21 seconds    Time  6    Period  Weeks    Status  New    Target Date  04/24/18            Plan - 03/13/18 1223    Clinical Impression Statement  Patient demonstrates limited R knee extension resulting in poor weight acceptance requiring UE support.  Patient and wife understand need to make appointment with MD for BP. Poor weight acceptance due to body mechanics and fear based mindset was focused on today with patient requiring verbal and tactile cueing.  Pt will benefit from skilled PT interventions for improved gait mechanics and improved ROM and strength    Rehab Potential  Good    PT Frequency  2x / week    PT Duration  6 weeks    PT Treatment/Interventions  ADLs/Self Care Home Management;Aquatic Therapy;Biofeedback;Cryotherapy;Electrical Stimulation;Iontophoresis 4mg /ml Dexamethasone;Moist Heat;Ultrasound;Contrast Bath;DME Instruction;Stair training;Gait training;Functional mobility training;Therapeutic activities;Therapeutic exercise;Balance training;Neuromuscular re-education;Patient/family education;Prosthetic Training;Manual techniques;Wheelchair Investment banker, operational;Compression bandaging;Passive range of motion;Dry needling;Energy conservation;Taping    PT Next Visit Plan  address night pain/positioning, 38mWT, 5xSTS, TUG, ABC scale, gait training, exercises to improve R knee AROM     PT Home  Exercise Plan  R knee presses with overpressure from wife, densistization light squeezes to distal LLE    Consulted and Agree with Plan of Care  Patient;Family member/caregiver    Family Member Consulted  wife       Patient will benefit from skilled therapeutic intervention in order to improve the following deficits and impairments:  Abnormal gait, Decreased activity tolerance, Decreased balance, Decreased endurance, Decreased coordination, Decreased knowledge of use of DME, Decreased mobility, Decreased range of motion, Decreased safety awareness, Decreased strength, Difficulty walking, Hypomobility, Increased fascial restricitons, Increased muscle spasms, Impaired perceived functional ability, Impaired flexibility, Impaired sensation, Improper body mechanics, Postural dysfunction, Prosthetic Dependency, Pain  Visit Diagnosis: Other abnormalities of gait and mobility  Unsteadiness on feet  Muscle weakness (generalized)     Problem List Patient Active Problem List   Diagnosis Date Noted  . Peripheral vascular disease ([place]) 10/22/2017  . Hx of BKA, right ([place]) 09/30/2017  . Gangrene from atherosclerosis, extremities ([place]) 08/25/2017  . Ischemic leg 08/15/2017  . HLD (hyperlipidemia) 07/16/2017  . Hypertension 06/28/2017  . Tobacco use disorder 06/28/2017  . Atherosclerosis of native arteries of the extremities with ulceration ([place]) 06/28/2017   Janna Arch, PT, DPT   03/13/2018, 12:27 PM  [place] MAIN [facility] SERVICES [address] [place], [place], [address] Phone: [phone]   Fax:  [phone]  Name: Jordan Mills MRN: [id number] Date of Birth: 1951/06/14

## 2018-03-17 ENCOUNTER — Encounter: Payer: Self-pay | Admitting: Physical Therapy

## 2018-03-17 ENCOUNTER — Ambulatory Visit: Payer: Medicare Other | Attending: Vascular Surgery | Admitting: Physical Therapy

## 2018-03-17 VITALS — BP 139/75 | HR 64

## 2018-03-17 DIAGNOSIS — R2689 Other abnormalities of gait and mobility: Secondary | ICD-10-CM | POA: Insufficient documentation

## 2018-03-17 DIAGNOSIS — M6281 Muscle weakness (generalized): Secondary | ICD-10-CM | POA: Insufficient documentation

## 2018-03-17 DIAGNOSIS — F17200 Nicotine dependence, unspecified, uncomplicated: Secondary | ICD-10-CM | POA: Diagnosis not present

## 2018-03-17 DIAGNOSIS — I119 Hypertensive heart disease without heart failure: Secondary | ICD-10-CM | POA: Diagnosis not present

## 2018-03-17 DIAGNOSIS — Z789 Other specified health status: Secondary | ICD-10-CM | POA: Diagnosis not present

## 2018-03-17 DIAGNOSIS — J449 Chronic obstructive pulmonary disease, unspecified: Secondary | ICD-10-CM | POA: Diagnosis not present

## 2018-03-17 DIAGNOSIS — R2681 Unsteadiness on feet: Secondary | ICD-10-CM | POA: Insufficient documentation

## 2018-03-17 NOTE — Therapy (Signed)
[place] MAIN [facility] SERVICES [address] [place], [place], [address] Phone: [phone]   Fax:  [phone]  Physical Therapy Treatment  Patient Details  Name: Jordan Mills MRN: [id number] Date of Birth: [birth year] Referring Provider: Sela Hua, PA-C   Encounter Date: 03/17/2018  PT End of Session - 03/17/18 1301    Visit Number  3    Number of Visits  13    Date for PT Re-Evaluation  04/17/18    Authorization Type  3/10 progress note    PT Start Time  1300    PT Stop Time  1344    PT Time Calculation (min)  44 min    Equipment Utilized During Treatment  Gait belt    Activity Tolerance  Patient tolerated treatment well;Treatment limited secondary to medical complications (Comment) elevated BP    Behavior During Therapy  WFL for tasks assessed/performed       Past Medical History:  Diagnosis Date  . Gout   . HLD (hyperlipidemia)   . Hypertension   . Peripheral vascular disease [facility])     Past Surgical History:  Procedure Laterality Date  . AMPUTATION Right 08/28/2017   Procedure: AMPUTATION BELOW KNEE;  Surgeon: Algernon Huxley, MD;  Location: [facility] ORS;  Service: Vascular;  Laterality: Right;  . AMPUTATION TOE Right 07/19/2017   Procedure: AMPUTATION TOE/Rt 4th MPJ;  Surgeon: Sharlotte Alamo, DPM;  Location: [facility] ORS;  Service: Podiatry;  Laterality: Right;  . LOWER EXTREMITY ANGIOGRAPHY Right 07/01/2017   Procedure: Lower Extremity Angiography;  Surgeon: Algernon Huxley, MD;  Location: [place] CV LAB;  Service: Cardiovascular;  Laterality: Right;  . LOWER EXTREMITY ANGIOGRAPHY Right 08/15/2017   Procedure: Lower Extremity Angiography;  Surgeon: Algernon Huxley, MD;  Location: [place] CV LAB;  Service: Cardiovascular;  Laterality: Right;  . LOWER EXTREMITY ANGIOGRAPHY Right 08/16/2017   Procedure: LOWER EXTREMITY ANGIOGRAPHY;  Surgeon: Algernon Huxley, MD;  Location: [place] CV LAB;  Service: Cardiovascular;   Laterality: Right;  . LOWER EXTREMITY INTERVENTION  08/15/2017   Procedure: LOWER EXTREMITY INTERVENTION;  Surgeon: Algernon Huxley, MD;  Location: [place] CV LAB;  Service: Cardiovascular;;  . TONSILLECTOMY      Vitals:   03/17/18 1311  BP: 139/75  Pulse: 64  SpO2: 100%    Subjective Assessment - 03/17/18 1307    Subjective  Pt reports that he has an appointment with his PCP Dr. Dwyane Luo today at 3pm to addres his elevated BP.  Pt reports he has not been ambulating with his prosthesis but has been "sitting around with it on", says he was not aware he was supposed to walk with it on.      Patient is accompained by:  Family member wife and great grandson    Pertinent History  Pt presented yesterday to this PT clinic for evaluation but was unable to be seen due to elevated BP. See note. Pt took his BP medication this morning and will take again as scheduled at 3pm, per pt. Pt is a 67 y/o M s/p R BKA 08/28/17 who presents to therapy for gait training with prosthesis. Pt is wearing his shrinker 24/7. Pt reports he received his prosthesis 2 weeks ago and was told not to use it until he started PT. Pt has not received PT since his BKA. Pt reports he has phantom pain at night but not during the day, pain as high as 9/10. Pt has been ambulating up  to 100 yards with RW. Pt reports 1 fall shortly after R BKA but no falls since. Pt is independent with dressing, bathing (gets into tub to take a bath), driving. Goal: to be able to walk with prosthesis. Pt denies any issues getting up his steps, hops up one step at a time.     Limitations  Walking;Standing    How long can you walk comfortably?  100 yards with RW    Patient Stated Goals  to ambulate with prosthesis and return to work    Currently in Pain?  No/denies         TREATMENT   Vitals taken at start of session with pt in sitting: 139/75, SpO2 100%, pulse 64  L and R weight shifting with verbal and tactile cues for complete weight shift to RLE.  Discontinued after 15 reps as pt reports pain at medial R knee (not at pressure site for prosthesis)  Staggered stance with RLE forward and forward weight shifting on RLE. x30. Cues for upright posture as pt initially demonstrates flexed posture when shifting forward.  LAQ RLE with prosthesis on with 5 second holds x10.  SLS RLE 3x30 seconds with mirror feedback and tactile facilitation to weight shift to RLE  Ambulating in gym with cues for upright posture, heel strike RLE. 3x100 ft  Ambulating in hallway with horizontal head turns reading out cards on wall 1x100 ft                        PT Education - 03/17/18 1300    Education provided  Yes    Education Details  Exercise technique    Person(s) Educated  Patient    Methods  Explanation;Demonstration;Verbal cues    Comprehension  Verbalized understanding;Returned demonstration;Verbal cues required;Need further instruction       PT Short Term Goals - 03/13/18 1224      PT SHORT TERM GOAL #1   Title  Pt will independently complete HEP at least 4 days/wk for improved carryover between sessions    Time  2    Period  Weeks    Status  New      PT SHORT TERM GOAL #2   Title  Patient (> 67 years old) will complete five times sit to stand test in < 15 seconds indicating an increased LE strength and improved balance.    Baseline  5/30: 18 seconds    Time  2    Period  Weeks    Status  New    Target Date  03/27/18        PT Long Term Goals - 03/13/18 1224      PT LONG TERM GOAL #1   Title  Pt's R knee F and E will improve to WNL for improved gait mechanics    Baseline  F: 98  E: -11     Time  6    Period  Weeks    Status  New      PT LONG TERM GOAL #2   Title  Pt will be able to stand for 1 hour to demonstrate improved endurance in standing with goal of eventual return to work    Time  6    Period  Weeks    Status  New      PT LONG TERM GOAL #3   Title  Pt will demonstrate safe technique with sit<>stand  transfers 5/5x for improved safety and independence    Baseline  Requires cues (see evaluation note)    Time  3    Period  Weeks    Status  New      PT LONG TERM GOAL #4   Title  Patient will increase 10 meter walk test to >1.76m/s as to improve gait speed for better community ambulation and to reduce fall risk    Baseline  5/30=.63 m/s with RW     Time  6    Period  Weeks    Status  New    Target Date  04/24/18      PT LONG TERM GOAL #5   Title  Patient will reduce timed up and go to <11 seconds to reduce fall risk and demonstrate improved transfer/gait ability.    Baseline  5/30: 25.21 seconds    Time  6    Period  Weeks    Status  New    Target Date  04/24/18            Plan - 03/17/18 1310    Clinical Impression Statement  Encouraged R heel strike when ambulating in gym this session to promote greater R knee E and safety.  Pt feels a strech in hamstring with this.  Introduced SLS on RLE with pt demonstrating fatigue and requiring BUE assist.  Had pt practice weight shifting activities in standing which was then encouraged while ambulating in gym.  Pt fatigues with LAQ activity.  Pt will benefit from continued skilled PT interventions for improved strength and gait mechanics.      Rehab Potential  Good    PT Frequency  2x / week    PT Duration  6 weeks    PT Treatment/Interventions  ADLs/Self Care Home Management;Aquatic Therapy;Biofeedback;Cryotherapy;Electrical Stimulation;Iontophoresis 4mg /ml Dexamethasone;Moist Heat;Ultrasound;Contrast Bath;DME Instruction;Stair training;Gait training;Functional mobility training;Therapeutic activities;Therapeutic exercise;Balance training;Neuromuscular re-education;Patient/family education;Prosthetic Training;Manual techniques;Wheelchair Investment banker, operational;Compression bandaging;Passive range of motion;Dry needling;Energy conservation;Taping    PT Next Visit Plan  address night pain/positioning, 55mWT, 5xSTS, TUG, ABC scale,  gait training, exercises to improve R knee AROM     PT Home Exercise Plan  R knee presses with overpressure from wife, densistization light squeezes to distal LLE, LAQ, ambulating in home    Consulted and Agree with Plan of Care  Patient;Family member/caregiver    Family Member Consulted  wife       Patient will benefit from skilled therapeutic intervention in order to improve the following deficits and impairments:  Abnormal gait, Decreased activity tolerance, Decreased balance, Decreased endurance, Decreased coordination, Decreased knowledge of use of DME, Decreased mobility, Decreased range of motion, Decreased safety awareness, Decreased strength, Difficulty walking, Hypomobility, Increased fascial restricitons, Increased muscle spasms, Impaired perceived functional ability, Impaired flexibility, Impaired sensation, Improper body mechanics, Postural dysfunction, Prosthetic Dependency, Pain  Visit Diagnosis: Other abnormalities of gait and mobility  Unsteadiness on feet  Muscle weakness (generalized)     Problem List Patient Active Problem List   Diagnosis Date Noted  . Peripheral vascular disease ([place]) 10/22/2017  . Hx of BKA, right ([place]) 09/30/2017  . Gangrene from atherosclerosis, extremities ([place]) 08/25/2017  . Ischemic leg 08/15/2017  . HLD (hyperlipidemia) 07/16/2017  . Hypertension 06/28/2017  . Tobacco use disorder 06/28/2017  . Atherosclerosis of native arteries of the extremities with ulceration ([place]) 06/28/2017    Collie Siad PT, DPT 03/17/2018, 1:45 PM  [place] MAIN [facility] SERVICES [address] [place], [place], [address] Phone: [phone]   Fax:  [phone]  Name: Jordan Mills MRN:  [id number] Date of Birth: November 09, 1950

## 2018-03-19 ENCOUNTER — Ambulatory Visit: Payer: Medicare Other

## 2018-03-19 VITALS — BP 175/76 | HR 72

## 2018-03-19 DIAGNOSIS — M6281 Muscle weakness (generalized): Secondary | ICD-10-CM | POA: Diagnosis not present

## 2018-03-19 DIAGNOSIS — R2681 Unsteadiness on feet: Secondary | ICD-10-CM

## 2018-03-19 DIAGNOSIS — R2689 Other abnormalities of gait and mobility: Secondary | ICD-10-CM | POA: Diagnosis not present

## 2018-03-19 NOTE — Therapy (Signed)
[place] MAIN [facility] SERVICES [address] [place], [place], [address] Phone: [phone]   Fax:  [phone]  Physical Therapy Treatment  Patient Details  Name: Jordan Mills MRN: [id number] Date of Birth: May 28, 1951 Referring Provider: Sela Hua, PA-C   Encounter Date: 03/19/2018  PT End of Session - 03/19/18 1027    Visit Number  4    Number of Visits  13    Date for PT Re-Evaluation  04/17/18    Authorization Type  4/10 progress note    PT Start Time  1017    PT Stop Time  1100    PT Time Calculation (min)  43 min    Equipment Utilized During Treatment  Gait belt    Activity Tolerance  Patient tolerated treatment well;Treatment limited secondary to medical complications (Comment) elevated BP    Behavior During Therapy  WFL for tasks assessed/performed       Past Medical History:  Diagnosis Date  . Gout   . HLD (hyperlipidemia)   . Hypertension   . Peripheral vascular disease [facility])     Past Surgical History:  Procedure Laterality Date  . AMPUTATION Right 08/28/2017   Procedure: AMPUTATION BELOW KNEE;  Surgeon: Algernon Huxley, MD;  Location: [facility] ORS;  Service: Vascular;  Laterality: Right;  . AMPUTATION TOE Right 07/19/2017   Procedure: AMPUTATION TOE/Rt 4th MPJ;  Surgeon: Sharlotte Alamo, DPM;  Location: [facility] ORS;  Service: Podiatry;  Laterality: Right;  . LOWER EXTREMITY ANGIOGRAPHY Right 07/01/2017   Procedure: Lower Extremity Angiography;  Surgeon: Algernon Huxley, MD;  Location: [place] CV LAB;  Service: Cardiovascular;  Laterality: Right;  . LOWER EXTREMITY ANGIOGRAPHY Right 08/15/2017   Procedure: Lower Extremity Angiography;  Surgeon: Algernon Huxley, MD;  Location: [place] CV LAB;  Service: Cardiovascular;  Laterality: Right;  . LOWER EXTREMITY ANGIOGRAPHY Right 08/16/2017   Procedure: LOWER EXTREMITY ANGIOGRAPHY;  Surgeon: Algernon Huxley, MD;  Location: [place] CV LAB;  Service: Cardiovascular;   Laterality: Right;  . LOWER EXTREMITY INTERVENTION  08/15/2017   Procedure: LOWER EXTREMITY INTERVENTION;  Surgeon: Algernon Huxley, MD;  Location: [place] CV LAB;  Service: Cardiovascular;;  . TONSILLECTOMY      Vitals:   03/19/18 1025  BP: (!) 175/76  Pulse: 72    Subjective Assessment - 03/19/18 1024    Subjective  Patient reports has pain in residual limb at night time, with pain 8/10. Patient had a change of blood pressure pills. Went to the bathroom right before session so BP is raised.     Patient is accompained by:  Family member wife and great grandson    Pertinent History  Pt presented yesterday to this PT clinic for evaluation but was unable to be seen due to elevated BP. See note. Pt took his BP medication this morning and will take again as scheduled at 3pm, per pt. Pt is a 67 y/o M s/p R BKA 08/28/17 who presents to therapy for gait training with prosthesis. Pt is wearing his shrinker 24/7. Pt reports he received his prosthesis 2 weeks ago and was told not to use it until he started PT. Pt has not received PT since his BKA. Pt reports he has phantom pain at night but not during the day, pain as high as 9/10. Pt has been ambulating up to 100 yards with RW. Pt reports 1 fall shortly after R BKA but no falls since. Pt is independent with dressing, bathing (  gets into tub to take a bath), driving. Goal: to be able to walk with prosthesis. Pt denies any issues getting up his steps, hops up one step at a time.     Limitations  Walking;Standing    How long can you walk comfortably?  100 yards with RW    Patient Stated Goals  to ambulate with prosthesis and return to work    Currently in Pain?  No/denies       Weight shift L and R hip ; knee bend hip jut; 2 minutes in // bars   High knee marching in // bars 10x each leg BUE support ; difficulty straightening RLE in stance phase   Seated hamstring stretch 3x60 seconds .    Standing side stepping in // bars 4x length of bars.    Ambulating backwards in // bars to promote hip extension 4x length of bars.     Ambulating in hallway with RW with cues for upright posture, heel strike RLE. 2x100 ft   Ambulating in hallway with horizontal head turns reading out cards on wall 2x100 ft ; RW with CGA   Ambulate around 3 cones in a row to practice turning corners and negotiating with walker, CGAx 4 trials ; cues for not scissoring legs.   Seated knee extension RLE with PT providing distractive and AP force 2x60 seconds   Pt. response to medical necessity:    Pt will benefit from skilled PT interventions for improved gait mechanics and improved ROM and strength                         PT Education - 03/19/18 1026    Education provided  Yes    Education Details  exercise technique, ambulation, weigth shift    Person(s) Educated  Patient    Methods  Explanation;Demonstration;Verbal cues;Tactile cues    Comprehension  Verbalized understanding;Returned demonstration;Verbal cues required;Need further instruction       PT Short Term Goals - 03/13/18 1224      PT SHORT TERM GOAL #1   Title  Pt will independently complete HEP at least 4 days/wk for improved carryover between sessions    Time  2    Period  Weeks    Status  New      PT SHORT TERM GOAL #2   Title  Patient (> 75 years old) will complete five times sit to stand test in < 15 seconds indicating an increased LE strength and improved balance.    Baseline  5/30: 18 seconds    Time  2    Period  Weeks    Status  New    Target Date  03/27/18        PT Long Term Goals - 03/13/18 1224      PT LONG TERM GOAL #1   Title  Pt's R knee F and E will improve to WNL for improved gait mechanics    Baseline  F: 98  E: -11     Time  6    Period  Weeks    Status  New      PT LONG TERM GOAL #2   Title  Pt will be able to stand for 1 hour to demonstrate improved endurance in standing with goal of eventual return to work    Time  6    Period   Weeks    Status  New      PT LONG TERM GOAL #  3   Title  Pt will demonstrate safe technique with sit<>stand transfers 5/5x for improved safety and independence    Baseline  Requires cues (see evaluation note)    Time  3    Period  Weeks    Status  New      PT LONG TERM GOAL #4   Title  Patient will increase 10 meter walk test to >1.2m/s as to improve gait speed for better community ambulation and to reduce fall risk    Baseline  5/30=.63 m/s with RW     Time  6    Period  Weeks    Status  New    Target Date  04/24/18      PT LONG TERM GOAL #5   Title  Patient will reduce timed up and go to <11 seconds to reduce fall risk and demonstrate improved transfer/gait ability.    Baseline  5/30: 25.21 seconds    Time  6    Period  Weeks    Status  New    Target Date  04/24/18            Plan - 03/19/18 1050    Clinical Impression Statement  Patient challenged with weight acceptance onto RLE, requiring excessive upper extremity support. Side stepping in // bars is challenging to patient due to weight acceptance demands of task. Limited hip and knee extension is noted in ambulation with slight knee bend noted. Patient demonstrated improved ability to  Negotiate obstacles such as cones and turning without LOB. Pt will benefit from skilled PT interventions for improved gait mechanics and improved ROM and strength    Rehab Potential  Good    PT Frequency  2x / week    PT Duration  6 weeks    PT Treatment/Interventions  ADLs/Self Care Home Management;Aquatic Therapy;Biofeedback;Cryotherapy;Electrical Stimulation;Iontophoresis 4mg /ml Dexamethasone;Moist Heat;Ultrasound;Contrast Bath;DME Instruction;Stair training;Gait training;Functional mobility training;Therapeutic activities;Therapeutic exercise;Balance training;Neuromuscular re-education;Patient/family education;Prosthetic Training;Manual techniques;Wheelchair Investment banker, operational;Compression bandaging;Passive range of  motion;Dry needling;Energy conservation;Taping    PT Next Visit Plan  address night pain/positioning, 64mWT, 5xSTS, TUG, ABC scale, gait training, exercises to improve R knee AROM     PT Home Exercise Plan  R knee presses with overpressure from wife, densistization light squeezes to distal LLE, LAQ, ambulating in home    Consulted and Agree with Plan of Care  Patient;Family member/caregiver    Family Member Consulted  wife       Patient will benefit from skilled therapeutic intervention in order to improve the following deficits and impairments:  Abnormal gait, Decreased activity tolerance, Decreased balance, Decreased endurance, Decreased coordination, Decreased knowledge of use of DME, Decreased mobility, Decreased range of motion, Decreased safety awareness, Decreased strength, Difficulty walking, Hypomobility, Increased fascial restricitons, Increased muscle spasms, Impaired perceived functional ability, Impaired flexibility, Impaired sensation, Improper body mechanics, Postural dysfunction, Prosthetic Dependency, Pain  Visit Diagnosis: Other abnormalities of gait and mobility  Unsteadiness on feet  Muscle weakness (generalized)     Problem List Patient Active Problem List   Diagnosis Date Noted  . Peripheral vascular disease ([place]) 10/22/2017  . Hx of BKA, right ([place]) 09/30/2017  . Gangrene from atherosclerosis, extremities ([place]) 08/25/2017  . Ischemic leg 08/15/2017  . HLD (hyperlipidemia) 07/16/2017  . Hypertension 06/28/2017  . Tobacco use disorder 06/28/2017  . Atherosclerosis of native arteries of the extremities with ulceration ([place]) 06/28/2017   Janna Arch, PT, DPT   03/19/2018, 11:01 AM  [place] MAIN [facility] SERVICES [address]  [place], [place], [address] Phone: [phone]   Fax:  [phone]  Name: TRASK VOSLER MRN: [id number] Date of Birth: 1950/11/19

## 2018-03-24 ENCOUNTER — Encounter: Payer: Self-pay | Admitting: Physical Therapy

## 2018-03-24 ENCOUNTER — Ambulatory Visit: Payer: Medicare Other | Admitting: Physical Therapy

## 2018-03-24 DIAGNOSIS — R2681 Unsteadiness on feet: Secondary | ICD-10-CM | POA: Diagnosis not present

## 2018-03-24 DIAGNOSIS — R2689 Other abnormalities of gait and mobility: Secondary | ICD-10-CM

## 2018-03-24 DIAGNOSIS — M6281 Muscle weakness (generalized): Secondary | ICD-10-CM | POA: Diagnosis not present

## 2018-03-24 NOTE — Therapy (Signed)
[place] MAIN [facility] SERVICES [address] [place], [place], [address] Phone: [phone]   Fax:  [phone]  Physical Therapy Treatment  Patient Details  Name: Jordan Mills MRN: [id number] Date of Birth: July 06, 1951 Referring Provider: Sela Hua, PA-C   Encounter Date: 03/24/2018  PT End of Session - 03/24/18 1450    Visit Number  5    Number of Visits  13    Date for PT Re-Evaluation  04/17/18    Authorization Type  5/10 progress note    PT Start Time  1450    PT Stop Time  1538    PT Time Calculation (min)  48 min    Equipment Utilized During Treatment  Gait belt    Activity Tolerance  Patient tolerated treatment well;Treatment limited secondary to medical complications (Comment) elevated BP    Behavior During Therapy  WFL for tasks assessed/performed       Past Medical History:  Diagnosis Date  . Gout   . HLD (hyperlipidemia)   . Hypertension   . Peripheral vascular disease [facility])     Past Surgical History:  Procedure Laterality Date  . AMPUTATION Right 08/28/2017   Procedure: AMPUTATION BELOW KNEE;  Surgeon: Algernon Huxley, MD;  Location: [facility] ORS;  Service: Vascular;  Laterality: Right;  . AMPUTATION TOE Right 07/19/2017   Procedure: AMPUTATION TOE/Rt 4th MPJ;  Surgeon: Sharlotte Alamo, DPM;  Location: [facility] ORS;  Service: Podiatry;  Laterality: Right;  . LOWER EXTREMITY ANGIOGRAPHY Right 07/01/2017   Procedure: Lower Extremity Angiography;  Surgeon: Algernon Huxley, MD;  Location: [place] CV LAB;  Service: Cardiovascular;  Laterality: Right;  . LOWER EXTREMITY ANGIOGRAPHY Right 08/15/2017   Procedure: Lower Extremity Angiography;  Surgeon: Algernon Huxley, MD;  Location: [place] CV LAB;  Service: Cardiovascular;  Laterality: Right;  . LOWER EXTREMITY ANGIOGRAPHY Right 08/16/2017   Procedure: LOWER EXTREMITY ANGIOGRAPHY;  Surgeon: Algernon Huxley, MD;  Location: [place] CV LAB;  Service: Cardiovascular;   Laterality: Right;  . LOWER EXTREMITY INTERVENTION  08/15/2017   Procedure: LOWER EXTREMITY INTERVENTION;  Surgeon: Algernon Huxley, MD;  Location: [place] CV LAB;  Service: Cardiovascular;;  . TONSILLECTOMY      There were no vitals filed for this visit.  Subjective Assessment - 03/24/18 1454    Subjective  Pt has been ambulating in his home every day except one in the past week with prosthesis.  Pt even tried ambulating on grass with prosthesis and was very careful.  Pt denies any falls.  Pt has an appointment with prosthetist on 6/26 to re-assess fit of prosthesis, pt thinks it is too tight posteriorly and too heavy.      Patient is accompained by:  Family member wife and great grandson    Pertinent History  Pt presented yesterday to this PT clinic for evaluation but was unable to be seen due to elevated BP. See note. Pt took his BP medication this morning and will take again as scheduled at 3pm, per pt. Pt is a 67 y/o M s/p R BKA 08/28/17 who presents to therapy for gait training with prosthesis. Pt is wearing his shrinker 24/7. Pt reports he received his prosthesis 2 weeks ago and was told not to use it until he started PT. Pt has not received PT since his BKA. Pt reports he has phantom pain at night but not during the day, pain as high as 9/10. Pt has been ambulating up to  100 yards with RW. Pt reports 1 fall shortly after R BKA but no falls since. Pt is independent with dressing, bathing (gets into tub to take a bath), driving. Goal: to be able to walk with prosthesis. Pt denies any issues getting up his steps, hops up one step at a time.     Limitations  Walking;Standing    How long can you walk comfortably?  100 yards with RW    Patient Stated Goals  to ambulate with prosthesis and return to work    Currently in Pain?  No/denies       TREATMENT   Vitals taken at start of session with pt in sitting: 167/78, SpO2 100%, pulse 71   Seated hamstring stretch with manual assist from PT  3x60 seconds   R patella grade III-IV lateral joint mobs 3x30 seconds   R grade III-IV AP tibia on femur joint mobs 3x30 seconds   Ambulating in gym with cues for upright posture, heel strike RLE 2x100 ft. Cues for greater R hip extension as pt initially only to neutral (likely due to tight hip flexors and weak hip E, addressed later in session)   High knee marching in // bars 10x each leg BUE support; difficulty straightening RLE in stance phase   Ambulating backwards in // bars to promote hip extension and knee extension 4x length of bars. Cues for upright posture to avoid compensatory trunk flexion.   Prone R hip F stretch with pillow under distal femur x2 minutes   Prone R hip E x20                          PT Education - 03/24/18 1450    Education provided  Yes    Education Details  Exercise technique    Person(s) Educated  Patient    Methods  Explanation;Demonstration;Verbal cues;Handout    Comprehension  Verbalized understanding;Returned demonstration;Verbal cues required;Need further instruction       PT Short Term Goals - 03/13/18 1224      PT SHORT TERM GOAL #1   Title  Pt will independently complete HEP at least 4 days/wk for improved carryover between sessions    Time  2    Period  Weeks    Status  New      PT SHORT TERM GOAL #2   Title  Patient (> 69 years old) will complete five times sit to stand test in < 15 seconds indicating an increased LE strength and improved balance.    Baseline  5/30: 18 seconds    Time  2    Period  Weeks    Status  New    Target Date  03/27/18        PT Long Term Goals - 03/13/18 1224      PT LONG TERM GOAL #1   Title  Pt's R knee F and E will improve to WNL for improved gait mechanics    Baseline  F: 98  E: -11     Time  6    Period  Weeks    Status  New      PT LONG TERM GOAL #2   Title  Pt will be able to stand for 1 hour to demonstrate improved endurance in standing with goal of eventual return  to work    Time  6    Period  Weeks    Status  New      PT  LONG TERM GOAL #3   Title  Pt will demonstrate safe technique with sit<>stand transfers 5/5x for improved safety and independence    Baseline  Requires cues (see evaluation note)    Time  3    Period  Weeks    Status  New      PT LONG TERM GOAL #4   Title  Patient will increase 10 meter walk test to >1.40m/s as to improve gait speed for better community ambulation and to reduce fall risk    Baseline  5/30=.63 m/s with RW     Time  6    Period  Weeks    Status  New    Target Date  04/24/18      PT LONG TERM GOAL #5   Title  Patient will reduce timed up and go to <11 seconds to reduce fall risk and demonstrate improved transfer/gait ability.    Baseline  5/30: 25.21 seconds    Time  6    Period  Weeks    Status  New    Target Date  04/24/18            Plan - 03/24/18 1458    Clinical Impression Statement  Pt continues to demonstrate limited R knee E and thus performed joint mobility to patella and R tibia on femur as well as performing a hamstring stretch in an effort to improve R knee E.  Additionally noted tight R hip flexors and weakness with R hip E thus performed stretching of hip flexors and strengthening of R hip E in prone.  Encouraged pt to complete prone hip F stretch at home as part of HEP.  Pt will benefit from continued skilled PT interventions for improved gait mechanics and independence.      Rehab Potential  Good    PT Frequency  2x / week    PT Duration  6 weeks    PT Treatment/Interventions  ADLs/Self Care Home Management;Aquatic Therapy;Biofeedback;Cryotherapy;Electrical Stimulation;Iontophoresis 4mg /ml Dexamethasone;Moist Heat;Ultrasound;Contrast Bath;DME Instruction;Stair training;Gait training;Functional mobility training;Therapeutic activities;Therapeutic exercise;Balance training;Neuromuscular re-education;Patient/family education;Prosthetic Training;Manual techniques;Wheelchair Product manager;Compression bandaging;Passive range of motion;Dry needling;Energy conservation;Taping    PT Next Visit Plan  address night pain/positioning, 38mWT, 5xSTS, TUG, ABC scale, gait training, exercises to improve R knee AROM     PT Home Exercise Plan  R knee presses with overpressure from wife, densistization light squeezes to distal LLE, LAQ, ambulating in home, prone R hip flexor stretch with pillow x3 minutes    Consulted and Agree with Plan of Care  Patient;Family member/caregiver    Family Member Consulted  wife       Patient will benefit from skilled therapeutic intervention in order to improve the following deficits and impairments:  Abnormal gait, Decreased activity tolerance, Decreased balance, Decreased endurance, Decreased coordination, Decreased knowledge of use of DME, Decreased mobility, Decreased range of motion, Decreased safety awareness, Decreased strength, Difficulty walking, Hypomobility, Increased fascial restricitons, Increased muscle spasms, Impaired perceived functional ability, Impaired flexibility, Impaired sensation, Improper body mechanics, Postural dysfunction, Prosthetic Dependency, Pain  Visit Diagnosis: Other abnormalities of gait and mobility  Unsteadiness on feet  Muscle weakness (generalized)     Problem List Patient Active Problem List   Diagnosis Date Noted  . Peripheral vascular disease ([place]) 10/22/2017  . Hx of BKA, right ([place]) 09/30/2017  . Gangrene from atherosclerosis, extremities ([place]) 08/25/2017  . Ischemic leg 08/15/2017  . HLD (hyperlipidemia) 07/16/2017  . Hypertension 06/28/2017  . Tobacco use disorder 06/28/2017  . Atherosclerosis  of native arteries of the extremities with ulceration ([place]) 06/28/2017    Collie Siad PT, DPT 03/24/2018, 3:54 PM  [place] MAIN [facility] SERVICES [address] [place], [place], [address] Phone: [phone]   Fax:  [phone]  Name: Jordan Mills MRN: [id number] Date of Birth: 05/05/1951

## 2018-03-26 ENCOUNTER — Ambulatory Visit: Payer: Medicare Other

## 2018-03-26 DIAGNOSIS — R2681 Unsteadiness on feet: Secondary | ICD-10-CM | POA: Diagnosis not present

## 2018-03-26 DIAGNOSIS — R2689 Other abnormalities of gait and mobility: Secondary | ICD-10-CM

## 2018-03-26 DIAGNOSIS — M6281 Muscle weakness (generalized): Secondary | ICD-10-CM | POA: Diagnosis not present

## 2018-03-26 NOTE — Therapy (Signed)
[place] MAIN [facility] SERVICES [address] [place], [place], [address] Phone: [phone]   Fax:  [phone]  Physical Therapy Treatment  Patient Details  Name: Jordan Mills MRN: [id number] Date of Birth: 07/19/1951 Referring Provider: Sela Hua, PA-C   Encounter Date: 03/26/2018  PT End of Session - 03/26/18 1020    Visit Number  6    Number of Visits  13    Date for PT Re-Evaluation  04/17/18    Authorization Type  6/10 progress note    PT Start Time  1015    PT Stop Time  1101    PT Time Calculation (min)  46 min    Equipment Utilized During Treatment  Gait belt    Activity Tolerance  Patient tolerated treatment well;Treatment limited secondary to medical complications (Comment) elevated BP    Behavior During Therapy  WFL for tasks assessed/performed       Past Medical History:  Diagnosis Date  . Gout   . HLD (hyperlipidemia)   . Hypertension   . Peripheral vascular disease [facility])     Past Surgical History:  Procedure Laterality Date  . AMPUTATION Right 08/28/2017   Procedure: AMPUTATION BELOW KNEE;  Surgeon: Algernon Huxley, MD;  Location: [facility] ORS;  Service: Vascular;  Laterality: Right;  . AMPUTATION TOE Right 07/19/2017   Procedure: AMPUTATION TOE/Rt 4th MPJ;  Surgeon: Sharlotte Alamo, DPM;  Location: [facility] ORS;  Service: Podiatry;  Laterality: Right;  . LOWER EXTREMITY ANGIOGRAPHY Right 07/01/2017   Procedure: Lower Extremity Angiography;  Surgeon: Algernon Huxley, MD;  Location: [place] CV LAB;  Service: Cardiovascular;  Laterality: Right;  . LOWER EXTREMITY ANGIOGRAPHY Right 08/15/2017   Procedure: Lower Extremity Angiography;  Surgeon: Algernon Huxley, MD;  Location: [place] [facility];  Service: Cardiovascular;  Laterality: Right;  . LOWER EXTREMITY ANGIOGRAPHY Right 08/16/2017   Procedure: LOWER EXTREMITY ANGIOGRAPHY;  Surgeon: Algernon Huxley, MD;  Location: [place] CV LAB;  Service: Cardiovascular;   Laterality: Right;  . LOWER EXTREMITY INTERVENTION  08/15/2017   Procedure: LOWER EXTREMITY INTERVENTION;  Surgeon: Algernon Huxley, MD;  Location: [place] CV LAB;  Service: Cardiovascular;;  . TONSILLECTOMY      There were no vitals filed for this visit.  Subjective Assessment - 03/26/18 1025    Subjective  Patietn reports walking yesterday in house. Is wearing a 3 and 1 sock for fit at this time. No complaints or concerns at this time. Took BP meds this morning.     Patient is accompained by:  Family member wife and great grandson    Pertinent History  Pt presented yesterday to this PT clinic for evaluation but was unable to be seen due to elevated BP. See note. Pt took his BP medication this morning and will take again as scheduled at 3pm, per pt. Pt is a 67 y/o M s/p R BKA 08/28/17 who presents to therapy for gait training with prosthesis. Pt is wearing his shrinker 24/7. Pt reports he received his prosthesis 2 weeks ago and was told not to use it until he started PT. Pt has not received PT since his BKA. Pt reports he has phantom pain at night but not during the day, pain as high as 9/10. Pt has been ambulating up to 100 yards with RW. Pt reports 1 fall shortly after R BKA but no falls since. Pt is independent with dressing, bathing (gets into tub to take a bath), driving.  Goal: to be able to walk with prosthesis. Pt denies any issues getting up his steps, hops up one step at a time.     Limitations  Walking;Standing    How long can you walk comfortably?  100 yards with RW    Patient Stated Goals  to ambulate with prosthesis and return to work    Currently in Pain?  No/denies      180/90 first BP attempt  second trial: 165/75 pulse 62    Ambulate outside over stable and unstable terrain with changing surfaces from cobblestone to brick to cement with RW and CGA. Cues for upright posture and maintaining correct position with RW. One seated break, 88 ft +184 ft.    Side stepping in //  bars with SUE support 6x length of bars  Standing hip extensions in // bars 10x each LE with BUE support   Seated adduction squeezes 10x 3 second holds; 2 sets    Airex pad: static with CGA eyes closed 4x30 seconds   Seated hamstring stretch 2x30 seconds L and R LE.                     PT Education - 03/26/18 1019    Education provided  Yes    Education Details  exercise technique, ambulation    Person(s) Educated  Patient    Methods  Explanation;Demonstration;Verbal cues    Comprehension  Verbalized understanding;Returned demonstration       PT Short Term Goals - 03/13/18 1224      PT SHORT TERM GOAL #1   Title  Pt will independently complete HEP at least 4 days/wk for improved carryover between sessions    Time  2    Period  Weeks    Status  New      PT SHORT TERM GOAL #2   Title  Patient (> 69 years old) will complete five times sit to stand test in < 15 seconds indicating an increased LE strength and improved balance.    Baseline  5/30: 18 seconds    Time  2    Period  Weeks    Status  New    Target Date  03/27/18        PT Long Term Goals - 03/13/18 1224      PT LONG TERM GOAL #1   Title  Pt's R knee F and E will improve to WNL for improved gait mechanics    Baseline  F: 98  E: -11     Time  6    Period  Weeks    Status  New      PT LONG TERM GOAL #2   Title  Pt will be able to stand for 1 hour to demonstrate improved endurance in standing with goal of eventual return to work    Time  6    Period  Weeks    Status  New      PT LONG TERM GOAL #3   Title  Pt will demonstrate safe technique with sit<>stand transfers 5/5x for improved safety and independence    Baseline  Requires cues (see evaluation note)    Time  3    Period  Weeks    Status  New      PT LONG TERM GOAL #4   Title  Patient will increase 10 meter walk test to >1.29m/s as to improve gait speed for better community ambulation and to reduce fall risk  Baseline  5/30=.63  m/s with RW     Time  6    Period  Weeks    Status  New    Target Date  04/24/18      PT LONG TERM GOAL #5   Title  Patient will reduce timed up and go to <11 seconds to reduce fall risk and demonstrate improved transfer/gait ability.    Baseline  5/30: 25.21 seconds    Time  6    Period  Weeks    Status  New    Target Date  04/24/18            Plan - 03/26/18 1048    Clinical Impression Statement  Patient demonstrates increased ambulatory capacity and improved capability of negotiating a variety of surfaces with ambulation. Patient demonstrates good weight shift between L and RLE however is limited by limited extension of RLE at this time. Tight R hip flexors result in slight trunk flexion in standing position. Pt will benefit from continued skilled PT interventions for improved gait mechanics and independence.     Rehab Potential  Good    PT Frequency  2x / week    PT Duration  6 weeks    PT Treatment/Interventions  ADLs/Self Care Home Management;Aquatic Therapy;Biofeedback;Cryotherapy;Electrical Stimulation;Iontophoresis 4mg /ml Dexamethasone;Moist Heat;Ultrasound;Contrast Bath;DME Instruction;Stair training;Gait training;Functional mobility training;Therapeutic activities;Therapeutic exercise;Balance training;Neuromuscular re-education;Patient/family education;Prosthetic Training;Manual techniques;Wheelchair Investment banker, operational;Compression bandaging;Passive range of motion;Dry needling;Energy conservation;Taping    PT Next Visit Plan  address night pain/positioning, 90mWT, 5xSTS, TUG, ABC scale, gait training, exercises to improve R knee AROM     PT Home Exercise Plan  R knee presses with overpressure from wife, densistization light squeezes to distal LLE, LAQ, ambulating in home, prone R hip flexor stretch with pillow x3 minutes    Consulted and Agree with Plan of Care  Patient;Family member/caregiver    Family Member Consulted  wife       Patient will benefit  from skilled therapeutic intervention in order to improve the following deficits and impairments:  Abnormal gait, Decreased activity tolerance, Decreased balance, Decreased endurance, Decreased coordination, Decreased knowledge of use of DME, Decreased mobility, Decreased range of motion, Decreased safety awareness, Decreased strength, Difficulty walking, Hypomobility, Increased fascial restricitons, Increased muscle spasms, Impaired perceived functional ability, Impaired flexibility, Impaired sensation, Improper body mechanics, Postural dysfunction, Prosthetic Dependency, Pain  Visit Diagnosis: Other abnormalities of gait and mobility  Unsteadiness on feet  Muscle weakness (generalized)     Problem List Patient Active Problem List   Diagnosis Date Noted  . Peripheral vascular disease ([place]) 10/22/2017  . Hx of BKA, right ([place]) 09/30/2017  . Gangrene from atherosclerosis, extremities ([place]) 08/25/2017  . Ischemic leg 08/15/2017  . HLD (hyperlipidemia) 07/16/2017  . Hypertension 06/28/2017  . Tobacco use disorder 06/28/2017  . Atherosclerosis of native arteries of the extremities with ulceration ([place]) 06/28/2017   Janna Arch, PT, DPT   03/26/2018, 11:02 AM  [place] MAIN [facility] SERVICES [address] [place], [place], [address] Phone: [phone]   Fax:  [phone]  Name: Jordan Mills MRN: [id number] Date of Birth: Jan 23, 1951

## 2018-03-31 ENCOUNTER — Ambulatory Visit: Payer: Medicare Other | Admitting: Physical Therapy

## 2018-03-31 ENCOUNTER — Encounter: Payer: Self-pay | Admitting: Physical Therapy

## 2018-03-31 DIAGNOSIS — M6281 Muscle weakness (generalized): Secondary | ICD-10-CM

## 2018-03-31 DIAGNOSIS — M109 Gout, unspecified: Secondary | ICD-10-CM | POA: Diagnosis not present

## 2018-03-31 DIAGNOSIS — R2689 Other abnormalities of gait and mobility: Secondary | ICD-10-CM | POA: Diagnosis not present

## 2018-03-31 DIAGNOSIS — B353 Tinea pedis: Secondary | ICD-10-CM | POA: Diagnosis not present

## 2018-03-31 DIAGNOSIS — R2681 Unsteadiness on feet: Secondary | ICD-10-CM

## 2018-03-31 DIAGNOSIS — N182 Chronic kidney disease, stage 2 (mild): Secondary | ICD-10-CM | POA: Diagnosis not present

## 2018-03-31 DIAGNOSIS — I119 Hypertensive heart disease without heart failure: Secondary | ICD-10-CM | POA: Diagnosis not present

## 2018-03-31 NOTE — Therapy (Signed)
[place] MAIN [facility] SERVICES [address] [place], [place], [address] Phone: [phone]   Fax:  [phone]  Physical Therapy Treatment  Patient Details  Name: Jordan Mills MRN: [id number] Date of Birth: 1950/12/12 Referring Provider: Sela Hua, PA-C   Encounter Date: 03/31/2018  PT End of Session - 03/31/18 1302    Visit Number  7    Number of Visits  13    Date for PT Re-Evaluation  04/17/18    Authorization Type  7/10 progress note    PT Start Time  1301    PT Stop Time  1345    PT Time Calculation (min)  44 min    Equipment Utilized During Treatment  Gait belt    Activity Tolerance  Patient tolerated treatment well;Treatment limited secondary to medical complications (Comment) elevated BP    Behavior During Therapy  WFL for tasks assessed/performed       Past Medical History:  Diagnosis Date  . Gout   . HLD (hyperlipidemia)   . Hypertension   . Peripheral vascular disease [facility])     Past Surgical History:  Procedure Laterality Date  . AMPUTATION Right 08/28/2017   Procedure: AMPUTATION BELOW KNEE;  Surgeon: Algernon Huxley, MD;  Location: [facility] ORS;  Service: Vascular;  Laterality: Right;  . AMPUTATION TOE Right 07/19/2017   Procedure: AMPUTATION TOE/Rt 4th MPJ;  Surgeon: Sharlotte Alamo, DPM;  Location: [facility] ORS;  Service: Podiatry;  Laterality: Right;  . LOWER EXTREMITY ANGIOGRAPHY Right 07/01/2017   Procedure: Lower Extremity Angiography;  Surgeon: Algernon Huxley, MD;  Location: [place] [facility];  Service: Cardiovascular;  Laterality: Right;  . LOWER EXTREMITY ANGIOGRAPHY Right 08/15/2017   Procedure: Lower Extremity Angiography;  Surgeon: Algernon Huxley, MD;  Location: [place] [facility];  Service: Cardiovascular;  Laterality: Right;  . LOWER EXTREMITY ANGIOGRAPHY Right 08/16/2017   Procedure: LOWER EXTREMITY ANGIOGRAPHY;  Surgeon: Algernon Huxley, MD;  Location: [place] [facility];  Service: Cardiovascular;   Laterality: Right;  . LOWER EXTREMITY INTERVENTION  08/15/2017   Procedure: LOWER EXTREMITY INTERVENTION;  Surgeon: Algernon Huxley, MD;  Location: [place] CV LAB;  Service: Cardiovascular;;  . TONSILLECTOMY      There were no vitals filed for this visit.  Subjective Assessment - 03/31/18 1306    Subjective  Pt reports he slept on his L shoulder wrong last night and is having some pain.  He has been practicing walking indoors and outdoors without any issues.  Pt has been completing his HEP 3 days/wk without questions or concerns (encouraged pt to complete 4 days/wk).  Pt reports that his MD wrote the same prescription and dosage for his BP medication when he had his appointment at 6/3, wife noticed this the following day and called and pt has a follow up appointment on 6/21.      Pertinent History  Pt presented yesterday to this PT clinic for evaluation but was unable to be seen due to elevated BP. See note. Pt took his BP medication this morning and will take again as scheduled at 3pm, per pt. Pt is a 67 y/o M s/p R BKA 08/28/17 who presents to therapy for gait training with prosthesis. Pt is wearing his shrinker 24/7. Pt reports he received his prosthesis 2 weeks ago and was told not to use it until he started PT. Pt has not received PT since his BKA. Pt reports he has phantom pain at night but not  during the day, pain as high as 9/10. Pt has been ambulating up to 100 yards with RW. Pt reports 1 fall shortly after R BKA but no falls since. Pt is independent with dressing, bathing (gets into tub to take a bath), driving. Goal: to be able to walk with prosthesis. Pt denies any issues getting up his steps, hops up one step at a time.     Limitations  Walking;Standing    How long can you walk comfortably?  100 yards with RW    Patient Stated Goals  to ambulate with prosthesis and return to work    Currently in Pain?  Yes    Pain Score  6     Pain Location  Shoulder    Pain Orientation  Left    Pain  Descriptors / Indicators  Aching    Pain Type  Acute pain    Pain Onset  Today         TREATMENT   Vitals taken at start of session with pt in sitting: 138/67/78, SpO2 99%, pulse 81   Seated hamstring stretch with manual assist from PT 3x60 seconds   R patella grade III-IV lateral joint mobs 3x30 seconds   R grade III-IV AP tibia on femur joint mobs 3x30 seconds   Prone R hip E x20   Standing hip E x20 RLE   High knee marching in // bars 10x each leg BUE support; difficulty straightening RLE in stance phase. Cues to weight shift over to RLE in SLS rather than relying on UE strength.   Alternating toe taps to 4" step with BUE support x10 each LE   Repeated with only LUE supported x4   Repeated with no UE support but requiring intermittent min assist due to imbalance with R SLS   Ambulating backward with RW with emphasis on taking larger step for increased glute recruitment and greater R hamstring stretch   Standing with L foot on airex and reaching activities with UE to increase WBing through RLE                        PT Education - 03/31/18 1301    Education provided  Yes    Education Details  Exercise technique    Person(s) Educated  Patient    Methods  Explanation;Demonstration;Verbal cues    Comprehension  Verbalized understanding;Returned demonstration;Verbal cues required;Need further instruction       PT Short Term Goals - 03/13/18 1224      PT SHORT TERM GOAL #1   Title  Pt will independently complete HEP at least 4 days/wk for improved carryover between sessions    Time  2    Period  Weeks    Status  New      PT SHORT TERM GOAL #2   Title  Patient (> 75 years old) will complete five times sit to stand test in < 15 seconds indicating an increased LE strength and improved balance.    Baseline  5/30: 18 seconds    Time  2    Period  Weeks    Status  New    Target Date  03/27/18        PT Long Term Goals - 03/13/18 1224      PT  LONG TERM GOAL #1   Title  Pt's R knee F and E will improve to WNL for improved gait mechanics    Baseline  F: 98  E: -11  Time  6    Period  Weeks    Status  New      PT LONG TERM GOAL #2   Title  Pt will be able to stand for 1 hour to demonstrate improved endurance in standing with goal of eventual return to work    Time  6    Period  Weeks    Status  New      PT LONG TERM GOAL #3   Title  Pt will demonstrate safe technique with sit<>stand transfers 5/5x for improved safety and independence    Baseline  Requires cues (see evaluation note)    Time  3    Period  Weeks    Status  New      PT LONG TERM GOAL #4   Title  Patient will increase 10 meter walk test to >1.78m/s as to improve gait speed for better community ambulation and to reduce fall risk    Baseline  5/30=.63 m/s with RW     Time  6    Period  Weeks    Status  New    Target Date  04/24/18      PT LONG TERM GOAL #5   Title  Patient will reduce timed up and go to <11 seconds to reduce fall risk and demonstrate improved transfer/gait ability.    Baseline  5/30: 25.21 seconds    Time  6    Period  Weeks    Status  New    Target Date  04/24/18            Plan - 03/31/18 1308    Clinical Impression Statement  Continued efforts to stretch R hamstring and improve mobility at R knee to promote knee E.  Introduced standing R hip E to compliment prone hip E exercise for improved glute strength with the goal of carryover to gait mechanics.  Dec weight shift to RLE with SLS activity but improvement with cues. Had pt complete exercises to promote weight shift to RLE.  Pt will benefit from continued skilled PT interventions for improved strength, ROM, and gait mechanics.     Rehab Potential  Good    PT Frequency  2x / week    PT Duration  6 weeks    PT Treatment/Interventions  ADLs/Self Care Home Management;Aquatic Therapy;Biofeedback;Cryotherapy;Electrical Stimulation;Iontophoresis 4mg /ml Dexamethasone;Moist  Heat;Ultrasound;Contrast Bath;DME Instruction;Stair training;Gait training;Functional mobility training;Therapeutic activities;Therapeutic exercise;Balance training;Neuromuscular re-education;Patient/family education;Prosthetic Training;Manual techniques;Wheelchair Investment banker, operational;Compression bandaging;Passive range of motion;Dry needling;Energy conservation;Taping    PT Next Visit Plan  address night pain/positioning, 3mWT, 5xSTS, TUG, ABC scale, gait training, exercises to improve R knee AROM     PT Home Exercise Plan  R knee presses with overpressure from wife, densistization light squeezes to distal LLE, LAQ, ambulating in home, prone R hip flexor stretch with pillow x3 minutes    Consulted and Agree with Plan of Care  Patient;Family member/caregiver    Family Member Consulted  --       Patient will benefit from skilled therapeutic intervention in order to improve the following deficits and impairments:  Abnormal gait, Decreased activity tolerance, Decreased balance, Decreased endurance, Decreased coordination, Decreased knowledge of use of DME, Decreased mobility, Decreased range of motion, Decreased safety awareness, Decreased strength, Difficulty walking, Hypomobility, Increased fascial restricitons, Increased muscle spasms, Impaired perceived functional ability, Impaired flexibility, Impaired sensation, Improper body mechanics, Postural dysfunction, Prosthetic Dependency, Pain  Visit Diagnosis: Other abnormalities of gait and mobility  Unsteadiness on feet  Muscle weakness (generalized)  Problem List Patient Active Problem List   Diagnosis Date Noted  . Peripheral vascular disease ([place]) 10/22/2017  . Hx of BKA, right ([place]) 09/30/2017  . Gangrene from atherosclerosis, extremities ([place]) 08/25/2017  . Ischemic leg 08/15/2017  . HLD (hyperlipidemia) 07/16/2017  . Hypertension 06/28/2017  . Tobacco use disorder 06/28/2017  . Atherosclerosis of native arteries  of the extremities with ulceration ([place]) 06/28/2017    Collie Siad PT, DPT 03/31/2018, 1:51 PM  [place] MAIN [facility] SERVICES [address] [place], [place], [address] Phone: [phone]   Fax:  [phone]  Name: Jordan Mills MRN: [id number] Date of Birth: 1951/07/21

## 2018-04-02 ENCOUNTER — Encounter: Payer: Self-pay | Admitting: Physical Therapy

## 2018-04-02 ENCOUNTER — Ambulatory Visit: Payer: Medicare Other | Admitting: Physical Therapy

## 2018-04-02 VITALS — BP 139/60 | HR 118

## 2018-04-02 DIAGNOSIS — R2689 Other abnormalities of gait and mobility: Secondary | ICD-10-CM

## 2018-04-02 DIAGNOSIS — M6281 Muscle weakness (generalized): Secondary | ICD-10-CM | POA: Diagnosis not present

## 2018-04-02 DIAGNOSIS — R2681 Unsteadiness on feet: Secondary | ICD-10-CM | POA: Diagnosis not present

## 2018-04-02 NOTE — Therapy (Signed)
[place] MAIN [facility] SERVICES [address] [place], [place], [address] Phone: [phone]   Fax:  [phone]  Physical Therapy Treatment  Patient Details  Name: Jordan Mills MRN: [id number] Date of Birth: 1950/10/26 Referring Provider: Sela Hua, PA-C   Encounter Date: 04/02/2018  PT End of Session - 04/02/18 0940    Visit Number  8    Number of Visits  13    Date for PT Re-Evaluation  04/17/18    Authorization Type  8/10 progress note    PT Start Time  0945    PT Stop Time  1030    PT Time Calculation (min)  45 min    Equipment Utilized During Treatment  Gait belt    Activity Tolerance  Patient tolerated treatment well;Treatment limited secondary to medical complications (Comment) elevated BP    Behavior During Therapy  WFL for tasks assessed/performed       Past Medical History:  Diagnosis Date  . Gout   . HLD (hyperlipidemia)   . Hypertension   . Peripheral vascular disease [facility])     Past Surgical History:  Procedure Laterality Date  . AMPUTATION Right 08/28/2017   Procedure: AMPUTATION BELOW KNEE;  Surgeon: Algernon Huxley, MD;  Location: [facility] ORS;  Service: Vascular;  Laterality: Right;  . AMPUTATION TOE Right 07/19/2017   Procedure: AMPUTATION TOE/Rt 4th MPJ;  Surgeon: Sharlotte Alamo, DPM;  Location: [facility] ORS;  Service: Podiatry;  Laterality: Right;  . LOWER EXTREMITY ANGIOGRAPHY Right 07/01/2017   Procedure: Lower Extremity Angiography;  Surgeon: Algernon Huxley, MD;  Location: [place] CV LAB;  Service: Cardiovascular;  Laterality: Right;  . LOWER EXTREMITY ANGIOGRAPHY Right 08/15/2017   Procedure: Lower Extremity Angiography;  Surgeon: Algernon Huxley, MD;  Location: [place] CV LAB;  Service: Cardiovascular;  Laterality: Right;  . LOWER EXTREMITY ANGIOGRAPHY Right 08/16/2017   Procedure: LOWER EXTREMITY ANGIOGRAPHY;  Surgeon: Algernon Huxley, MD;  Location: [place] CV LAB;  Service: Cardiovascular;   Laterality: Right;  . LOWER EXTREMITY INTERVENTION  08/15/2017   Procedure: LOWER EXTREMITY INTERVENTION;  Surgeon: Algernon Huxley, MD;  Location: [place] CV LAB;  Service: Cardiovascular;;  . TONSILLECTOMY      Vitals:   04/02/18 0956  BP: 139/60  Pulse: (!) 118  SpO2: 100%    Subjective Assessment - 04/02/18 0956    Subjective  Patient reports doing well; denies any pain; reports wearing 2 ply sock on residual limb, no redness noted, no discomfort;     Pertinent History  Pt presented yesterday to this PT clinic for evaluation but was unable to be seen due to elevated BP. See note. Pt took his BP medication this morning and will take again as scheduled at 3pm, per pt. Pt is a 67 y/o M s/p R BKA 08/28/17 who presents to therapy for gait training with prosthesis. Pt is wearing his shrinker 24/7. Pt reports he received his prosthesis 2 weeks ago and was told not to use it until he started PT. Pt has not received PT since his BKA. Pt reports he has phantom pain at night but not during the day, pain as high as 9/10. Pt has been ambulating up to 100 yards with RW. Pt reports 1 fall shortly after R BKA but no falls since. Pt is independent with dressing, bathing (gets into tub to take a bath), driving. Goal: to be able to walk with prosthesis. Pt denies any issues getting up his  steps, hops up one step at a time.     Limitations  Walking;Standing    How long can you walk comfortably?  100 yards with RW    Patient Stated Goals  to ambulate with prosthesis and return to work          TREATMENT   Vitals taken at start of session with pt in sitting: 139/60; does exhibits increased heart rate; states he is waiting on a beta blocker;  Patient supine: PT performed soft tissue massage to patient's right hamstring with rolling stick x4 min x2 sets to facilitate increased tissue extensibility; Supine RLE hamstring stretch, contract 5sec, relax 10 sec x3 sets to facilitate better knee extension  ROM; R grade III-IV AP tibia on femur joint mobs 3x30 seconds  RLE quad set 5 sec hold x10 reps to facilitate better knee extension ROM;   Patient has on 2 socks (1 yellow 3 ply and 1 green 5 ply) however prosthetic too loose; started with 1 ply at a time and eventually put on 13 ply sock to fit patient (2 green and 1 yellow); Educated patient in how to determine proper fit of prosthetic.  Gait training: Instructed patient in gait around gym on level surface with RW, supervision with cues to increase gluteal activation in right stance for better step length on LLE and improved erect posture. Often patient exhibits increased right hip flexion in stance due to poor gluteal activation; x160 feet x2 sets;  Gait in hallway on carpet with RW x200 feet, CGA with head turns side/side with cognitive task of calling out cards on wall with min VCs to improve erect posture, increase step length and improve weight shift;    Balance: Standing in parallel bars: LLE toe taps on 4 inch step with RLE on firm surface, finger tip hold to no UE support x20 reps with CGA for safety and mod Vcs for gluteal activation for better weight shift and stance control.   Turning with RW on level surface x4 turns with CGA to close supervision with min Vcs for walker placement and step sequencing for better dynamic balance;                      PT Education - 04/02/18 0940    Education provided  Yes    Education Details  stretching, gait safety, balance;     Person(s) Educated  Patient    Methods  Explanation;Demonstration;Verbal cues    Comprehension  Verbalized understanding;Returned demonstration;Verbal cues required;Need further instruction       PT Short Term Goals - 03/13/18 1224      PT SHORT TERM GOAL #1   Title  Pt will independently complete HEP at least 4 days/wk for improved carryover between sessions    Time  2    Period  Weeks    Status  New      PT SHORT TERM GOAL #2   Title   Patient (> 67 years old) will complete five times sit to stand test in < 15 seconds indicating an increased LE strength and improved balance.    Baseline  5/30: 18 seconds    Time  2    Period  Weeks    Status  New    Target Date  03/27/18        PT Long Term Goals - 03/13/18 1224      PT LONG TERM GOAL #1   Title  Pt's R knee F and E  will improve to WNL for improved gait mechanics    Baseline  F: 98  E: -11     Time  6    Period  Weeks    Status  New      PT LONG TERM GOAL #2   Title  Pt will be able to stand for 1 hour to demonstrate improved endurance in standing with goal of eventual return to work    Time  6    Period  Weeks    Status  New      PT LONG TERM GOAL #3   Title  Pt will demonstrate safe technique with sit<>stand transfers 5/5x for improved safety and independence    Baseline  Requires cues (see evaluation note)    Time  3    Period  Weeks    Status  New      PT LONG TERM GOAL #4   Title  Patient will increase 10 meter walk test to >1.77m/s as to improve gait speed for better community ambulation and to reduce fall risk    Baseline  5/30=.63 m/s with RW     Time  6    Period  Weeks    Status  New    Target Date  04/24/18      PT LONG TERM GOAL #5   Title  Patient will reduce timed up and go to <11 seconds to reduce fall risk and demonstrate improved transfer/gait ability.    Baseline  5/30: 25.21 seconds    Time  6    Period  Weeks    Status  New    Target Date  04/24/18            Plan - 04/02/18 1054    Clinical Impression Statement  PT performed extensive stretching and soft tissue work to facilitate better knee extension ROM in stance. He was educated on importance of HEP adherence for better strengthening and joint control. PT assessed fit of prosthetic on RLE. PT advised patient to add socks to RLE for better fit; Added to sock ply for better fit. patient educated on importance of good fit and to visually inspect skin daily to avoid  breakdown. He verbalized understanding. Patient educated in weight shift to RLE to facilitate better stance control. Instructed patient in dynamic gait training for better community ambulation. He does have difficulty activating RLE gluteals during right stance exhibiting increased hip flexion and decreased step length on LLE. He would benefit from additional skilled PT intervention to improve strength and mobility;     Rehab Potential  Good    PT Frequency  2x / week    PT Duration  6 weeks    PT Treatment/Interventions  ADLs/Self Care Home Management;Aquatic Therapy;Biofeedback;Cryotherapy;Electrical Stimulation;Iontophoresis 4mg /ml Dexamethasone;Moist Heat;Ultrasound;Contrast Bath;DME Instruction;Stair training;Gait training;Functional mobility training;Therapeutic activities;Therapeutic exercise;Balance training;Neuromuscular re-education;Patient/family education;Prosthetic Training;Manual techniques;Wheelchair Investment banker, operational;Compression bandaging;Passive range of motion;Dry needling;Energy conservation;Taping    PT Next Visit Plan  address night pain/positioning, 36mWT, 5xSTS, TUG, ABC scale, gait training, exercises to improve R knee AROM     PT Home Exercise Plan  R knee presses with overpressure from wife, densistization light squeezes to distal LLE, LAQ, ambulating in home, prone R hip flexor stretch with pillow x3 minutes    Consulted and Agree with Plan of Care  Patient;Family member/caregiver       Patient will benefit from skilled therapeutic intervention in order to improve the following deficits and impairments:  Abnormal gait, Decreased activity tolerance, Decreased  balance, Decreased endurance, Decreased coordination, Decreased knowledge of use of DME, Decreased mobility, Decreased range of motion, Decreased safety awareness, Decreased strength, Difficulty walking, Hypomobility, Increased fascial restricitons, Increased muscle spasms, Impaired perceived functional  ability, Impaired flexibility, Impaired sensation, Improper body mechanics, Postural dysfunction, Prosthetic Dependency, Pain  Visit Diagnosis: Other abnormalities of gait and mobility  Unsteadiness on feet  Muscle weakness (generalized)     Problem List Patient Active Problem List   Diagnosis Date Noted  . Peripheral vascular disease ([place]) 10/22/2017  . Hx of BKA, right ([place]) 09/30/2017  . Gangrene from atherosclerosis, extremities ([place]) 08/25/2017  . Ischemic leg 08/15/2017  . HLD (hyperlipidemia) 07/16/2017  . Hypertension 06/28/2017  . Tobacco use disorder 06/28/2017  . Atherosclerosis of native arteries of the extremities with ulceration ([place]) 06/28/2017    Jordan Mills DPT 04/02/2018, 10:58 AM  [place] MAIN [facility] SERVICES [address] [place], [place], [address] Phone: [phone]   Fax:  [phone]  Name: Jordan Mills MRN: [id number] Date of Birth: 09-09-1951

## 2018-04-07 ENCOUNTER — Ambulatory Visit: Payer: Medicare Other

## 2018-04-07 DIAGNOSIS — R2681 Unsteadiness on feet: Secondary | ICD-10-CM

## 2018-04-07 DIAGNOSIS — R2689 Other abnormalities of gait and mobility: Secondary | ICD-10-CM | POA: Diagnosis not present

## 2018-04-07 DIAGNOSIS — M6281 Muscle weakness (generalized): Secondary | ICD-10-CM

## 2018-04-07 NOTE — Therapy (Signed)
[place] MAIN [facility] SERVICES [address] [place], [place], [address] Phone: [phone]   Fax:  [phone]  Physical Therapy Treatment  Patient Details  Name: Jordan Mills MRN: [id number] Date of Birth: November 17, 1950 Referring Provider: Sela Hua, PA-C   Encounter Date: 04/07/2018  PT End of Session - 04/07/18 1100    Visit Number  9    Number of Visits  13    Date for PT Re-Evaluation  04/17/18    Authorization Type  9/10 progress note    PT Start Time  1015    PT Stop Time  1059    PT Time Calculation (min)  44 min    Equipment Utilized During Treatment  Gait belt    Activity Tolerance  Patient tolerated treatment well;Treatment limited secondary to medical complications (Comment) elevated BP    Behavior During Therapy  WFL for tasks assessed/performed       Past Medical History:  Diagnosis Date  . Gout   . HLD (hyperlipidemia)   . Hypertension   . Peripheral vascular disease [facility])     Past Surgical History:  Procedure Laterality Date  . AMPUTATION Right 08/28/2017   Procedure: AMPUTATION BELOW KNEE;  Surgeon: Algernon Huxley, MD;  Location: [facility] ORS;  Service: Vascular;  Laterality: Right;  . AMPUTATION TOE Right 07/19/2017   Procedure: AMPUTATION TOE/Rt 4th MPJ;  Surgeon: Sharlotte Alamo, DPM;  Location: [facility] ORS;  Service: Podiatry;  Laterality: Right;  . LOWER EXTREMITY ANGIOGRAPHY Right 07/01/2017   Procedure: Lower Extremity Angiography;  Surgeon: Algernon Huxley, MD;  Location: [place] CV LAB;  Service: Cardiovascular;  Laterality: Right;  . LOWER EXTREMITY ANGIOGRAPHY Right 08/15/2017   Procedure: Lower Extremity Angiography;  Surgeon: Algernon Huxley, MD;  Location: [place] CV LAB;  Service: Cardiovascular;  Laterality: Right;  . LOWER EXTREMITY ANGIOGRAPHY Right 08/16/2017   Procedure: LOWER EXTREMITY ANGIOGRAPHY;  Surgeon: Algernon Huxley, MD;  Location: [place] CV LAB;  Service: Cardiovascular;   Laterality: Right;  . LOWER EXTREMITY INTERVENTION  08/15/2017   Procedure: LOWER EXTREMITY INTERVENTION;  Surgeon: Algernon Huxley, MD;  Location: [place] CV LAB;  Service: Cardiovascular;;  . TONSILLECTOMY      There were no vitals filed for this visit.  Subjective Assessment - 04/07/18 1021    Subjective  Patietn reports wearing two ply sock on residual limb. Has been walking at home, reports shoulder pain has resolved.     Pertinent History  Pt presented yesterday to this PT clinic for evaluation but was unable to be seen due to elevated BP. See note. Pt took his BP medication this morning and will take again as scheduled at 3pm, per pt. Pt is a 67 y/o M s/p R BKA 08/28/17 who presents to therapy for gait training with prosthesis. Pt is wearing his shrinker 24/7. Pt reports he received his prosthesis 2 weeks ago and was told not to use it until he started PT. Pt has not received PT since his BKA. Pt reports he has phantom pain at night but not during the day, pain as high as 9/10. Pt has been ambulating up to 100 yards with RW. Pt reports 1 fall shortly after R BKA but no falls since. Pt is independent with dressing, bathing (gets into tub to take a bath), driving. Goal: to be able to walk with prosthesis. Pt denies any issues getting up his steps, hops up one step at a time.  Limitations  Walking;Standing    How long can you walk comfortably?  100 yards with RW    Patient Stated Goals  to ambulate with prosthesis and return to work    Currently in Pain?  No/denies      vitals: 144/61 pulse 68       13 ply sock per patient report (2 green and 1 yellow); Educated patient in how to determine proper fit of prosthetic.    Gait in hallway on carpet with RW x200 feet,   Gait in hallway on carpet with RW x200 feet, CGA with head turns side/side with cognitive task of calling out cards on wall with min VCs to improve erect posture, increase step length and improve weight shift;    Ambulating in // bars with decreasing UE support to single UE (LUE) cues for increasing step length.   Seated hamstring stretch 2x60 seconds   Education on QC, ambulate with QC with CGA and verbal cueing for sequencing in gym (~96 ft) cues for sequencing : cane right left. And for widening BOS  Standing marches with board between feet to increase BOS for carryover to ambulation. 35 seconds BUE support  Seated abduction GTB 1 leg at a time 10x each leg   Static balance balloon toss game on stable surface in // bars with CGA x3 minutes   step over and back orange hurdle SUE support 10x each leg   Ambulate backwards in // bars 4x length of bars BUE support      Pt. response to medical necessity:  Pt will benefit from skilled PT interventions for improved gait mechanics and improved ROM and strength           PT Education - 04/07/18 1022    Education provided  Yes    Education Details  thickness of socks will change based on how his leg shrinks and swells    Person(s) Educated  Patient    Methods  Explanation    Comprehension  Verbalized understanding       PT Short Term Goals - 03/13/18 1224      PT SHORT TERM GOAL #1   Title  Pt will independently complete HEP at least 4 days/wk for improved carryover between sessions    Time  2    Period  Weeks    Status  New      PT SHORT TERM GOAL #2   Title  Patient (> 60 years old) will complete five times sit to stand test in < 15 seconds indicating an increased LE strength and improved balance.    Baseline  5/30: 18 seconds    Time  2    Period  Weeks    Status  New    Target Date  03/27/18        PT Long Term Goals - 03/13/18 1224      PT LONG TERM GOAL #1   Title  Pt's R knee F and E will improve to WNL for improved gait mechanics    Baseline  F: 98  E: -11     Time  6    Period  Weeks    Status  New      PT LONG TERM GOAL #2   Title  Pt will be able to stand for 1 hour to demonstrate improved  endurance in standing with goal of eventual return to work    Time  6    Period  Weeks    Status  New      PT LONG TERM GOAL #3   Title  Pt will demonstrate safe technique with sit<>stand transfers 5/5x for improved safety and independence    Baseline  Requires cues (see evaluation note)    Time  3    Period  Weeks    Status  New      PT LONG TERM GOAL #4   Title  Patient will increase 10 meter walk test to >1.53m/s as to improve gait speed for better community ambulation and to reduce fall risk    Baseline  5/30=.63 m/s with RW     Time  6    Period  Weeks    Status  New    Target Date  04/24/18      PT LONG TERM GOAL #5   Title  Patient will reduce timed up and go to <11 seconds to reduce fall risk and demonstrate improved transfer/gait ability.    Baseline  5/30: 25.21 seconds    Time  6    Period  Weeks    Status  New    Target Date  04/24/18            Plan - 04/07/18 1106    Clinical Impression Statement  Patient demonstrated improved ambulatory capacity with decreased need for assistive device. Progression from walker to QC initiated today with patient demonstrating good quality of movement with need for verbal cueing of base of support width as well as sequencing of cane. Pt will benefit from skilled PT interventions for improved gait mechanics and improved ROM and strength    Rehab Potential  Good    PT Frequency  2x / week    PT Duration  6 weeks    PT Treatment/Interventions  ADLs/Self Care Home Management;Aquatic Therapy;Biofeedback;Cryotherapy;Electrical Stimulation;Iontophoresis 4mg /ml Dexamethasone;Moist Heat;Ultrasound;Contrast Bath;DME Instruction;Stair training;Gait training;Functional mobility training;Therapeutic activities;Therapeutic exercise;Balance training;Neuromuscular re-education;Patient/family education;Prosthetic Training;Manual techniques;Wheelchair Investment banker, operational;Compression bandaging;Passive range of motion;Dry  needling;Energy conservation;Taping    PT Next Visit Plan  address night pain/positioning, 22mWT, 5xSTS, TUG, ABC scale, gait training, exercises to improve R knee AROM     PT Home Exercise Plan  R knee presses with overpressure from wife, densistization light squeezes to distal LLE, LAQ, ambulating in home, prone R hip flexor stretch with pillow x3 minutes    Consulted and Agree with Plan of Care  Patient;Family member/caregiver       Patient will benefit from skilled therapeutic intervention in order to improve the following deficits and impairments:  Abnormal gait, Decreased activity tolerance, Decreased balance, Decreased endurance, Decreased coordination, Decreased knowledge of use of DME, Decreased mobility, Decreased range of motion, Decreased safety awareness, Decreased strength, Difficulty walking, Hypomobility, Increased fascial restricitons, Increased muscle spasms, Impaired perceived functional ability, Impaired flexibility, Impaired sensation, Improper body mechanics, Postural dysfunction, Prosthetic Dependency, Pain  Visit Diagnosis: Other abnormalities of gait and mobility  Unsteadiness on feet  Muscle weakness (generalized)     Problem List Patient Active Problem List   Diagnosis Date Noted  . Peripheral vascular disease ([place]) 10/22/2017  . Hx of BKA, right ([place]) 09/30/2017  . Gangrene from atherosclerosis, extremities ([place]) 08/25/2017  . Ischemic leg 08/15/2017  . HLD (hyperlipidemia) 07/16/2017  . Hypertension 06/28/2017  . Tobacco use disorder 06/28/2017  . Atherosclerosis of native arteries of the extremities with ulceration ([place]) 06/28/2017   Janna Arch, PT, DPT   04/07/2018, 11:08 AM  [place] MAIN [facility] SERVICES [address] [place], [place], [address]  Phone: [phone]   Fax:  [phone]  Name: JERRALD DOVERSPIKE MRN: [id number] Date of Birth: [birth year]

## 2018-04-09 ENCOUNTER — Ambulatory Visit: Payer: Medicare Other

## 2018-04-09 DIAGNOSIS — M6281 Muscle weakness (generalized): Secondary | ICD-10-CM | POA: Diagnosis not present

## 2018-04-09 DIAGNOSIS — R2689 Other abnormalities of gait and mobility: Secondary | ICD-10-CM

## 2018-04-09 DIAGNOSIS — R2681 Unsteadiness on feet: Secondary | ICD-10-CM

## 2018-04-09 NOTE — Therapy (Signed)
[place] MAIN [facility] SERVICES [place], [place], [address] Phone: [phone]   Fax:  [phone]  Physical Therapy Treatment Physical Therapy Progress Note   Dates of reporting period  03/06/18  to   04/09/18  Patient's condition has the potential to improve in response to therapy. Maximum improvement is yet to be obtained. The anticipated improvement is attainable and reasonable in a generally predictable time. Start date of reporting period 03/06/18 end date of reporting period 04/09/18. Patient reports he is moving around better at home being able to ambulate inside and outside with RW. Is ready to start transitioning towards QC/ cane for more independence.    Patient Details  Name: Jordan Mills MRN: [id number] Date of Birth: 02-20-1951 Referring Provider: Sela Hua, PA-C   Encounter Date: 04/09/2018  PT End of Session - 04/09/18 1101    Visit Number  10    Number of Visits  25    Date for PT Re-Evaluation  05/21/18    Authorization Type  10/10 progress note; (next session 1/10) start date 04/09/18    PT Start Time  1017    PT Stop Time  1058    PT Time Calculation (min)  41 min    Equipment Utilized During Treatment  Gait belt    Activity Tolerance  Patient tolerated treatment well;Treatment limited secondary to medical complications (Comment) elevated BP    Behavior During Therapy  WFL for tasks assessed/performed       Past Medical History:  Diagnosis Date  . Gout   . HLD (hyperlipidemia)   . Hypertension   . Peripheral vascular disease [facility])     Past Surgical History:  Procedure Laterality Date  . AMPUTATION Right 08/28/2017   Procedure: AMPUTATION BELOW KNEE;  Surgeon: Algernon Huxley, MD;  Location: [facility] ORS;  Service: Vascular;  Laterality: Right;  . AMPUTATION TOE Right 07/19/2017   Procedure: AMPUTATION TOE/Rt 4th MPJ;  Surgeon: Sharlotte Alamo, DPM;  Location: [facility] ORS;  Service: Podiatry;  Laterality:  Right;  . LOWER EXTREMITY ANGIOGRAPHY Right 07/01/2017   Procedure: Lower Extremity Angiography;  Surgeon: Algernon Huxley, MD;  Location: [place] CV LAB;  Service: Cardiovascular;  Laterality: Right;  . LOWER EXTREMITY ANGIOGRAPHY Right 08/15/2017   Procedure: Lower Extremity Angiography;  Surgeon: Algernon Huxley, MD;  Location: [place] CV LAB;  Service: Cardiovascular;  Laterality: Right;  . LOWER EXTREMITY ANGIOGRAPHY Right 08/16/2017   Procedure: LOWER EXTREMITY ANGIOGRAPHY;  Surgeon: Algernon Huxley, MD;  Location: [place] CV LAB;  Service: Cardiovascular;  Laterality: Right;  . LOWER EXTREMITY INTERVENTION  08/15/2017   Procedure: LOWER EXTREMITY INTERVENTION;  Surgeon: Algernon Huxley, MD;  Location: [place] CV LAB;  Service: Cardiovascular;;  . TONSILLECTOMY      There were no vitals filed for this visit.  Subjective Assessment - 04/09/18 1036    Subjective  Patient reports feeling tired today. Reports wearing 3 socks today. Has been walking at home, ready to transition to [place].     Pertinent History  Pt presented yesterday to this PT clinic for evaluation but was unable to be seen due to elevated BP. See note. Pt took his BP medication this morning and will take again as scheduled at 3pm, per pt. Pt is a 67 y/o M s/p R BKA 08/28/17 who presents to therapy for gait training with prosthesis. Pt is wearing his shrinker 24/7. Pt reports he received his prosthesis 2 weeks ago  and was told not to use it until he started PT. Pt has not received PT since his BKA. Pt reports he has phantom pain at night but not during the day, pain as high as 9/10. Pt has been ambulating up to 100 yards with RW. Pt reports 1 fall shortly after R BKA but no falls since. Pt is independent with dressing, bathing (gets into tub to take a bath), driving. Goal: to be able to walk with prosthesis. Pt denies any issues getting up his steps, hops up one step at a time.     Limitations  Walking;Standing    How long  can you walk comfortably?  100 yards with RW    Patient Stated Goals  to ambulate with prosthesis and return to work    Currently in Pain?  No/denies     Vitals: 138/55 pulse 78    Progress notes 5x STS : 11 seconds with UE support 12 seconds no UE support  R knee F and E ROM: extension -6 flexion 112 Stand one hour  10 MWT. 9 seconds with RW x2 trials; 24 seconds with QC x2 trials  TUG: 19 seconds with RW due to confusion , 13 seconds with RW; 20 seconds with QC;    Bridge 10x ; 2 sets Min A assistance with placement of R foot and maintain placement of feet.   6 min walk test: 6 ft with RW and CGA    Pt. response to medical necessity:    Pt will benefit from skilled PT interventions for improved gait mechanics and improved ROM and strength                           PT Education - 04/09/18 1100    Education provided  Yes    Education Details  goal progression, POC, ambulation with QC     Person(s) Educated  Patient    Methods  Explanation;Demonstration;Verbal cues    Comprehension  Verbalized understanding;Returned demonstration       PT Short Term Goals - 04/09/18 1058      PT SHORT TERM GOAL #1   Title  Pt will independently complete HEP at least 4 days/wk for improved carryover between sessions    Baseline  HEP compliant    Time  2    Period  Weeks    Status  Achieved      PT SHORT TERM GOAL #2   Title  Patient (> 62 years old) will complete five times sit to stand test in < 15 seconds indicating an increased LE strength and improved balance.    Baseline  5/30: 18 seconds; 6/26:  12 seconds no UE support     Time  2    Period  Weeks    Status  Achieved        PT Long Term Goals - 04/09/18 1037      PT LONG TERM GOAL #1   Title  Pt's R knee F and E will improve to WNL for improved gait mechanics    Baseline  F: 98  E: -11 ; 6/26: F 112, E-6     Time  6    Period  Weeks    Status  Partially Met    Target Date  05/21/18      PT  LONG TERM GOAL #2   Title  Pt will be able to stand for 1 hour to demonstrate improved endurance in standing  with goal of eventual return to work    Baseline  Needs to 20ish minutes with AD     Time  6    Period  Weeks    Status  Partially Met    Target Date  05/21/18      PT LONG TERM GOAL #3   Title  Pt will demonstrate safe technique with sit<>stand transfers 5/5x for improved safety and independence    Baseline  6/26: 5x STS without UE support 12 seconds safely     Time  3    Period  Weeks    Status  Achieved      PT LONG TERM GOAL #4   Title  Patient will increase 10 meter walk test to >1.23ms as to improve gait speed for better community ambulation and to reduce fall risk    Baseline  5/30=.63 m/s with RW  6/26: 1.1 m/s with RW .42 m/s with QC     Time  6    Period  Weeks    Status  Partially Met    Target Date  05/21/18      PT LONG TERM GOAL #5   Title  Patient will reduce timed up and go to <11 seconds to reduce fall risk and demonstrate improved transfer/gait ability.    Baseline  5/30: 25.21 seconds 6/26: 13 seconds with RW, 20 seconds with QC     Time  6    Period  Weeks    Status  Partially Met    Target Date  05/21/18      Additional Long Term Goals   Additional Long Term Goals  Yes      PT LONG TERM GOAL #6   Title  Patient will increase six minute walk test distance to >1000 for progression to community ambulator and improve gait ability    Baseline  6/26: 820 ft with RW     Time  6    Period  Weeks    Status  New    Target Date  05/21/18            Plan - 04/09/18 1106    Clinical Impression Statement  Patient demonstrates progression / meeting all goals at this time with RW and QC. Patient transitioning towards more independent device (quad cane) from RW. 10 MWT 9 seconds with RW and 24 seconds with QC; TUG 13 seconds RW and 20 seconds with QC; 6 min walk test 820 seconds, 5x STS with 12 seconds with no UE support indicating improved LE strength and  confidence with ambulation with prosthetic limb. Patient's condition has the potential to improve in response to therapy. Maximum improvement is yet to be obtained. The anticipated improvement is attainable and reasonable in a generally predictable time. Pt will benefit from skilled PT interventions for improved gait mechanics and improved ROM and strength    Rehab Potential  Good    PT Frequency  2x / week    PT Duration  6 weeks    PT Treatment/Interventions  ADLs/Self Care Home Management;Aquatic Therapy;Biofeedback;Cryotherapy;Electrical Stimulation;Iontophoresis 442mml Dexamethasone;Moist Heat;Ultrasound;Contrast Bath;DME Instruction;Stair training;Gait training;Functional mobility training;Therapeutic activities;Therapeutic exercise;Balance training;Neuromuscular re-education;Patient/family education;Prosthetic Training;Manual techniques;Wheelchair moInvestment banker, operationalompression bandaging;Passive range of motion;Dry needling;Energy conservation;Taping    PT Next Visit Plan  walk with Qc, walk with cane    PT Home Exercise Plan  R knee presses with overpressure from wife, densistization light squeezes to distal LLE, LAQ, ambulating in home, prone R hip flexor stretch with pillow x3 minutes  Consulted and Agree with Plan of Care  Patient;Family member/caregiver       Patient will benefit from skilled therapeutic intervention in order to improve the following deficits and impairments:  Abnormal gait, Decreased activity tolerance, Decreased balance, Decreased endurance, Decreased coordination, Decreased knowledge of use of DME, Decreased mobility, Decreased range of motion, Decreased safety awareness, Decreased strength, Difficulty walking, Hypomobility, Increased fascial restricitons, Increased muscle spasms, Impaired perceived functional ability, Impaired flexibility, Impaired sensation, Improper body mechanics, Postural dysfunction, Prosthetic Dependency, Pain  Visit  Diagnosis: Other abnormalities of gait and mobility  Unsteadiness on feet  Muscle weakness (generalized)     Problem List Patient Active Problem List   Diagnosis Date Noted  . Peripheral vascular disease ([place]) 10/22/2017  . Hx of BKA, right ([place]) 09/30/2017  . Gangrene from atherosclerosis, extremities ([place]) 08/25/2017  . Ischemic leg 08/15/2017  . HLD (hyperlipidemia) 07/16/2017  . Hypertension 06/28/2017  . Tobacco use disorder 06/28/2017  . Atherosclerosis of native arteries of the extremities with ulceration ([place]) 06/28/2017   Janna Arch, PT, DPT   04/09/2018, 11:07 AM  [place] MAIN [facility] SERVICES [address] [place], [place], [address] Phone: [phone]   Fax:  [phone]  Name: Jordan Mills MRN: [id number] Date of Birth: [birth year]

## 2018-04-16 ENCOUNTER — Ambulatory Visit: Payer: Medicare Other | Attending: Vascular Surgery

## 2018-04-16 DIAGNOSIS — M6281 Muscle weakness (generalized): Secondary | ICD-10-CM | POA: Insufficient documentation

## 2018-04-16 DIAGNOSIS — R2681 Unsteadiness on feet: Secondary | ICD-10-CM | POA: Diagnosis not present

## 2018-04-16 DIAGNOSIS — R2689 Other abnormalities of gait and mobility: Secondary | ICD-10-CM | POA: Diagnosis not present

## 2018-04-16 NOTE — Therapy (Signed)
[place] MAIN [facility] SERVICES [address] [place], [place], [address] Phone: [phone]   Fax:  [phone]  Physical Therapy Treatment  Patient Details  Name: Jordan Mills MRN: [id number] Date of Birth: November 12, 1950 Referring Provider: Sela Hua, PA-C   Encounter Date: 04/16/2018  PT End of Session - 04/16/18 1020    Visit Number  11    Number of Visits  25    Date for PT Re-Evaluation  05/21/18    Authorization Type  1/10 start date 04/09/18    PT Start Time  1015    PT Stop Time  1059    PT Time Calculation (min)  44 min    Equipment Utilized During Treatment  Gait belt    Activity Tolerance  Patient tolerated treatment well;Treatment limited secondary to medical complications (Comment) elevated BP    Behavior During Therapy  [facility] for tasks assessed/performed       Past Medical History:  Diagnosis Date  . Gout   . HLD (hyperlipidemia)   . Hypertension   . Peripheral vascular disease [facility])     Past Surgical History:  Procedure Laterality Date  . AMPUTATION Right 08/28/2017   Procedure: AMPUTATION BELOW KNEE;  Surgeon: Algernon Huxley, MD;  Location: [facility] ORS;  Service: Vascular;  Laterality: Right;  . AMPUTATION TOE Right 07/19/2017   Procedure: AMPUTATION TOE/Rt 4th MPJ;  Surgeon: Sharlotte Alamo, DPM;  Location: [facility] ORS;  Service: Podiatry;  Laterality: Right;  . LOWER EXTREMITY ANGIOGRAPHY Right 07/01/2017   Procedure: Lower Extremity Angiography;  Surgeon: Algernon Huxley, MD;  Location: [place] CV LAB;  Service: Cardiovascular;  Laterality: Right;  . LOWER EXTREMITY ANGIOGRAPHY Right 08/15/2017   Procedure: Lower Extremity Angiography;  Surgeon: Algernon Huxley, MD;  Location: [place] CV LAB;  Service: Cardiovascular;  Laterality: Right;  . LOWER EXTREMITY ANGIOGRAPHY Right 08/16/2017   Procedure: LOWER EXTREMITY ANGIOGRAPHY;  Surgeon: Algernon Huxley, MD;  Location: [place] CV LAB;  Service: Cardiovascular;   Laterality: Right;  . LOWER EXTREMITY INTERVENTION  08/15/2017   Procedure: LOWER EXTREMITY INTERVENTION;  Surgeon: Algernon Huxley, MD;  Location: [place] CV LAB;  Service: Cardiovascular;;  . TONSILLECTOMY      There were no vitals filed for this visit.  Subjective Assessment - 04/16/18 1017    Subjective  Patient reports pain in residual limb in stitches area. Nothing makes it better or worse, is constant.     Pertinent History  Pt presented yesterday to this PT clinic for evaluation but was unable to be seen due to elevated BP. See note. Pt took his BP medication this morning and will take again as scheduled at 3pm, per pt. Pt is a 67 y/o M s/p R BKA 08/28/17 who presents to therapy for gait training with prosthesis. Pt is wearing his shrinker 24/7. Pt reports he received his prosthesis 2 weeks ago and was told not to use it until he started PT. Pt has not received PT since his BKA. Pt reports he has phantom pain at night but not during the day, pain as high as 9/10. Pt has been ambulating up to 100 yards with RW. Pt reports 1 fall shortly after R BKA but no falls since. Pt is independent with dressing, bathing (gets into tub to take a bath), driving. Goal: to be able to walk with prosthesis. Pt denies any issues getting up his steps, hops up one step at a time.  Limitations  Walking;Standing    How long can you walk comfortably?  100 yards with RW    Patient Stated Goals  to ambulate with prosthesis and return to work    Currently in Pain?  Yes    Pain Score  6     Pain Location  Other (Comment) R residual limb    Pain Orientation  Right    Pain Descriptors / Indicators  Aching    Pain Type  Acute pain    Pain Onset  In the past 7 days    Pain Frequency  Constant    Aggravating Factors   stays the same      Vitals : 141/64 pulse 74  Analysis/observation of residual limb to assess for swelling, heat, redness R side pain , no bruising, slight increase in heat, recommend patient go  to doctor if pain persists   Prone hip flexor stretch RLE 2x60 seconds  Prone hip extension with bent knee 12x each leg 3 second hold    Education on QC, ambulate with QC with CGA and verbal cueing for sequencing in gym (~96 ft) cues for sequencing : cane right left; and for widening BOS  Ambulate 200 ft with one standing rest break with QC and CGA cues for widening BOS and sequencing of cane, right , left. Left foot drag with fatigue  Sit to stand 10x from plinth table.   Seated abduction RTB one leg at a time 10x each leg, initially require tactile cueing to not move feet and only move one leg at a time.   Seated marches RTB 15x, cues for no UE support and abdominal activation ; PT foot placed between patients feet to promote wider BOS  Seated LAQ 10x 3 second hold                    PT Education - 04/16/18 1020    Education provided  Yes    Education Details  following pain in residual limb, manual, exercise technique     Person(s) Educated  Patient    Methods  Explanation    Comprehension  Verbalized understanding       PT Short Term Goals - 04/09/18 1058      PT SHORT TERM GOAL #1   Title  Pt will independently complete HEP at least 4 days/wk for improved carryover between sessions    Baseline  HEP compliant    Time  2    Period  Weeks    Status  Achieved      PT SHORT TERM GOAL #2   Title  Patient (> 52 years old) will complete five times sit to stand test in < 15 seconds indicating an increased LE strength and improved balance.    Baseline  5/30: 18 seconds; 6/26:  12 seconds no UE support     Time  2    Period  Weeks    Status  Achieved        PT Long Term Goals - 04/09/18 1037      PT LONG TERM GOAL #1   Title  Pt's R knee F and E will improve to WNL for improved gait mechanics    Baseline  F: 98  E: -11 ; 6/26: F 112, E-6     Time  6    Period  Weeks    Status  Partially Met    Target Date  05/21/18      PT LONG TERM GOAL #  2   Title   Pt will be able to stand for 1 hour to demonstrate improved endurance in standing with goal of eventual return to work    Baseline  Needs to 20ish minutes with AD     Time  6    Period  Weeks    Status  Partially Met    Target Date  05/21/18      PT LONG TERM GOAL #3   Title  Pt will demonstrate safe technique with sit<>stand transfers 5/5x for improved safety and independence    Baseline  6/26: 5x STS without UE support 12 seconds safely     Time  3    Period  Weeks    Status  Achieved      PT LONG TERM GOAL #4   Title  Patient will increase 10 meter walk test to >1.51ms as to improve gait speed for better community ambulation and to reduce fall risk    Baseline  5/30=.63 m/s with RW  6/26: 1.1 m/s with RW .42 m/s with QC     Time  6    Period  Weeks    Status  Partially Met    Target Date  05/21/18      PT LONG TERM GOAL #5   Title  Patient will reduce timed up and go to <11 seconds to reduce fall risk and demonstrate improved transfer/gait ability.    Baseline  5/30: 25.21 seconds 6/26: 13 seconds with RW, 20 seconds with QC     Time  6    Period  Weeks    Status  Partially Met    Target Date  05/21/18      Additional Long Term Goals   Additional Long Term Goals  Yes      PT LONG TERM GOAL #6   Title  Patient will increase six minute walk test distance to >1000 for progression to community ambulator and improve gait ability    Baseline  6/26: 820 ft with RW     Time  6    Period  Weeks    Status  New    Target Date  05/21/18            Plan - 04/16/18 1221    Clinical Impression Statement  Patient educated on need to keep an eye on pain in residual limb and if it persists to contact doctor about an appointment. Patient ambulating with quad cane with improved step length and fluidity of movement with additional increase in capacity for duration of ambulation. Pt will benefit from skilled PT interventions for improved gait mechanics and improved ROM and strength      Rehab Potential  Good    PT Frequency  2x / week    PT Duration  6 weeks    PT Treatment/Interventions  ADLs/Self Care Home Management;Aquatic Therapy;Biofeedback;Cryotherapy;Electrical Stimulation;Iontophoresis 449mml Dexamethasone;Moist Heat;Ultrasound;Contrast Bath;DME Instruction;Stair training;Gait training;Functional mobility training;Therapeutic activities;Therapeutic exercise;Balance training;Neuromuscular re-education;Patient/family education;Prosthetic Training;Manual techniques;Wheelchair moInvestment banker, operationalompression bandaging;Passive range of motion;Dry needling;Energy conservation;Taping    PT Next Visit Plan  walk with Qc, walk with cane    PT Home Exercise Plan  R knee presses with overpressure from wife, densistization light squeezes to distal LLE, LAQ, ambulating in home, prone R hip flexor stretch with pillow x3 minutes    Consulted and Agree with Plan of Care  Patient;Family member/caregiver       Patient will benefit from skilled therapeutic intervention in order to improve the following deficits  and impairments:  Abnormal gait, Decreased activity tolerance, Decreased balance, Decreased endurance, Decreased coordination, Decreased knowledge of use of DME, Decreased mobility, Decreased range of motion, Decreased safety awareness, Decreased strength, Difficulty walking, Hypomobility, Increased fascial restricitons, Increased muscle spasms, Impaired perceived functional ability, Impaired flexibility, Impaired sensation, Improper body mechanics, Postural dysfunction, Prosthetic Dependency, Pain  Visit Diagnosis: Other abnormalities of gait and mobility  Unsteadiness on feet  Muscle weakness (generalized)     Problem List Patient Active Problem List   Diagnosis Date Noted  . Peripheral vascular disease ([place]) 10/22/2017  . Hx of BKA, right ([place]) 09/30/2017  . Gangrene from atherosclerosis, extremities ([place]) 08/25/2017  . Ischemic leg 08/15/2017  . HLD  (hyperlipidemia) 07/16/2017  . Hypertension 06/28/2017  . Tobacco use disorder 06/28/2017  . Atherosclerosis of native arteries of the extremities with ulceration ([place]) 06/28/2017   Janna Arch, PT, DPT   04/16/2018, 12:35 PM  [place] MAIN [facility] SERVICES [address] [place], [place], [address] Phone: [phone]   Fax:  [phone]  Name: Jordan Mills MRN: [id number] Date of Birth: August 10, 1951

## 2018-04-21 ENCOUNTER — Ambulatory Visit: Payer: Medicare Other

## 2018-04-21 VITALS — BP 147/60 | HR 61

## 2018-04-21 DIAGNOSIS — R2689 Other abnormalities of gait and mobility: Secondary | ICD-10-CM | POA: Diagnosis not present

## 2018-04-21 DIAGNOSIS — R2681 Unsteadiness on feet: Secondary | ICD-10-CM

## 2018-04-21 DIAGNOSIS — M6281 Muscle weakness (generalized): Secondary | ICD-10-CM | POA: Diagnosis not present

## 2018-04-21 NOTE — Therapy (Signed)
[place] MAIN [facility] SERVICES [address] [place], [place], [address] Phone: [phone]   Fax:  [phone]  Physical Therapy Treatment  Patient Details  Name: Jordan Mills MRN: [id number] Date of Birth: 10/09/1951 Referring Provider: Sela Hua, PA-C   Encounter Date: 04/21/2018  PT End of Session - 04/21/18 0952    Visit Number  12    Number of Visits  25    Date for PT Re-Evaluation  05/21/18    Authorization Type  2/10 start date 04/09/18    PT Start Time  0930    PT Stop Time  1014    PT Time Calculation (min)  44 min    Equipment Utilized During Treatment  Gait belt    Activity Tolerance  Patient tolerated treatment well;Treatment limited secondary to medical complications (Comment) elevated BP    Behavior During Therapy  [facility] for tasks assessed/performed       Past Medical History:  Diagnosis Date  . Gout   . HLD (hyperlipidemia)   . Hypertension   . Peripheral vascular disease [facility])     Past Surgical History:  Procedure Laterality Date  . AMPUTATION Right 08/28/2017   Procedure: AMPUTATION BELOW KNEE;  Surgeon: Algernon Huxley, MD;  Location: [facility] ORS;  Service: Vascular;  Laterality: Right;  . AMPUTATION TOE Right 07/19/2017   Procedure: AMPUTATION TOE/Rt 4th MPJ;  Surgeon: Sharlotte Alamo, DPM;  Location: [facility] ORS;  Service: Podiatry;  Laterality: Right;  . LOWER EXTREMITY ANGIOGRAPHY Right 07/01/2017   Procedure: Lower Extremity Angiography;  Surgeon: Algernon Huxley, MD;  Location: [place] CV LAB;  Service: Cardiovascular;  Laterality: Right;  . LOWER EXTREMITY ANGIOGRAPHY Right 08/15/2017   Procedure: Lower Extremity Angiography;  Surgeon: Algernon Huxley, MD;  Location: [place] CV LAB;  Service: Cardiovascular;  Laterality: Right;  . LOWER EXTREMITY ANGIOGRAPHY Right 08/16/2017   Procedure: LOWER EXTREMITY ANGIOGRAPHY;  Surgeon: Algernon Huxley, MD;  Location: [place] CV LAB;  Service: Cardiovascular;   Laterality: Right;  . LOWER EXTREMITY INTERVENTION  08/15/2017   Procedure: LOWER EXTREMITY INTERVENTION;  Surgeon: Algernon Huxley, MD;  Location: [place] CV LAB;  Service: Cardiovascular;;  . TONSILLECTOMY      Vitals:   04/21/18 0934  BP: (!) 147/60  Pulse: 61    Subjective Assessment - 04/21/18 0933    Subjective  Patient reports no pain today. Forgot quad cane at home but will bring next session. No complaints or concerns     Pertinent History  Pt presented yesterday to this PT clinic for evaluation but was unable to be seen due to elevated BP. See note. Pt took his BP medication this morning and will take again as scheduled at 3pm, per pt. Pt is a 67 y/o M s/p R BKA 08/28/17 who presents to therapy for gait training with prosthesis. Pt is wearing his shrinker 24/7. Pt reports he received his prosthesis 2 weeks ago and was told not to use it until he started PT. Pt has not received PT since his BKA. Pt reports he has phantom pain at night but not during the day, pain as high as 9/10. Pt has been ambulating up to 100 yards with RW. Pt reports 1 fall shortly after R BKA but no falls since. Pt is independent with dressing, bathing (gets into tub to take a bath), driving. Goal: to be able to walk with prosthesis. Pt denies any issues getting up his steps, hops up  one step at a time.     Limitations  Walking;Standing    How long can you walk comfortably?  100 yards with RW    Patient Stated Goals  to ambulate with prosthesis and return to work    Currently in Pain?  No/denies       Ambulate 200 ft with one standing rest break with QC and CGA cues for widening BOS and sequencing of cane, right , left. Left foot drag with fatigue   Balloon taps with CGA x 3 minutes no LOB  Airex pad: ball toss with CGA x 20 passes. Weighted ball (3000 Gr -yellow ball)  Airex eyes closed 4x30 seconds no UE support   Step over and back orange hurdle LUE support 15x each leg     Seated abduction RTB one  leg at a time 10x each leg, initially require tactile cueing to not move feet and only move one leg at a time.    Seated LAQ 10x 3 second hold   Standing in parallel bars: BLE toe taps on 6 inch step with  x20 reps with CGA for safety with LUE support  BLE side toe taps on 6" step x20 reps with LUE support   Standing hip extension 10x each leg SUE support - 2 sets, patient challenged with RLE maintaining SLS with straight leg  Standing SLR 10x each leg SUE support (RUE) 2 sets                          PT Education - 04/21/18 0934    Education provided  Yes    Education Details  exercise technique , ambulate with QC     Person(s) Educated  Patient    Methods  Explanation;Demonstration;Verbal cues    Comprehension  Verbalized understanding;Returned demonstration;Need further instruction       PT Short Term Goals - 04/09/18 1058      PT SHORT TERM GOAL #1   Title  Pt will independently complete HEP at least 4 days/wk for improved carryover between sessions    Baseline  HEP compliant    Time  2    Period  Weeks    Status  Achieved      PT SHORT TERM GOAL #2   Title  Patient (> 69 years old) will complete five times sit to stand test in < 15 seconds indicating an increased LE strength and improved balance.    Baseline  5/30: 18 seconds; 6/26:  12 seconds no UE support     Time  2    Period  Weeks    Status  Achieved        PT Long Term Goals - 04/09/18 1037      PT LONG TERM GOAL #1   Title  Pt's R knee F and E will improve to WNL for improved gait mechanics    Baseline  F: 98  E: -11 ; 6/26: F 112, E-6     Time  6    Period  Weeks    Status  Partially Met    Target Date  05/21/18      PT LONG TERM GOAL #2   Title  Pt will be able to stand for 1 hour to demonstrate improved endurance in standing with goal of eventual return to work    Baseline  Needs to 20ish minutes with AD     Time  6    Period  Weeks  Status  Partially Met    Target  Date  05/21/18      PT LONG TERM GOAL #3   Title  Pt will demonstrate safe technique with sit<>stand transfers 5/5x for improved safety and independence    Baseline  6/26: 5x STS without UE support 12 seconds safely     Time  3    Period  Weeks    Status  Achieved      PT LONG TERM GOAL #4   Title  Patient will increase 10 meter walk test to >1.37ms as to improve gait speed for better community ambulation and to reduce fall risk    Baseline  5/30=.63 m/s with RW  6/26: 1.1 m/s with RW .42 m/s with QC     Time  6    Period  Weeks    Status  Partially Met    Target Date  05/21/18      PT LONG TERM GOAL #5   Title  Patient will reduce timed up and go to <11 seconds to reduce fall risk and demonstrate improved transfer/gait ability.    Baseline  5/30: 25.21 seconds 6/26: 13 seconds with RW, 20 seconds with QC     Time  6    Period  Weeks    Status  Partially Met    Target Date  05/21/18      Additional Long Term Goals   Additional Long Term Goals  Yes      PT LONG TERM GOAL #6   Title  Patient will increase six minute walk test distance to >1000 for progression to community ambulator and improve gait ability    Baseline  6/26: 820 ft with RW     Time  6    Period  Weeks    Status  New    Target Date  05/21/18            Plan - 04/21/18 03785   Clinical Impression Statement  Patient challenged with keeping R residual leg extended in SLS this session with decreased attention to task with fatigue. Patient demonstrated improved task orientation as session progressed and patient became more "awake/alert". Pt will benefit from skilled PT interventions for improved gait mechanics and improved ROM and strength     Rehab Potential  Good    PT Frequency  2x / week    PT Duration  6 weeks    PT Treatment/Interventions  ADLs/Self Care Home Management;Aquatic Therapy;Biofeedback;Cryotherapy;Electrical Stimulation;Iontophoresis 448mml Dexamethasone;Moist Heat;Ultrasound;Contrast  Bath;DME Instruction;Stair training;Gait training;Functional mobility training;Therapeutic activities;Therapeutic exercise;Balance training;Neuromuscular re-education;Patient/family education;Prosthetic Training;Manual techniques;Wheelchair moInvestment banker, operationalompression bandaging;Passive range of motion;Dry needling;Energy conservation;Taping    PT Next Visit Plan  walk with Qc, walk with cane    PT Home Exercise Plan  R knee presses with overpressure from wife, densistization light squeezes to distal LLE, LAQ, ambulating in home, prone R hip flexor stretch with pillow x3 minutes    Consulted and Agree with Plan of Care  Patient;Family member/caregiver       Patient will benefit from skilled therapeutic intervention in order to improve the following deficits and impairments:  Abnormal gait, Decreased activity tolerance, Decreased balance, Decreased endurance, Decreased coordination, Decreased knowledge of use of DME, Decreased mobility, Decreased range of motion, Decreased safety awareness, Decreased strength, Difficulty walking, Hypomobility, Increased fascial restricitons, Increased muscle spasms, Impaired perceived functional ability, Impaired flexibility, Impaired sensation, Improper body mechanics, Postural dysfunction, Prosthetic Dependency, Pain  Visit Diagnosis: Other abnormalities of gait and mobility  Unsteadiness  on feet  Muscle weakness (generalized)     Problem List Patient Active Problem List   Diagnosis Date Noted  . Peripheral vascular disease ([place]) 10/22/2017  . Hx of BKA, right ([place]) 09/30/2017  . Gangrene from atherosclerosis, extremities ([place]) 08/25/2017  . Ischemic leg 08/15/2017  . HLD (hyperlipidemia) 07/16/2017  . Hypertension 06/28/2017  . Tobacco use disorder 06/28/2017  . Atherosclerosis of native arteries of the extremities with ulceration ([place]) 06/28/2017   Janna Arch, PT, DPT   04/21/2018, 10:14 AM  [place] MAIN [facility] SERVICES [address] [place], [place], [address] Phone: [phone]   Fax:  [phone]  Name: Jordan Mills MRN: [id number] Date of Birth: [birth year]

## 2018-04-22 ENCOUNTER — Ambulatory Visit (INDEPENDENT_AMBULATORY_CARE_PROVIDER_SITE_OTHER): Payer: Medicare Other | Admitting: Vascular Surgery

## 2018-04-22 ENCOUNTER — Encounter (INDEPENDENT_AMBULATORY_CARE_PROVIDER_SITE_OTHER): Payer: Medicare Other

## 2018-04-23 ENCOUNTER — Ambulatory Visit: Payer: Medicare Other

## 2018-04-23 VITALS — BP 126/63 | HR 65

## 2018-04-23 DIAGNOSIS — M6281 Muscle weakness (generalized): Secondary | ICD-10-CM | POA: Diagnosis not present

## 2018-04-23 DIAGNOSIS — R2689 Other abnormalities of gait and mobility: Secondary | ICD-10-CM

## 2018-04-23 DIAGNOSIS — R2681 Unsteadiness on feet: Secondary | ICD-10-CM | POA: Diagnosis not present

## 2018-04-23 NOTE — Therapy (Signed)
[place] MAIN [facility] SERVICES [address] [place], [place], [address] Phone: [phone]   Fax:  [phone]  Physical Therapy Treatment  Patient Details  Name: Jordan Mills MRN: [id number] Date of Birth: [birth year] Referring Provider: Sela Hua, PA-C   Encounter Date: 04/23/2018  PT End of Session - 04/23/18 1409    Visit Number  13    Number of Visits  25    Date for PT Re-Evaluation  05/21/18    Authorization Type  3/10 start date 04/09/18    PT Start Time  1346    PT Stop Time  1429    PT Time Calculation (min)  43 min    Equipment Utilized During Treatment  Gait belt    Activity Tolerance  Patient tolerated treatment well;Treatment limited secondary to medical complications (Comment) elevated BP    Behavior During Therapy  [facility] for tasks assessed/performed       Past Medical History:  Diagnosis Date  . Gout   . HLD (hyperlipidemia)   . Hypertension   . Peripheral vascular disease [facility])     Past Surgical History:  Procedure Laterality Date  . AMPUTATION Right 08/28/2017   Procedure: AMPUTATION BELOW KNEE;  Surgeon: Algernon Huxley, MD;  Location: [facility] ORS;  Service: Vascular;  Laterality: Right;  . AMPUTATION TOE Right 07/19/2017   Procedure: AMPUTATION TOE/Rt 4th MPJ;  Surgeon: Sharlotte Alamo, DPM;  Location: [facility] ORS;  Service: Podiatry;  Laterality: Right;  . LOWER EXTREMITY ANGIOGRAPHY Right 07/01/2017   Procedure: Lower Extremity Angiography;  Surgeon: Algernon Huxley, MD;  Location: [place] CV LAB;  Service: Cardiovascular;  Laterality: Right;  . LOWER EXTREMITY ANGIOGRAPHY Right 08/15/2017   Procedure: Lower Extremity Angiography;  Surgeon: Algernon Huxley, MD;  Location: [place] CV LAB;  Service: Cardiovascular;  Laterality: Right;  . LOWER EXTREMITY ANGIOGRAPHY Right 08/16/2017   Procedure: LOWER EXTREMITY ANGIOGRAPHY;  Surgeon: Algernon Huxley, MD;  Location: [place] CV LAB;  Service: Cardiovascular;   Laterality: Right;  . LOWER EXTREMITY INTERVENTION  08/15/2017   Procedure: LOWER EXTREMITY INTERVENTION;  Surgeon: Algernon Huxley, MD;  Location: [place] CV LAB;  Service: Cardiovascular;;  . TONSILLECTOMY      Vitals:   04/23/18 1350  BP: 126/63  Pulse: 65     Subjective Assessment - 04/23/18 1349    Subjective  Patient presents with QC to todays. Session [place] walk[place] the mall but uses quad cane at home. No aches or pains reported.     Pertinent History  Pt presented yesterday to this PT clinic for evaluation but was unable to be seen due to elevated BP. See note. Pt took his BP medication this morning and will take again as scheduled at 3pm, per pt. Pt is a 66 y/o M s/p R BKA 08/28/17 who presents to therapy for gait training with prosthesis. Pt is wearing his shrinker 24/7. Pt reports he received his prosthesis 2 weeks ago and was told not to use it until he started PT. Pt has not received PT since his BKA. Pt reports he has phantom pain at night but not during the day, pain as high as 9/10. Pt has been ambulating up to 100 yards with RW. Pt reports 1 fall shortly after R BKA but no falls since. Pt is independent with dressing, bathing (gets into tub to take a bath), driving. Goal: to be able to walk with prosthesis. Pt denies any issues  getting up his steps, hops up one step at a time.     Limitations  Walking;Standing    How long can you walk comfortably?  100 yards with RW    Patient Stated Goals  to ambulate with prosthesis and return to work    Currently in Pain?  No/denies       Adjust QC (patient's personal cane) for correct height: ambulated 96 ft around gym with improved step length and arm swing  Ambulate in // bars with decreased UE assistance able to perform without any UE support x8 lengths of bars without LOB; cues for widening BOS   Step clap and step back in // bars 15x ; no UE support  Balloon taps with CGA x 3 minutes no LOB  Weighted ball (2000  G) overhead raises with arms straight standing in // bars,   Weighted ball (2000 G) twists standing in // bars  Seated hamstring stretch 4x30 seconds with leg on 6" step   Ambulate with QC around 6 cones x 2 trials with no LOB, slight increase in L knee bend resulting in decreased weight acceptance  STS from plinth table 10x   Seated LAQ 12x 3 second holds   Seated GTB abduction 10x ; one leg at a time ; 2 sets                              PT Education - 04/23/18 1404    Education provided  Yes    Education Details  ambulate without AD in // bars, decreasing UE support     Person(s) Educated  Patient    Methods  Explanation;Demonstration;Verbal cues    Comprehension  Verbalized understanding;Returned demonstration       PT Short Term Goals - 04/09/18 1058      PT SHORT TERM GOAL #1   Title  Pt will independently complete HEP at least 4 days/wk for improved carryover between sessions    Baseline  HEP compliant    Time  2    Period  Weeks    Status  Achieved      PT SHORT TERM GOAL #2   Title  Patient (> 107 years old) will complete five times sit to stand test in < 15 seconds indicating an increased LE strength and improved balance.    Baseline  5/30: 18 seconds; 6/26:  12 seconds no UE support     Time  2    Period  Weeks    Status  Achieved        PT Long Term Goals - 04/09/18 1037      PT LONG TERM GOAL #1   Title  Pt's R knee F and E will improve to WNL for improved gait mechanics    Baseline  F: 98  E: -11 ; 6/26: F 112, E-6     Time  6    Period  Weeks    Status  Partially Met    Target Date  05/21/18      PT LONG TERM GOAL #2   Title  Pt will be able to stand for 1 hour to demonstrate improved endurance in standing with goal of eventual return to work    Baseline  Needs to 20ish minutes with AD     Time  6    Period  Weeks    Status  Partially Met    Target Date  05/21/18  PT LONG TERM GOAL #3   Title  Pt will  demonstrate safe technique with sit<>stand transfers 5/5x for improved safety and independence    Baseline  6/26: 5x STS without UE support 12 seconds safely     Time  3    Period  Weeks    Status  Achieved      PT LONG TERM GOAL #4   Title  Patient will increase 10 meter walk test to >1.44ms as to improve gait speed for better community ambulation and to reduce fall risk    Baseline  5/30=.63 m/s with RW  6/26: 1.1 m/s with RW .42 m/s with QC     Time  6    Period  Weeks    Status  Partially Met    Target Date  05/21/18      PT LONG TERM GOAL #5   Title  Patient will reduce timed up and go to <11 seconds to reduce fall risk and demonstrate improved transfer/gait ability.    Baseline  5/30: 25.21 seconds 6/26: 13 seconds with RW, 20 seconds with QC     Time  6    Period  Weeks    Status  Partially Met    Target Date  05/21/18      Additional Long Term Goals   Additional Long Term Goals  Yes      PT LONG TERM GOAL #6   Title  Patient will increase six minute walk test distance to >1000 for progression to community ambulator and improve gait ability    Baseline  6/26: 820 ft with RW     Time  6    Period  Weeks    Status  New    Target Date  05/21/18            Plan - 04/23/18 1415    Clinical Impression Statement  Patient ambulated without AD or UE support for first time this session. Gait mechanics continue to require cueing for widening BOS. Patient demonstrates improved stability with dynamic and static mobility with decreased LOB throughout session. Pt will benefit from skilled PT interventions for improved gait mechanics and improved ROM and strength     Rehab Potential  Good    PT Frequency  2x / week    PT Duration  6 weeks    PT Treatment/Interventions  ADLs/Self Care Home Management;Aquatic Therapy;Biofeedback;Cryotherapy;Electrical Stimulation;Iontophoresis 457mml Dexamethasone;Moist Heat;Ultrasound;Contrast Bath;DME Instruction;Stair training;Gait  training;Functional mobility training;Therapeutic activities;Therapeutic exercise;Balance training;Neuromuscular re-education;Patient/family education;Prosthetic Training;Manual techniques;Wheelchair moInvestment banker, operationalompression bandaging;Passive range of motion;Dry needling;Energy conservation;Taping    PT Next Visit Plan  walk with Qc, walk with cane    PT Home Exercise Plan  R knee presses with overpressure from wife, densistization light squeezes to distal LLE, LAQ, ambulating in home, prone R hip flexor stretch with pillow x3 minutes    Consulted and Agree with Plan of Care  Patient;Family member/caregiver       Patient will benefit from skilled therapeutic intervention in order to improve the following deficits and impairments:  Abnormal gait, Decreased activity tolerance, Decreased balance, Decreased endurance, Decreased coordination, Decreased knowledge of use of DME, Decreased mobility, Decreased range of motion, Decreased safety awareness, Decreased strength, Difficulty walking, Hypomobility, Increased fascial restricitons, Increased muscle spasms, Impaired perceived functional ability, Impaired flexibility, Impaired sensation, Improper body mechanics, Postural dysfunction, Prosthetic Dependency, Pain  Visit Diagnosis: Other abnormalities of gait and mobility  Unsteadiness on feet  Muscle weakness (generalized)     Problem List Patient  Active Problem List   Diagnosis Date Noted  . Peripheral vascular disease ([place]) 10/22/2017  . Hx of BKA, right ([place]) 09/30/2017  . Gangrene from atherosclerosis, extremities ([place]) 08/25/2017  . Ischemic leg 08/15/2017  . HLD (hyperlipidemia) 07/16/2017  . Hypertension 06/28/2017  . Tobacco use disorder 06/28/2017  . Atherosclerosis of native arteries of the extremities with ulceration ([place]) 06/28/2017   Janna Arch, PT, DPT   04/23/2018, 2:30 PM  [place] MAIN [facility] SERVICES [address] [place], [place], [address] Phone: [phone]   Fax:  [phone]  Name: Jordan Mills MRN: [id number] Date of Birth: 1950-10-27

## 2018-04-28 ENCOUNTER — Ambulatory Visit: Payer: Medicare Other

## 2018-04-30 ENCOUNTER — Ambulatory Visit: Payer: Medicare Other

## 2018-04-30 DIAGNOSIS — M6281 Muscle weakness (generalized): Secondary | ICD-10-CM | POA: Diagnosis not present

## 2018-04-30 DIAGNOSIS — R2689 Other abnormalities of gait and mobility: Secondary | ICD-10-CM | POA: Diagnosis not present

## 2018-04-30 DIAGNOSIS — R2681 Unsteadiness on feet: Secondary | ICD-10-CM | POA: Diagnosis not present

## 2018-04-30 NOTE — Therapy (Signed)
[place] MAIN [facility] SERVICES [address] [place], [place], [address] Phone: [phone]   Fax:  [phone]  Physical Therapy Treatment  Patient Details  Name: Jordan Mills MRN: [id number] Date of Birth: 1951-03-02 Referring Provider: Sela Hua, PA-C   Encounter Date: 04/30/2018  PT End of Session - 04/30/18 0934    Visit Number  14    Number of Visits  25    Date for PT Re-Evaluation  05/21/18    Authorization Type  4/10 start date 04/09/18    PT Start Time  0930    PT Stop Time  1014    PT Time Calculation (min)  44 min    Equipment Utilized During Treatment  Gait belt    Activity Tolerance  Patient tolerated treatment well;Treatment limited secondary to medical complications (Comment) elevated BP    Behavior During Therapy  [facility] for tasks assessed/performed       Past Medical History:  Diagnosis Date  . Gout   . HLD (hyperlipidemia)   . Hypertension   . Peripheral vascular disease [facility])     Past Surgical History:  Procedure Laterality Date  . AMPUTATION Right 08/28/2017   Procedure: AMPUTATION BELOW KNEE;  Surgeon: Algernon Huxley, MD;  Location: [facility] ORS;  Service: Vascular;  Laterality: Right;  . AMPUTATION TOE Right 07/19/2017   Procedure: AMPUTATION TOE/Rt 4th MPJ;  Surgeon: Sharlotte Alamo, DPM;  Location: [facility] ORS;  Service: Podiatry;  Laterality: Right;  . LOWER EXTREMITY ANGIOGRAPHY Right 07/01/2017   Procedure: Lower Extremity Angiography;  Surgeon: Algernon Huxley, MD;  Location: [place] CV LAB;  Service: Cardiovascular;  Laterality: Right;  . LOWER EXTREMITY ANGIOGRAPHY Right 08/15/2017   Procedure: Lower Extremity Angiography;  Surgeon: Algernon Huxley, MD;  Location: [place] CV LAB;  Service: Cardiovascular;  Laterality: Right;  . LOWER EXTREMITY ANGIOGRAPHY Right 08/16/2017   Procedure: LOWER EXTREMITY ANGIOGRAPHY;  Surgeon: Algernon Huxley, MD;  Location: [place] CV LAB;  Service: Cardiovascular;   Laterality: Right;  . LOWER EXTREMITY INTERVENTION  08/15/2017   Procedure: LOWER EXTREMITY INTERVENTION;  Surgeon: Algernon Huxley, MD;  Location: [place] CV LAB;  Service: Cardiovascular;;  . TONSILLECTOMY      There were no vitals filed for this visit.  Subjective Assessment - 04/30/18 0932    Subjective  Patient reports missing last session due to being sick. Had a stomach virus of some sort. Yolanda Bonine has the same illness. Patient reports not moving much while he was sick.     Pertinent History  Pt presented yesterday to this PT clinic for evaluation but was unable to be seen due to elevated BP. See note. Pt took his BP medication this morning and will take again as scheduled at 3pm, per pt. Pt is a 67 y/o M s/p R BKA 08/28/17 who presents to therapy for gait training with prosthesis. Pt is wearing his shrinker 24/7. Pt reports he received his prosthesis 2 weeks ago and was told not to use it until he started PT. Pt has not received PT since his BKA. Pt reports he has phantom pain at night but not during the day, pain as high as 9/10. Pt has been ambulating up to 100 yards with RW. Pt reports 1 fall shortly after R BKA but no falls since. Pt is independent with dressing, bathing (gets into tub to take a bath), driving. Goal: to be able to walk with prosthesis. Pt denies any issues getting  up his steps, hops up one step at a time.     Limitations  Walking;Standing    How long can you walk comfortably?  100 yards with RW    Patient Stated Goals  to ambulate with prosthesis and return to work    Currently in Pain?  No/denies      124/70 pulse 59     Ambulate in // bars with decreased UE assistance able to perform without any UE support x8 lengths of bars without LOB; cues for widening BOS   Ambulate without AD in gym ~96 ft with CGA and cues for widening BOS   Ambulate without AD in hallway 200 ft with CGA and cues for widening BOS     Negotiate up and down stairs with CGA, initially  double UE support, decreased to SUE support with step over step reciprocating pattern.   Step clap and step back in // bars 15x ; no UE support   Balloon taps reaching inside and outside BOS x 3 minutes no LOB  Step over and back orange hurdle SUE support 10x each leg.      Seated hamstring stretch 4x30 seconds with leg on 6" step   Scapular retractions seated 10x   Seated Adduction squeeze ball 10x 3 second holds     Seated LAQ 12x 3 second holds    Seated GTB abduction 10x ; one leg at a time ; 2 sets   GTB seated marches 10x each leg. ; cues for keeping feet wide.                       PT Education - 04/30/18 0934    Education provided  Yes    Education Details  ambulate without AD, exercise technique     Person(s) Educated  Patient    Methods  Explanation;Demonstration;Verbal cues    Comprehension  Verbalized understanding;Returned demonstration       PT Short Term Goals - 04/09/18 1058      PT SHORT TERM GOAL #1   Title  Pt will independently complete HEP at least 4 days/wk for improved carryover between sessions    Baseline  HEP compliant    Time  2    Period  Weeks    Status  Achieved      PT SHORT TERM GOAL #2   Title  Patient (> 69 years old) will complete five times sit to stand test in < 15 seconds indicating an increased LE strength and improved balance.    Baseline  5/30: 18 seconds; 6/26:  12 seconds no UE support     Time  2    Period  Weeks    Status  Achieved        PT Long Term Goals - 04/09/18 1037      PT LONG TERM GOAL #1   Title  Pt's R knee F and E will improve to WNL for improved gait mechanics    Baseline  F: 98  E: -11 ; 6/26: F 112, E-6     Time  6    Period  Weeks    Status  Partially Met    Target Date  05/21/18      PT LONG TERM GOAL #2   Title  Pt will be able to stand for 1 hour to demonstrate improved endurance in standing with goal of eventual return to work    Baseline  Needs to 20ish minutes with AD  Time  6    Period  Weeks    Status  Partially Met    Target Date  05/21/18      PT LONG TERM GOAL #3   Title  Pt will demonstrate safe technique with sit<>stand transfers 5/5x for improved safety and independence    Baseline  6/26: 5x STS without UE support 12 seconds safely     Time  3    Period  Weeks    Status  Achieved      PT LONG TERM GOAL #4   Title  Patient will increase 10 meter walk test to >1.17ms as to improve gait speed for better community ambulation and to reduce fall risk    Baseline  5/30=.63 m/s with RW  6/26: 1.1 m/s with RW .42 m/s with QC     Time  6    Period  Weeks    Status  Partially Met    Target Date  05/21/18      PT LONG TERM GOAL #5   Title  Patient will reduce timed up and go to <11 seconds to reduce fall risk and demonstrate improved transfer/gait ability.    Baseline  5/30: 25.21 seconds 6/26: 13 seconds with RW, 20 seconds with QC     Time  6    Period  Weeks    Status  Partially Met    Target Date  05/21/18      Additional Long Term Goals   Additional Long Term Goals  Yes      PT LONG TERM GOAL #6   Title  Patient will increase six minute walk test distance to >1000 for progression to community ambulator and improve gait ability    Baseline  6/26: 820 ft with RW     Time  6    Period  Weeks    Status  New    Target Date  05/21/18            Plan - 04/30/18 0953    Clinical Impression Statement  Patient ambulated longer durations without AD in hallway and therapy room with cueing for widening BOS. Patient able to negotiate steps with SUE support at this time increasing his functional ability to negotiate natural environment. Pt will benefit from skilled PT interventions for improved gait mechanics and improved ROM and strength     Rehab Potential  Good    PT Frequency  2x / week    PT Duration  6 weeks    PT Treatment/Interventions  ADLs/Self Care Home Management;Aquatic Therapy;Biofeedback;Cryotherapy;Electrical  Stimulation;Iontophoresis 454mml Dexamethasone;Moist Heat;Ultrasound;Contrast Bath;DME Instruction;Stair training;Gait training;Functional mobility training;Therapeutic activities;Therapeutic exercise;Balance training;Neuromuscular re-education;Patient/family education;Prosthetic Training;Manual techniques;Wheelchair moInvestment banker, operationalompression bandaging;Passive range of motion;Dry needling;Energy conservation;Taping    PT Next Visit Plan  walk with Qc, walk with cane    PT Home Exercise Plan  R knee presses with overpressure from wife, densistization light squeezes to distal LLE, LAQ, ambulating in home, prone R hip flexor stretch with pillow x3 minutes    Consulted and Agree with Plan of Care  Patient;Family member/caregiver       Patient will benefit from skilled therapeutic intervention in order to improve the following deficits and impairments:  Abnormal gait, Decreased activity tolerance, Decreased balance, Decreased endurance, Decreased coordination, Decreased knowledge of use of DME, Decreased mobility, Decreased range of motion, Decreased safety awareness, Decreased strength, Difficulty walking, Hypomobility, Increased fascial restricitons, Increased muscle spasms, Impaired perceived functional ability, Impaired flexibility, Impaired sensation, Improper body mechanics, Postural dysfunction,  Prosthetic Dependency, Pain  Visit Diagnosis: Unsteadiness on feet  Muscle weakness (generalized)     Problem List Patient Active Problem List   Diagnosis Date Noted  . Peripheral vascular disease ([place]) 10/22/2017  . Hx of BKA, right ([place]) 09/30/2017  . Gangrene from atherosclerosis, extremities ([place]) 08/25/2017  . Ischemic leg 08/15/2017  . HLD (hyperlipidemia) 07/16/2017  . Hypertension 06/28/2017  . Tobacco use disorder 06/28/2017  . Atherosclerosis of native arteries of the extremities with ulceration ([place]) 06/28/2017   Janna Arch, PT, DPT   04/30/2018, 10:18  AM  [place] MAIN [facility] SERVICES [address] [place], [place], [address] Phone: [phone]   Fax:  [phone]  Name: Jordan Mills MRN: [id number] Date of Birth: 09-Sep-1951

## 2018-05-05 ENCOUNTER — Ambulatory Visit: Payer: Medicare Other

## 2018-05-05 VITALS — BP 140/70 | HR 67

## 2018-05-05 DIAGNOSIS — R2681 Unsteadiness on feet: Secondary | ICD-10-CM

## 2018-05-05 DIAGNOSIS — R2689 Other abnormalities of gait and mobility: Secondary | ICD-10-CM

## 2018-05-05 DIAGNOSIS — M6281 Muscle weakness (generalized): Secondary | ICD-10-CM | POA: Diagnosis not present

## 2018-05-05 NOTE — Therapy (Signed)
[place] MAIN [facility] SERVICES [address] [place], [place], [address] Phone: [phone]   Fax:  [phone]  Physical Therapy Treatment  Patient Details  Name: Jordan Mills MRN: [id number] Date of Birth: 29-Jul-1951 Referring Provider: Sela Hua, PA-C   Encounter Date: 05/05/2018  PT End of Session - 05/05/18 0947    Visit Number  15    Number of Visits  25    Date for PT Re-Evaluation  05/21/18    Authorization Type  5/10 start date 04/09/18    PT Start Time  0932    PT Stop Time  1015    PT Time Calculation (min)  43 min    Equipment Utilized During Treatment  Gait belt    Activity Tolerance  Patient tolerated treatment well;Treatment limited secondary to medical complications (Comment) elevated BP    Behavior During Therapy  [facility] for tasks assessed/performed       Past Medical History:  Diagnosis Date  . Gout   . HLD (hyperlipidemia)   . Hypertension   . Peripheral vascular disease [facility])     Past Surgical History:  Procedure Laterality Date  . AMPUTATION Right 08/28/2017   Procedure: AMPUTATION BELOW KNEE;  Surgeon: Algernon Huxley, MD;  Location: [facility] ORS;  Service: Vascular;  Laterality: Right;  . AMPUTATION TOE Right 07/19/2017   Procedure: AMPUTATION TOE/Rt 4th MPJ;  Surgeon: Sharlotte Alamo, DPM;  Location: [facility] ORS;  Service: Podiatry;  Laterality: Right;  . LOWER EXTREMITY ANGIOGRAPHY Right 07/01/2017   Procedure: Lower Extremity Angiography;  Surgeon: Algernon Huxley, MD;  Location: [place] CV LAB;  Service: Cardiovascular;  Laterality: Right;  . LOWER EXTREMITY ANGIOGRAPHY Right 08/15/2017   Procedure: Lower Extremity Angiography;  Surgeon: Algernon Huxley, MD;  Location: [place] CV LAB;  Service: Cardiovascular;  Laterality: Right;  . LOWER EXTREMITY ANGIOGRAPHY Right 08/16/2017   Procedure: LOWER EXTREMITY ANGIOGRAPHY;  Surgeon: Algernon Huxley, MD;  Location: [place] CV LAB;  Service: Cardiovascular;   Laterality: Right;  . LOWER EXTREMITY INTERVENTION  08/15/2017   Procedure: LOWER EXTREMITY INTERVENTION;  Surgeon: Algernon Huxley, MD;  Location: [place] CV LAB;  Service: Cardiovascular;;  . TONSILLECTOMY      Vitals:   05/05/18 0936  BP: 140/70  Pulse: 67    Subjective Assessment - 05/05/18 0935    Subjective  Patient went to prostheticand had his socks exchanged for a cup. Reports he was told not to walk too much with the cup on.     Pertinent History  Pt presented yesterday to this PT clinic for evaluation but was unable to be seen due to elevated BP. See note. Pt took his BP medication this morning and will take again as scheduled at 3pm, per pt. Pt is a 67 y/o M s/p R BKA 08/28/17 who presents to therapy for gait training with prosthesis. Pt is wearing his shrinker 24/7. Pt reports he received his prosthesis 2 weeks ago and was told not to use it until he started PT. Pt has not received PT since his BKA. Pt reports he has phantom pain at night but not during the day, pain as high as 9/10. Pt has been ambulating up to 100 yards with RW. Pt reports 1 fall shortly after R BKA but no falls since. Pt is independent with dressing, bathing (gets into tub to take a bath), driving. Goal: to be able to walk with prosthesis. Pt denies any issues getting up  his steps, hops up one step at a time.     Limitations  Walking;Standing    How long can you walk comfortably?  100 yards with RW    Patient Stated Goals  to ambulate with prosthesis and return to work    Currently in Pain?  No/denies       Ambulate without AD in hallway 200 ft with CGA ; decreased external rotation of device noted with improved step length.   Sit to stand lifting weighted ball (2000 Gr) 10x    Step clap and step back in // bars 15x ; no UE support   Balloon taps on Airex padreaching inside and outside BOS x 3 minutes no LOB  Airex pad: eyes closed 3x30 seconds CGA in // bars  Step over and back orange hurdle finger  tip support 15x each leg.    Side step over and back orange hurdle finger tip support 15x each leg    Seated hamstring stretch 2x40 seconds with leg on 6" step   Seated Adduction squeeze ball 10x 3 second holds    Seated hamstring curl against resistance with RLE , PT place hand of resistance at level of amputation (right below knee) 15x 3 seconds   Seated LAQ 12x 3 second holds    Seated GTB abduction 15x ; one leg at a time ; 2 sets   GTB seated marches 15x each leg. ; cues for keeping feet wide                         PT Education - 05/05/18 0947    Education provided  Yes    Education Details  ambulate without AD, exercise technique     Person(s) Educated  Patient    Methods  Explanation;Demonstration;Verbal cues    Comprehension  Verbalized understanding;Returned demonstration       PT Short Term Goals - 04/09/18 1058      PT SHORT TERM GOAL #1   Title  Pt will independently complete HEP at least 4 days/wk for improved carryover between sessions    Baseline  HEP compliant    Time  2    Period  Weeks    Status  Achieved      PT SHORT TERM GOAL #2   Title  Patient (> 67 years old) will complete five times sit to stand test in < 15 seconds indicating an increased LE strength and improved balance.    Baseline  5/30: 18 seconds; 6/26:  12 seconds no UE support     Time  2    Period  Weeks    Status  Achieved        PT Long Term Goals - 04/09/18 1037      PT LONG TERM GOAL #1   Title  Pt's R knee F and E will improve to WNL for improved gait mechanics    Baseline  F: 98  E: -11 ; 6/26: F 112, E-6     Time  6    Period  Weeks    Status  Partially Met    Target Date  05/21/18      PT LONG TERM GOAL #2   Title  Pt will be able to stand for 1 hour to demonstrate improved endurance in standing with goal of eventual return to work    Baseline  Needs to 20ish minutes with AD     Time  6  Period  Weeks    Status  Partially Met    Target Date   05/21/18      PT LONG TERM GOAL #3   Title  Pt will demonstrate safe technique with sit<>stand transfers 5/5x for improved safety and independence    Baseline  6/26: 5x STS without UE support 12 seconds safely     Time  3    Period  Weeks    Status  Achieved      PT LONG TERM GOAL #4   Title  Patient will increase 10 meter walk test to >1.74ms as to improve gait speed for better community ambulation and to reduce fall risk    Baseline  5/30=.63 m/s with RW  6/26: 1.1 m/s with RW .42 m/s with QC     Time  6    Period  Weeks    Status  Partially Met    Target Date  05/21/18      PT LONG TERM GOAL #5   Title  Patient will reduce timed up and go to <11 seconds to reduce fall risk and demonstrate improved transfer/gait ability.    Baseline  5/30: 25.21 seconds 6/26: 13 seconds with RW, 20 seconds with QC     Time  6    Period  Weeks    Status  Partially Met    Target Date  05/21/18      Additional Long Term Goals   Additional Long Term Goals  Yes      PT LONG TERM GOAL #6   Title  Patient will increase six minute walk test distance to >1000 for progression to community ambulator and improve gait ability    Baseline  6/26: 820 ft with RW     Time  6    Period  Weeks    Status  New    Target Date  05/21/18            Plan - 05/05/18 0955    Clinical Impression Statement  Patient demonstrated improved gait mechanics with no utilization of AD due to improved strength, balance, and new cup rather than socks for prosthetic fit. Patient requires less UE support for dynamic motion and is demonstrating ability to take longer steps. Pt will benefit from skilled PT interventions for improved gait mechanics and improved ROM and strength     Rehab Potential  Good    PT Frequency  2x / week    PT Duration  6 weeks    PT Treatment/Interventions  ADLs/Self Care Home Management;Aquatic Therapy;Biofeedback;Cryotherapy;Electrical Stimulation;Iontophoresis 459mml Dexamethasone;Moist  Heat;Ultrasound;Contrast Bath;DME Instruction;Stair training;Gait training;Functional mobility training;Therapeutic activities;Therapeutic exercise;Balance training;Neuromuscular re-education;Patient/family education;Prosthetic Training;Manual techniques;Wheelchair moInvestment banker, operationalompression bandaging;Passive range of motion;Dry needling;Energy conservation;Taping    PT Next Visit Plan  walk with Qc, walk with cane    PT Home Exercise Plan  R knee presses with overpressure from wife, densistization light squeezes to distal LLE, LAQ, ambulating in home, prone R hip flexor stretch with pillow x3 minutes    Consulted and Agree with Plan of Care  Patient;Family member/caregiver       Patient will benefit from skilled therapeutic intervention in order to improve the following deficits and impairments:  Abnormal gait, Decreased activity tolerance, Decreased balance, Decreased endurance, Decreased coordination, Decreased knowledge of use of DME, Decreased mobility, Decreased range of motion, Decreased safety awareness, Decreased strength, Difficulty walking, Hypomobility, Increased fascial restricitons, Increased muscle spasms, Impaired perceived functional ability, Impaired flexibility, Impaired sensation, Improper body mechanics, Postural dysfunction,  Prosthetic Dependency, Pain  Visit Diagnosis: Unsteadiness on feet  Muscle weakness (generalized)  Other abnormalities of gait and mobility     Problem List Patient Active Problem List   Diagnosis Date Noted  . Peripheral vascular disease ([place]) 10/22/2017  . Hx of BKA, right ([place]) 09/30/2017  . Gangrene from atherosclerosis, extremities ([place]) 08/25/2017  . Ischemic leg 08/15/2017  . HLD (hyperlipidemia) 07/16/2017  . Hypertension 06/28/2017  . Tobacco use disorder 06/28/2017  . Atherosclerosis of native arteries of the extremities with ulceration ([place]) 06/28/2017   Janna Arch, PT, DPT   05/05/2018, 10:15 AM  [place] MAIN [facility] SERVICES [address] [place], [place], [address] Phone: [phone]   Fax:  [phone]  Name: BRAXSTON QUINTER MRN: [id number] Date of Birth: 1950-11-28

## 2018-05-07 ENCOUNTER — Ambulatory Visit: Payer: Medicare Other

## 2018-05-07 VITALS — BP 151/52 | HR 61

## 2018-05-07 DIAGNOSIS — M6281 Muscle weakness (generalized): Secondary | ICD-10-CM | POA: Diagnosis not present

## 2018-05-07 DIAGNOSIS — R2689 Other abnormalities of gait and mobility: Secondary | ICD-10-CM | POA: Diagnosis not present

## 2018-05-07 DIAGNOSIS — R2681 Unsteadiness on feet: Secondary | ICD-10-CM

## 2018-05-07 NOTE — Therapy (Signed)
[place] MAIN [facility] SERVICES [address] [place], [place], [address] Phone: [phone]   Fax:  [phone]  Physical Therapy Treatment  Patient Details  Name: Jordan Mills MRN: [id number] Date of Birth: 1950/12/03 Referring Provider: Sela Hua, PA-C   Encounter Date: 05/07/2018  PT End of Session - 05/07/18 0946    Visit Number  16    Number of Visits  25    Date for PT Re-Evaluation  05/21/18    Authorization Type  6/10 start date 04/09/18    PT Start Time  0932    PT Stop Time  1015    PT Time Calculation (min)  43 min    Equipment Utilized During Treatment  Gait belt    Activity Tolerance  Patient tolerated treatment well;Treatment limited secondary to medical complications (Comment) elevated BP    Behavior During Therapy  [facility] for tasks assessed/performed       Past Medical History:  Diagnosis Date  . Gout   . HLD (hyperlipidemia)   . Hypertension   . Peripheral vascular disease [facility])     Past Surgical History:  Procedure Laterality Date  . AMPUTATION Right 08/28/2017   Procedure: AMPUTATION BELOW KNEE;  Surgeon: Algernon Huxley, MD;  Location: [facility] ORS;  Service: Vascular;  Laterality: Right;  . AMPUTATION TOE Right 07/19/2017   Procedure: AMPUTATION TOE/Rt 4th MPJ;  Surgeon: Sharlotte Alamo, DPM;  Location: [facility] ORS;  Service: Podiatry;  Laterality: Right;  . LOWER EXTREMITY ANGIOGRAPHY Right 07/01/2017   Procedure: Lower Extremity Angiography;  Surgeon: Algernon Huxley, MD;  Location: [place] CV LAB;  Service: Cardiovascular;  Laterality: Right;  . LOWER EXTREMITY ANGIOGRAPHY Right 08/15/2017   Procedure: Lower Extremity Angiography;  Surgeon: Algernon Huxley, MD;  Location: [place] CV LAB;  Service: Cardiovascular;  Laterality: Right;  . LOWER EXTREMITY ANGIOGRAPHY Right 08/16/2017   Procedure: LOWER EXTREMITY ANGIOGRAPHY;  Surgeon: Algernon Huxley, MD;  Location: [place] CV LAB;  Service: Cardiovascular;   Laterality: Right;  . LOWER EXTREMITY INTERVENTION  08/15/2017   Procedure: LOWER EXTREMITY INTERVENTION;  Surgeon: Algernon Huxley, MD;  Location: [place] CV LAB;  Service: Cardiovascular;;  . TONSILLECTOMY      Vitals:   05/07/18 0936  BP: (!) 151/52  Pulse: 61    Subjective Assessment - 05/07/18 0935    Subjective  Patient reports walking a lot since seen last. No pain or LOB since last session.     Pertinent History  Pt presented yesterday to this PT clinic for evaluation but was unable to be seen due to elevated BP. See note. Pt took his BP medication this morning and will take again as scheduled at 3pm, per pt. Pt is a 67 y/o M s/p R BKA 08/28/17 who presents to therapy for gait training with prosthesis. Pt is wearing his shrinker 24/7. Pt reports he received his prosthesis 2 weeks ago and was told not to use it until he started PT. Pt has not received PT since his BKA. Pt reports he has phantom pain at night but not during the day, pain as high as 9/10. Pt has been ambulating up to 100 yards with RW. Pt reports 1 fall shortly after R BKA but no falls since. Pt is independent with dressing, bathing (gets into tub to take a bath), driving. Goal: to be able to walk with prosthesis. Pt denies any issues getting up his steps, hops up one step at a  time.     Limitations  Walking;Standing    How long can you walk comfortably?  100 yards with RW    Patient Stated Goals  to ambulate with prosthesis and return to work    Currently in Pain?  No/denies       Ambulate without AD in hallway 500 ft with CGA ; decreased external rotation of device noted with improved step length. Cues for increasing BOS  Ambulate/weave around 4 cones with CGA and no AD ; 2 sets no LOB  Side stepping outside // bars 6x 4 ft each direction, CGA.    Ambulate over red mat in // bars without UE support.   Tandem stance: one leg on blue airex one leg on purple airex pad 3x30 seconds each leg in front ; 6 x total.    Airex pad: eyes closed 3x30 seconds CGA in // bars  Mini squat 10x in // bars   Lunge onto bosu ball 10x each leg SUE support  Side lunge onto bosu ball 12x each leg BUE support   Football throw in// bars with no UE support 3 minutes   Football throw in // bars on airex pad no UE support 3 minutes  Seated marches 20x , back away from chair to promote upright posture and core activation.                      PT Education - 05/07/18 0946    Education provided  Yes    Education Details  ambulate without AD, exercising with improved stability    Person(s) Educated  Patient    Methods  Explanation;Demonstration;Verbal cues    Comprehension  Verbalized understanding;Returned demonstration       PT Short Term Goals - 04/09/18 1058      PT SHORT TERM GOAL #1   Title  Pt will independently complete HEP at least 4 days/wk for improved carryover between sessions    Baseline  HEP compliant    Time  2    Period  Weeks    Status  Achieved      PT SHORT TERM GOAL #2   Title  Patient (> 75 years old) will complete five times sit to stand test in < 15 seconds indicating an increased LE strength and improved balance.    Baseline  5/30: 18 seconds; 6/26:  12 seconds no UE support     Time  2    Period  Weeks    Status  Achieved        PT Long Term Goals - 04/09/18 1037      PT LONG TERM GOAL #1   Title  Pt's R knee F and E will improve to WNL for improved gait mechanics    Baseline  F: 98  E: -11 ; 6/26: F 112, E-6     Time  6    Period  Weeks    Status  Partially Met    Target Date  05/21/18      PT LONG TERM GOAL #2   Title  Pt will be able to stand for 1 hour to demonstrate improved endurance in standing with goal of eventual return to work    Baseline  Needs to 20ish minutes with AD     Time  6    Period  Weeks    Status  Partially Met    Target Date  05/21/18      PT LONG TERM GOAL #3  Title  Pt will demonstrate safe technique with sit<>stand  transfers 5/5x for improved safety and independence    Baseline  6/26: 5x STS without UE support 12 seconds safely     Time  3    Period  Weeks    Status  Achieved      PT LONG TERM GOAL #4   Title  Patient will increase 10 meter walk test to >1.31ms as to improve gait speed for better community ambulation and to reduce fall risk    Baseline  5/30=.63 m/s with RW  6/26: 1.1 m/s with RW .42 m/s with QC     Time  6    Period  Weeks    Status  Partially Met    Target Date  05/21/18      PT LONG TERM GOAL #5   Title  Patient will reduce timed up and go to <11 seconds to reduce fall risk and demonstrate improved transfer/gait ability.    Baseline  5/30: 25.21 seconds 6/26: 13 seconds with RW, 20 seconds with QC     Time  6    Period  Weeks    Status  Partially Met    Target Date  05/21/18      Additional Long Term Goals   Additional Long Term Goals  Yes      PT LONG TERM GOAL #6   Title  Patient will increase six minute walk test distance to >1000 for progression to community ambulator and improve gait ability    Baseline  6/26: 820 ft with RW     Time  6    Period  Weeks    Status  New    Target Date  05/21/18            Plan - 05/07/18 1002    Clinical Impression Statement  Patient demonstrating improved stability with improved independence in ambulation and object negotiation. Unstable surfaces are challenging to the patient at this time and result with fatigue. Pt will benefit from skilled PT interventions for improved gait mechanics and improved ROM and strength     Rehab Potential  Good    PT Frequency  2x / week    PT Duration  6 weeks    PT Treatment/Interventions  ADLs/Self Care Home Management;Aquatic Therapy;Biofeedback;Cryotherapy;Electrical Stimulation;Iontophoresis 452mml Dexamethasone;Moist Heat;Ultrasound;Contrast Bath;DME Instruction;Stair training;Gait training;Functional mobility training;Therapeutic activities;Therapeutic exercise;Balance  training;Neuromuscular re-education;Patient/family education;Prosthetic Training;Manual techniques;Wheelchair moInvestment banker, operationalompression bandaging;Passive range of motion;Dry needling;Energy conservation;Taping    PT Next Visit Plan  walk with Qc, walk with cane    PT Home Exercise Plan  R knee presses with overpressure from wife, densistization light squeezes to distal LLE, LAQ, ambulating in home, prone R hip flexor stretch with pillow x3 minutes    Consulted and Agree with Plan of Care  Patient;Family member/caregiver       Patient will benefit from skilled therapeutic intervention in order to improve the following deficits and impairments:  Abnormal gait, Decreased activity tolerance, Decreased balance, Decreased endurance, Decreased coordination, Decreased knowledge of use of DME, Decreased mobility, Decreased range of motion, Decreased safety awareness, Decreased strength, Difficulty walking, Hypomobility, Increased fascial restricitons, Increased muscle spasms, Impaired perceived functional ability, Impaired flexibility, Impaired sensation, Improper body mechanics, Postural dysfunction, Prosthetic Dependency, Pain  Visit Diagnosis: Unsteadiness on feet  Muscle weakness (generalized)  Other abnormalities of gait and mobility     Problem List Patient Active Problem List   Diagnosis Date Noted  . Peripheral vascular disease ([place]01/05/2018  .  Hx of BKA, right ([place]) 09/30/2017  . Gangrene from atherosclerosis, extremities ([place]) 08/25/2017  . Ischemic leg 08/15/2017  . HLD (hyperlipidemia) 07/16/2017  . Hypertension 06/28/2017  . Tobacco use disorder 06/28/2017  . Atherosclerosis of native arteries of the extremities with ulceration ([place]) 06/28/2017   Janna Arch, PT, DPT   05/07/2018, 10:19 AM  [place] MAIN [facility] SERVICES [address] [place], [place], [address] Phone: [phone]   Fax:  [phone]  Name:  TRAYE BATES MRN: [id number] Date of Birth: 15-Feb-1951

## 2018-05-12 ENCOUNTER — Ambulatory Visit: Payer: Medicare Other

## 2018-05-12 VITALS — BP 144/62 | HR 70

## 2018-05-12 DIAGNOSIS — M6281 Muscle weakness (generalized): Secondary | ICD-10-CM

## 2018-05-12 DIAGNOSIS — R2681 Unsteadiness on feet: Secondary | ICD-10-CM | POA: Diagnosis not present

## 2018-05-12 DIAGNOSIS — R2689 Other abnormalities of gait and mobility: Secondary | ICD-10-CM | POA: Diagnosis not present

## 2018-05-12 NOTE — Therapy (Signed)
[place] MAIN [facility] SERVICES [address] [place], [place], [address] Phone: [phone]   Fax:  [phone]  Physical Therapy Treatment  Patient Details  Name: Jordan Mills MRN: [id number] Date of Birth: 1951/05/21 Referring Provider: Sela Hua, PA-C   Encounter Date: 05/12/2018  PT End of Session - 05/12/18 0959    Visit Number  17    Number of Visits  25    Date for PT Re-Evaluation  05/21/18    Authorization Type  7/10 start date 04/09/18    PT Start Time  0935    PT Stop Time  1015    PT Time Calculation (min)  40 min    Equipment Utilized During Treatment  Gait belt    Activity Tolerance  Patient tolerated treatment well;Treatment limited secondary to medical complications (Comment) elevated BP    Behavior During Therapy  [facility] for tasks assessed/performed       Past Medical History:  Diagnosis Date  . Gout   . HLD (hyperlipidemia)   . Hypertension   . Peripheral vascular disease [facility])     Past Surgical History:  Procedure Laterality Date  . AMPUTATION Right 08/28/2017   Procedure: AMPUTATION BELOW KNEE;  Surgeon: Algernon Huxley, MD;  Location: [facility] ORS;  Service: Vascular;  Laterality: Right;  . AMPUTATION TOE Right 07/19/2017   Procedure: AMPUTATION TOE/Rt 4th MPJ;  Surgeon: Sharlotte Alamo, DPM;  Location: [facility] ORS;  Service: Podiatry;  Laterality: Right;  . LOWER EXTREMITY ANGIOGRAPHY Right 07/01/2017   Procedure: Lower Extremity Angiography;  Surgeon: Algernon Huxley, MD;  Location: [place] CV LAB;  Service: Cardiovascular;  Laterality: Right;  . LOWER EXTREMITY ANGIOGRAPHY Right 08/15/2017   Procedure: Lower Extremity Angiography;  Surgeon: Algernon Huxley, MD;  Location: [place] CV LAB;  Service: Cardiovascular;  Laterality: Right;  . LOWER EXTREMITY ANGIOGRAPHY Right 08/16/2017   Procedure: LOWER EXTREMITY ANGIOGRAPHY;  Surgeon: Algernon Huxley, MD;  Location: [place] CV LAB;  Service: Cardiovascular;   Laterality: Right;  . LOWER EXTREMITY INTERVENTION  08/15/2017   Procedure: LOWER EXTREMITY INTERVENTION;  Surgeon: Algernon Huxley, MD;  Location: [place] CV LAB;  Service: Cardiovascular;;  . TONSILLECTOMY      Vitals:   05/12/18 0940  BP: (!) 144/62  Pulse: 70    Subjective Assessment - 05/12/18 0939    Subjective  Patient reports primarily staying inside and not walking too much since last session due to the heat. Has been compliant with HEP.     Pertinent History  Pt presented yesterday to this PT clinic for evaluation but was unable to be seen due to elevated BP. See note. Pt took his BP medication this morning and will take again as scheduled at 3pm, per pt. Pt is a 67 y/o M s/p R BKA 08/28/17 who presents to therapy for gait training with prosthesis. Pt is wearing his shrinker 24/7. Pt reports he received his prosthesis 2 weeks ago and was told not to use it until he started PT. Pt has not received PT since his BKA. Pt reports he has phantom pain at night but not during the day, pain as high as 9/10. Pt has been ambulating up to 100 yards with RW. Pt reports 1 fall shortly after R BKA but no falls since. Pt is independent with dressing, bathing (gets into tub to take a bath), driving. Goal: to be able to walk with prosthesis. Pt denies any issues getting up his  steps, hops up one step at a time.     Limitations  Walking;Standing    How long can you walk comfortably?  100 yards with RW    Patient Stated Goals  to ambulate with prosthesis and return to work    Currently in Pain?  No/denies       Ambulate without AD in hallway 800 ft with CGA ; decreased external rotation of device noted with improved step length. Cues for increasing BOS  ambulate across stable and unstable surface outside. Negotiating changing surfaces from grass to sidewalk, across brick with turns and obstacles in pathway without LOB.    Seated hamstring stretch 3x30 seconds bilateral LE   Tandem stance: one  leg on blue airex one leg on purple airex pad 3x30 seconds each leg in front ; 6 x total.    Airex pad: eyes closed 4x30 seconds CGA in // bars    Lunge onto bosu ball 10x each leg SUE support   Side lunge onto bosu ball 12x each leg BUE support     Football throw in // bars on airex pad no UE support 3 minutes                           PT Education - 05/12/18 0959    Education provided  Yes    Education Details  ambulate outside over changing surfaces, exercise technique     Person(s) Educated  Patient    Methods  Explanation;Demonstration;Verbal cues    Comprehension  Verbalized understanding;Returned demonstration       PT Short Term Goals - 04/09/18 1058      PT SHORT TERM GOAL #1   Title  Pt will independently complete HEP at least 4 days/wk for improved carryover between sessions    Baseline  HEP compliant    Time  2    Period  Weeks    Status  Achieved      PT SHORT TERM GOAL #2   Title  Patient (> 9 years old) will complete five times sit to stand test in < 15 seconds indicating an increased LE strength and improved balance.    Baseline  5/30: 18 seconds; 6/26:  12 seconds no UE support     Time  2    Period  Weeks    Status  Achieved        PT Long Term Goals - 04/09/18 1037      PT LONG TERM GOAL #1   Title  Pt's R knee F and E will improve to WNL for improved gait mechanics    Baseline  F: 98  E: -11 ; 6/26: F 112, E-6     Time  6    Period  Weeks    Status  Partially Met    Target Date  05/21/18      PT LONG TERM GOAL #2   Title  Pt will be able to stand for 1 hour to demonstrate improved endurance in standing with goal of eventual return to work    Baseline  Needs to 20ish minutes with AD     Time  6    Period  Weeks    Status  Partially Met    Target Date  05/21/18      PT LONG TERM GOAL #3   Title  Pt will demonstrate safe technique with sit<>stand transfers 5/5x for improved safety and independence    Baseline  6/26:  5x STS without UE support 12 seconds safely     Time  3    Period  Weeks    Status  Achieved      PT LONG TERM GOAL #4   Title  Patient will increase 10 meter walk test to >1.13ms as to improve gait speed for better community ambulation and to reduce fall risk    Baseline  5/30=.63 m/s with RW  6/26: 1.1 m/s with RW .42 m/s with QC     Time  6    Period  Weeks    Status  Partially Met    Target Date  05/21/18      PT LONG TERM GOAL #5   Title  Patient will reduce timed up and go to <11 seconds to reduce fall risk and demonstrate improved transfer/gait ability.    Baseline  5/30: 25.21 seconds 6/26: 13 seconds with RW, 20 seconds with QC     Time  6    Period  Weeks    Status  Partially Met    Target Date  05/21/18      Additional Long Term Goals   Additional Long Term Goals  Yes      PT LONG TERM GOAL #6   Title  Patient will increase six minute walk test distance to >1000 for progression to community ambulator and improve gait ability    Baseline  6/26: 820 ft with RW     Time  6    Period  Weeks    Status  New    Target Date  05/21/18            Plan - 05/12/18 1012    Clinical Impression Statement  Patient demonstrated good stability when negotiating obstacles outside without AD. Static balance on unstable surface is improving in both tandem and eyes closed positioning with patient demonstrating good response techniques. Pt will benefit from skilled PT interventions for improved gait mechanics and improved ROM and strength     Rehab Potential  Good    PT Frequency  2x / week    PT Duration  6 weeks    PT Treatment/Interventions  ADLs/Self Care Home Management;Aquatic Therapy;Biofeedback;Cryotherapy;Electrical Stimulation;Iontophoresis 441mml Dexamethasone;Moist Heat;Ultrasound;Contrast Bath;DME Instruction;Stair training;Gait training;Functional mobility training;Therapeutic activities;Therapeutic exercise;Balance training;Neuromuscular re-education;Patient/family  education;Prosthetic Training;Manual techniques;Wheelchair moInvestment banker, operationalompression bandaging;Passive range of motion;Dry needling;Energy conservation;Taping    PT Next Visit Plan  walk with Qc, walk with cane    PT Home Exercise Plan  R knee presses with overpressure from wife, densistization light squeezes to distal LLE, LAQ, ambulating in home, prone R hip flexor stretch with pillow x3 minutes    Consulted and Agree with Plan of Care  Patient;Family member/caregiver       Patient will benefit from skilled therapeutic intervention in order to improve the following deficits and impairments:  Abnormal gait, Decreased activity tolerance, Decreased balance, Decreased endurance, Decreased coordination, Decreased knowledge of use of DME, Decreased mobility, Decreased range of motion, Decreased safety awareness, Decreased strength, Difficulty walking, Hypomobility, Increased fascial restricitons, Increased muscle spasms, Impaired perceived functional ability, Impaired flexibility, Impaired sensation, Improper body mechanics, Postural dysfunction, Prosthetic Dependency, Pain  Visit Diagnosis: Unsteadiness on feet  Muscle weakness (generalized)  Other abnormalities of gait and mobility     Problem List Patient Active Problem List   Diagnosis Date Noted  . Peripheral vascular disease (HC[place]01/05/2018  . Hx of BKA, right ([place]12/17/2018  . Gangrene from atherosclerosis, extremities ([place]CP[place]11/08/2017  .  Ischemic leg 08/15/2017  . HLD (hyperlipidemia) 07/16/2017  . Hypertension 06/28/2017  . Tobacco use disorder 06/28/2017  . Atherosclerosis of native arteries of the extremities with ulceration ([place]) 06/28/2017  Janna Arch, PT, DPT   05/12/2018, 10:25 AM  [place] MAIN [facility] SERVICES [address] [place], [place], [address] Phone: [phone]   Fax:  [phone]  Name: Jordan Mills MRN: [id number] Date of Birth:  08/17/1951

## 2018-05-15 ENCOUNTER — Ambulatory Visit: Payer: Medicare Other

## 2018-05-16 ENCOUNTER — Other Ambulatory Visit: Payer: Self-pay

## 2018-05-19 ENCOUNTER — Ambulatory Visit: Payer: Medicare Other | Attending: Vascular Surgery

## 2018-05-19 DIAGNOSIS — M6281 Muscle weakness (generalized): Secondary | ICD-10-CM | POA: Insufficient documentation

## 2018-05-19 DIAGNOSIS — R2681 Unsteadiness on feet: Secondary | ICD-10-CM | POA: Insufficient documentation

## 2018-05-19 DIAGNOSIS — R2689 Other abnormalities of gait and mobility: Secondary | ICD-10-CM | POA: Insufficient documentation

## 2018-05-19 NOTE — Therapy (Signed)
[place] MAIN [facility] SERVICES [address] [place], [place], [address] Phone: [phone]   Fax:  [phone]  Physical Therapy Treatment  Patient Details  Name: Jordan Mills MRN: [id number] Date of Birth: April 07, 1951 Referring Provider: Sela Hua, PA-C   Encounter Date: 05/19/2018  PT End of Session - 05/19/18 0949    Visit Number  18    Number of Visits  25    Date for PT Re-Evaluation  05/21/18    Authorization Type  8/10 start date 04/09/18    PT Start Time  0931    PT Stop Time  1015    PT Time Calculation (min)  44 min    Equipment Utilized During Treatment  Gait belt    Activity Tolerance  Patient tolerated treatment well;Treatment limited secondary to medical complications (Comment) elevated BP    Behavior During Therapy  [facility] for tasks assessed/performed       Past Medical History:  Diagnosis Date  . Gout   . HLD (hyperlipidemia)   . Hypertension   . Peripheral vascular disease [facility])     Past Surgical History:  Procedure Laterality Date  . AMPUTATION Right 08/28/2017   Procedure: AMPUTATION BELOW KNEE;  Surgeon: Algernon Huxley, MD;  Location: [facility] ORS;  Service: Vascular;  Laterality: Right;  . AMPUTATION TOE Right 07/19/2017   Procedure: AMPUTATION TOE/Rt 4th MPJ;  Surgeon: Sharlotte Alamo, DPM;  Location: [facility] ORS;  Service: Podiatry;  Laterality: Right;  . LOWER EXTREMITY ANGIOGRAPHY Right 07/01/2017   Procedure: Lower Extremity Angiography;  Surgeon: Algernon Huxley, MD;  Location: [place] CV LAB;  Service: Cardiovascular;  Laterality: Right;  . LOWER EXTREMITY ANGIOGRAPHY Right 08/15/2017   Procedure: Lower Extremity Angiography;  Surgeon: Algernon Huxley, MD;  Location: [place] CV LAB;  Service: Cardiovascular;  Laterality: Right;  . LOWER EXTREMITY ANGIOGRAPHY Right 08/16/2017   Procedure: LOWER EXTREMITY ANGIOGRAPHY;  Surgeon: Algernon Huxley, MD;  Location: [place] CV LAB;  Service: Cardiovascular;   Laterality: Right;  . LOWER EXTREMITY INTERVENTION  08/15/2017   Procedure: LOWER EXTREMITY INTERVENTION;  Surgeon: Algernon Huxley, MD;  Location: [place] CV LAB;  Service: Cardiovascular;;  . TONSILLECTOMY      There were no vitals filed for this visit.  Subjective Assessment - 05/19/18 0935    Subjective  Patient and wife aware that tomorrow is the goal review session. Reports no difficulties at work. HEP compliant.     Pertinent History  Pt presented yesterday to this PT clinic for evaluation but was unable to be seen due to elevated BP. See note. Pt took his BP medication this morning and will take again as scheduled at 3pm, per pt. Pt is a 67 y/o M s/p R BKA 08/28/17 who presents to therapy for gait training with prosthesis. Pt is wearing his shrinker 24/7. Pt reports he received his prosthesis 2 weeks ago and was told not to use it until he started PT. Pt has not received PT since his BKA. Pt reports he has phantom pain at night but not during the day, pain as high as 9/10. Pt has been ambulating up to 100 yards with RW. Pt reports 1 fall shortly after R BKA but no falls since. Pt is independent with dressing, bathing (gets into tub to take a bath), driving. Goal: to be able to walk with prosthesis. Pt denies any issues getting up his steps, hops up one step at a time.  Limitations  Walking;Standing    How long can you walk comfortably?  100 yards with RW    Patient Stated Goals  to ambulate with prosthesis and return to work    Currently in Pain?  No/denies     Vitals: 135/71 pulse 58    Ambulate without AD in hallway 200 ft with CGA with horizontal head turns reading cards ; decreased external rotation of device noted with improved step length. Cues for increasing BOS  Side stepping outside // bars 6x 4 ft each direction, CGA.   Tandem stance: one leg on blue airex one leg on purple airex pad 3x30 seconds each leg in front ; 6 x total.   Airex pad: eyes closed 3x30 seconds  CGA in // bars   Lunge onto bosu ball 12x each leg SUE support when RLE back (prosthetic limb) no UE support when LLE back.   Side lunge onto bosu ball 12x each leg BUE support  Stop light game: Green to yellow cone: fast walk, yellow to orange cone : normal walk: orange to red cone slow walk x 4 trials. Stop at each cone: cones approximately 8 ft apart. CGA. Cues for keeping feet wider apart.   Football throw in // bars on airex pad no UE support 3 minutes                          PT Education - 05/19/18 0948    Education provided  Yes    Education Details  ambulation with horizontal head turns, exercise technique, next session potentially d/c     Person(s) Educated  Patient    Methods  Explanation;Demonstration;Verbal cues    Comprehension  Verbalized understanding;Returned demonstration       PT Short Term Goals - 04/09/18 1058      PT SHORT TERM GOAL #1   Title  Pt will independently complete HEP at least 4 days/wk for improved carryover between sessions    Baseline  HEP compliant    Time  2    Period  Weeks    Status  Achieved      PT SHORT TERM GOAL #2   Title  Patient (> 3 years old) will complete five times sit to stand test in < 15 seconds indicating an increased LE strength and improved balance.    Baseline  5/30: 18 seconds; 6/26:  12 seconds no UE support     Time  2    Period  Weeks    Status  Achieved        PT Long Term Goals - 04/09/18 1037      PT LONG TERM GOAL #1   Title  Pt's R knee F and E will improve to WNL for improved gait mechanics    Baseline  F: 98  E: -11 ; 6/26: F 112, E-6     Time  6    Period  Weeks    Status  Partially Met    Target Date  05/21/18      PT LONG TERM GOAL #2   Title  Pt will be able to stand for 1 hour to demonstrate improved endurance in standing with goal of eventual return to work    Baseline  Needs to 20ish minutes with AD     Time  6    Period  Weeks    Status  Partially Met     Target Date  05/21/18  PT LONG TERM GOAL #3   Title  Pt will demonstrate safe technique with sit<>stand transfers 5/5x for improved safety and independence    Baseline  6/26: 5x STS without UE support 12 seconds safely     Time  3    Period  Weeks    Status  Achieved      PT LONG TERM GOAL #4   Title  Patient will increase 10 meter walk test to >1.59ms as to improve gait speed for better community ambulation and to reduce fall risk    Baseline  5/30=.63 m/s with RW  6/26: 1.1 m/s with RW .42 m/s with QC     Time  6    Period  Weeks    Status  Partially Met    Target Date  05/21/18      PT LONG TERM GOAL #5   Title  Patient will reduce timed up and go to <11 seconds to reduce fall risk and demonstrate improved transfer/gait ability.    Baseline  5/30: 25.21 seconds 6/26: 13 seconds with RW, 20 seconds with QC     Time  6    Period  Weeks    Status  Partially Met    Target Date  05/21/18      Additional Long Term Goals   Additional Long Term Goals  Yes      PT LONG TERM GOAL #6   Title  Patient will increase six minute walk test distance to >1000 for progression to community ambulator and improve gait ability    Baseline  6/26: 820 ft with RW     Time  6    Period  Weeks    Status  New    Target Date  05/21/18            Plan - 05/19/18 0956    Clinical Impression Statement  Patient demonstrates improved stability on unstable surface in tandem stance with no trunk sway or LOB during tandem stance intervention. One LOB occurred with horizontal head turns and ambulation due to patient's distraction. Patient will be re-assessed next session for need to continue. Pt will benefit from skilled PT interventions for improved gait mechanics and improved ROM and strength     Rehab Potential  Good    PT Frequency  2x / week    PT Duration  6 weeks    PT Treatment/Interventions  ADLs/Self Care Home Management;Aquatic Therapy;Biofeedback;Cryotherapy;Electrical  Stimulation;Iontophoresis 424mml Dexamethasone;Moist Heat;Ultrasound;Contrast Bath;DME Instruction;Stair training;Gait training;Functional mobility training;Therapeutic activities;Therapeutic exercise;Balance training;Neuromuscular re-education;Patient/family education;Prosthetic Training;Manual techniques;Wheelchair moInvestment banker, operationalompression bandaging;Passive range of motion;Dry needling;Energy conservation;Taping    PT Next Visit Plan  d/c? or recert?     PT Home Exercise Plan  R knee presses with overpressure from wife, densistization light squeezes to distal LLE, LAQ, ambulating in home, prone R hip flexor stretch with pillow x3 minutes    Consulted and Agree with Plan of Care  Patient;Family member/caregiver       Patient will benefit from skilled therapeutic intervention in order to improve the following deficits and impairments:  Abnormal gait, Decreased activity tolerance, Decreased balance, Decreased endurance, Decreased coordination, Decreased knowledge of use of DME, Decreased mobility, Decreased range of motion, Decreased safety awareness, Decreased strength, Difficulty walking, Hypomobility, Increased fascial restricitons, Increased muscle spasms, Impaired perceived functional ability, Impaired flexibility, Impaired sensation, Improper body mechanics, Postural dysfunction, Prosthetic Dependency, Pain  Visit Diagnosis: Unsteadiness on feet  Muscle weakness (generalized)  Other abnormalities of gait and mobility  Problem List Patient Active Problem List   Diagnosis Date Noted  . Peripheral vascular disease ([place]) 10/22/2017  . Hx of BKA, right ([place]) 09/30/2017  . Gangrene from atherosclerosis, extremities ([place]) 08/25/2017  . Ischemic leg 08/15/2017  . HLD (hyperlipidemia) 07/16/2017  . Hypertension 06/28/2017  . Tobacco use disorder 06/28/2017  . Atherosclerosis of native arteries of the extremities with ulceration ([place]) 06/28/2017   Janna Arch, PT,  DPT   05/19/2018, 10:16 AM  [place] MAIN [facility] SERVICES [address] [place], [place], [address] Phone: [phone]   Fax:  [phone]  Name: Jordan Mills MRN: [id number] Date of Birth: Feb 24, 1951

## 2018-05-21 ENCOUNTER — Ambulatory Visit: Payer: Medicare Other

## 2018-05-26 ENCOUNTER — Ambulatory Visit: Payer: Medicare Other

## 2018-05-26 DIAGNOSIS — R2681 Unsteadiness on feet: Secondary | ICD-10-CM | POA: Diagnosis not present

## 2018-05-26 DIAGNOSIS — M6281 Muscle weakness (generalized): Secondary | ICD-10-CM

## 2018-05-26 DIAGNOSIS — R2689 Other abnormalities of gait and mobility: Secondary | ICD-10-CM | POA: Diagnosis not present

## 2018-05-26 NOTE — Therapy (Signed)
[place] MAIN [facility] SERVICES [address] [place], [place], [address] Phone: [phone]   Fax:  [phone]  Physical Therapy Treatment  Patient Details  Name: Jordan Mills MRN: [id number] Date of Birth: June 08, 1951 Referring Provider: Sela Hua, PA-C   Encounter Date: 05/26/2018  PT End of Session - 05/26/18 0940    Visit Number  19    Number of Visits  25    Date for PT Re-Evaluation  05/26/18    Authorization Type  9/10 start date 04/09/18    PT Start Time  0935    PT Stop Time  1001    PT Time Calculation (min)  26 min    Equipment Utilized During Treatment  Gait belt    Activity Tolerance  Patient tolerated treatment well;Treatment limited secondary to medical complications (Comment)   elevated BP   Behavior During Therapy  [facility] for tasks assessed/performed       Past Medical History:  Diagnosis Date  . Gout   . HLD (hyperlipidemia)   . Hypertension   . Peripheral vascular disease [facility])     Past Surgical History:  Procedure Laterality Date  . AMPUTATION Right 08/28/2017   Procedure: AMPUTATION BELOW KNEE;  Surgeon: Algernon Huxley, MD;  Location: [facility] ORS;  Service: Vascular;  Laterality: Right;  . AMPUTATION TOE Right 07/19/2017   Procedure: AMPUTATION TOE/Rt 4th MPJ;  Surgeon: Sharlotte Alamo, DPM;  Location: [facility] ORS;  Service: Podiatry;  Laterality: Right;  . LOWER EXTREMITY ANGIOGRAPHY Right 07/01/2017   Procedure: Lower Extremity Angiography;  Surgeon: Algernon Huxley, MD;  Location: [place] CV LAB;  Service: Cardiovascular;  Laterality: Right;  . LOWER EXTREMITY ANGIOGRAPHY Right 08/15/2017   Procedure: Lower Extremity Angiography;  Surgeon: Algernon Huxley, MD;  Location: [place] CV LAB;  Service: Cardiovascular;  Laterality: Right;  . LOWER EXTREMITY ANGIOGRAPHY Right 08/16/2017   Procedure: LOWER EXTREMITY ANGIOGRAPHY;  Surgeon: Algernon Huxley, MD;  Location: [place] CV LAB;  Service: Cardiovascular;   Laterality: Right;  . LOWER EXTREMITY INTERVENTION  08/15/2017   Procedure: LOWER EXTREMITY INTERVENTION;  Surgeon: Algernon Huxley, MD;  Location: [place] CV LAB;  Service: Cardiovascular;;  . TONSILLECTOMY      There were no vitals filed for this visit.  Subjective Assessment - 05/26/18 0939    Subjective  Patient reports that he has no difficulties at home. Does not feel like there is anything else to work on at home. Is compliant with HEP.     Pertinent History  Pt presented yesterday to this PT clinic for evaluation but was unable to be seen due to elevated BP. See note. Pt took his BP medication this morning and will take again as scheduled at 3pm, per pt. Pt is a 67 y/o M s/p R BKA 08/28/17 who presents to therapy for gait training with prosthesis. Pt is wearing his shrinker 24/7. Pt reports he received his prosthesis 2 weeks ago and was told not to use it until he started PT. Pt has not received PT since his BKA. Pt reports he has phantom pain at night but not during the day, pain as high as 9/10. Pt has been ambulating up to 100 yards with RW. Pt reports 1 fall shortly after R BKA but no falls since. Pt is independent with dressing, bathing (gets into tub to take a bath), driving. Goal: to be able to walk with prosthesis. Pt denies any issues getting up his steps,  hops up one step at a time.     Limitations  Walking;Standing    How long can you walk comfortably?  100 yards with RW    Patient Stated Goals  to ambulate with prosthesis and return to work    Currently in Pain?  No/denies       Vitals: 146/62 pulse 62   5x STS; 11 seconds 10 MWT: 9 seconds= 1.1 m/s without AD  TUG: 11 seconds no AD 6 min walk test : 1001 ft   Patient educated on need to continue ambulation, setting timer and ambulating duration of timer and to continue standing strengthening routine.                      PT Education - 05/26/18 0939    Education provided  Yes    Education  Details  POC, goal progression     Person(s) Educated  Patient    Methods  Explanation;Demonstration;Verbal cues    Comprehension  Verbalized understanding;Returned demonstration       PT Short Term Goals - 04/09/18 1058      PT SHORT TERM GOAL #1   Title  Pt will independently complete HEP at least 4 days/wk for improved carryover between sessions    Baseline  HEP compliant    Time  2    Period  Weeks    Status  Achieved      PT SHORT TERM GOAL #2   Title  Patient (> 38 years old) will complete five times sit to stand test in < 15 seconds indicating an increased LE strength and improved balance.    Baseline  5/30: 18 seconds; 6/26:  12 seconds no UE support     Time  2    Period  Weeks    Status  Achieved        PT Long Term Goals - 05/26/18 1062      PT LONG TERM GOAL #1   Title  Pt's R knee F and E will improve to WNL for improved gait mechanics    Baseline  F: 98  E: -11 ; 6/26: F 112, E-6 8/12: WFL    Time  6    Period  Weeks    Status  Achieved      PT LONG TERM GOAL #2   Title  Pt will be able to stand for 1 hour to demonstrate improved endurance in standing with goal of eventual return to work    Baseline  can walk about 30 minutes but patient reports this is the same as he was prior to amputation.     Time  6    Period  Weeks    Status  Partially Met      PT LONG TERM GOAL #3   Title  Pt will demonstrate safe technique with sit<>stand transfers 5/5x for improved safety and independence    Baseline  6/26: 5x STS without UE support 12 seconds safely ;    Time  3    Period  Weeks    Status  Achieved      PT LONG TERM GOAL #4   Title  Patient will increase 10 meter walk test to >1.71ms as to improve gait speed for better community ambulation and to reduce fall risk    Baseline  5/30=.63 m/s with RW  6/26: 1.1 m/s with RW .42 m/s with QC , 8/12: 1.11 m/s without AD    Time  6  Period  Weeks    Status  Achieved      PT LONG TERM GOAL #5   Title  Patient  will reduce timed up and go to <11 seconds to reduce fall risk and demonstrate improved transfer/gait ability.    Baseline  5/30: 25.21 seconds 6/26: 13 seconds with RW, 20 seconds with QC 8/12: 11 seconds no AD     Time  6    Period  Weeks    Status  Achieved      PT LONG TERM GOAL #6   Title  Patient will increase six minute walk test distance to >1000 for progression to community ambulator and improve gait ability    Baseline  6/26: 820 ft with RW 8/12: 1001 ft with no AD     Time  6    Period  Weeks    Status  Achieved            Plan - 05/26/18 1004    Clinical Impression Statement  Patient has met all except one goals at this time and per patient report is back at his baseline. Patient no longer requires AD for ambulation and ambulated 6 MWT in 1001 ft and 10 MWT with 1.64ms. TUG performed in 11 seconds without AD and 5x STS in 11 seconds. Patient and wife understanding/wanting today to be last session and need for continued exercise and ambulation at home for progression. This is a 1x cert due to being outside current cert from patient being sick.  I will be happy to see this patient again in the future as needed.     Rehab Potential  Good    PT Frequency  One time visit    PT Treatment/Interventions  ADLs/Self Care Home Management;Aquatic Therapy;Biofeedback;Cryotherapy;Electrical Stimulation;Iontophoresis 470mml Dexamethasone;Moist Heat;Ultrasound;Contrast Bath;DME Instruction;Stair training;Gait training;Functional mobility training;Therapeutic activities;Therapeutic exercise;Balance training;Neuromuscular re-education;Patient/family education;Prosthetic Training;Manual techniques;Wheelchair moInvestment banker, operationalompression bandaging;Passive range of motion;Dry needling;Energy conservation;Taping    PT Home Exercise Plan  R knee presses with overpressure from wife, densistization light squeezes to distal LLE, LAQ, ambulating in home, prone R hip flexor stretch with  pillow x3 minutes    Consulted and Agree with Plan of Care  Patient;Family member/caregiver       Patient will benefit from skilled therapeutic intervention in order to improve the following deficits and impairments:  Abnormal gait, Decreased activity tolerance, Decreased balance, Decreased endurance, Decreased coordination, Decreased knowledge of use of DME, Decreased mobility, Decreased range of motion, Decreased safety awareness, Decreased strength, Difficulty walking, Hypomobility, Increased fascial restricitons, Increased muscle spasms, Impaired perceived functional ability, Impaired flexibility, Impaired sensation, Improper body mechanics, Postural dysfunction, Prosthetic Dependency, Pain  Visit Diagnosis: Unsteadiness on feet  Muscle weakness (generalized)  Other abnormalities of gait and mobility     Problem List Patient Active Problem List   Diagnosis Date Noted  . Peripheral vascular disease ([facility]01/05/2018  . Hx of BKA, right ([facility]CL[facility]12/17/2018  . Gangrene from atherosclerosis, extremities ([place]CR[place]11/08/2017  . Ischemic leg 08/15/2017  . HLD (hyperlipidemia) 07/16/2017  . Hypertension 06/28/2017  . Tobacco use disorder 06/28/2017  . Atherosclerosis of native arteries of the extremities with ulceration ([facility]CEast [place]09/14/2018   MaJanna ArchPT, DPT   05/26/2018, 10:05 AM  [place] [facility] [address]one: [phone] Fax:  [phone]: Jordan SWETZRN: [id number] of Birth: 3/March 15, 1951

## 2018-06-11 ENCOUNTER — Ambulatory Visit: Payer: Medicare Other

## 2018-06-14 IMAGING — DX DG FOOT COMPLETE 3+V*R*
3 series · 3 of 3 positions shown · non-contrast
Comparison: None.

CLINICAL DATA: Pain

EXAM:
RIGHT FOOT COMPLETE - 3+ VIEW

[foot ap]
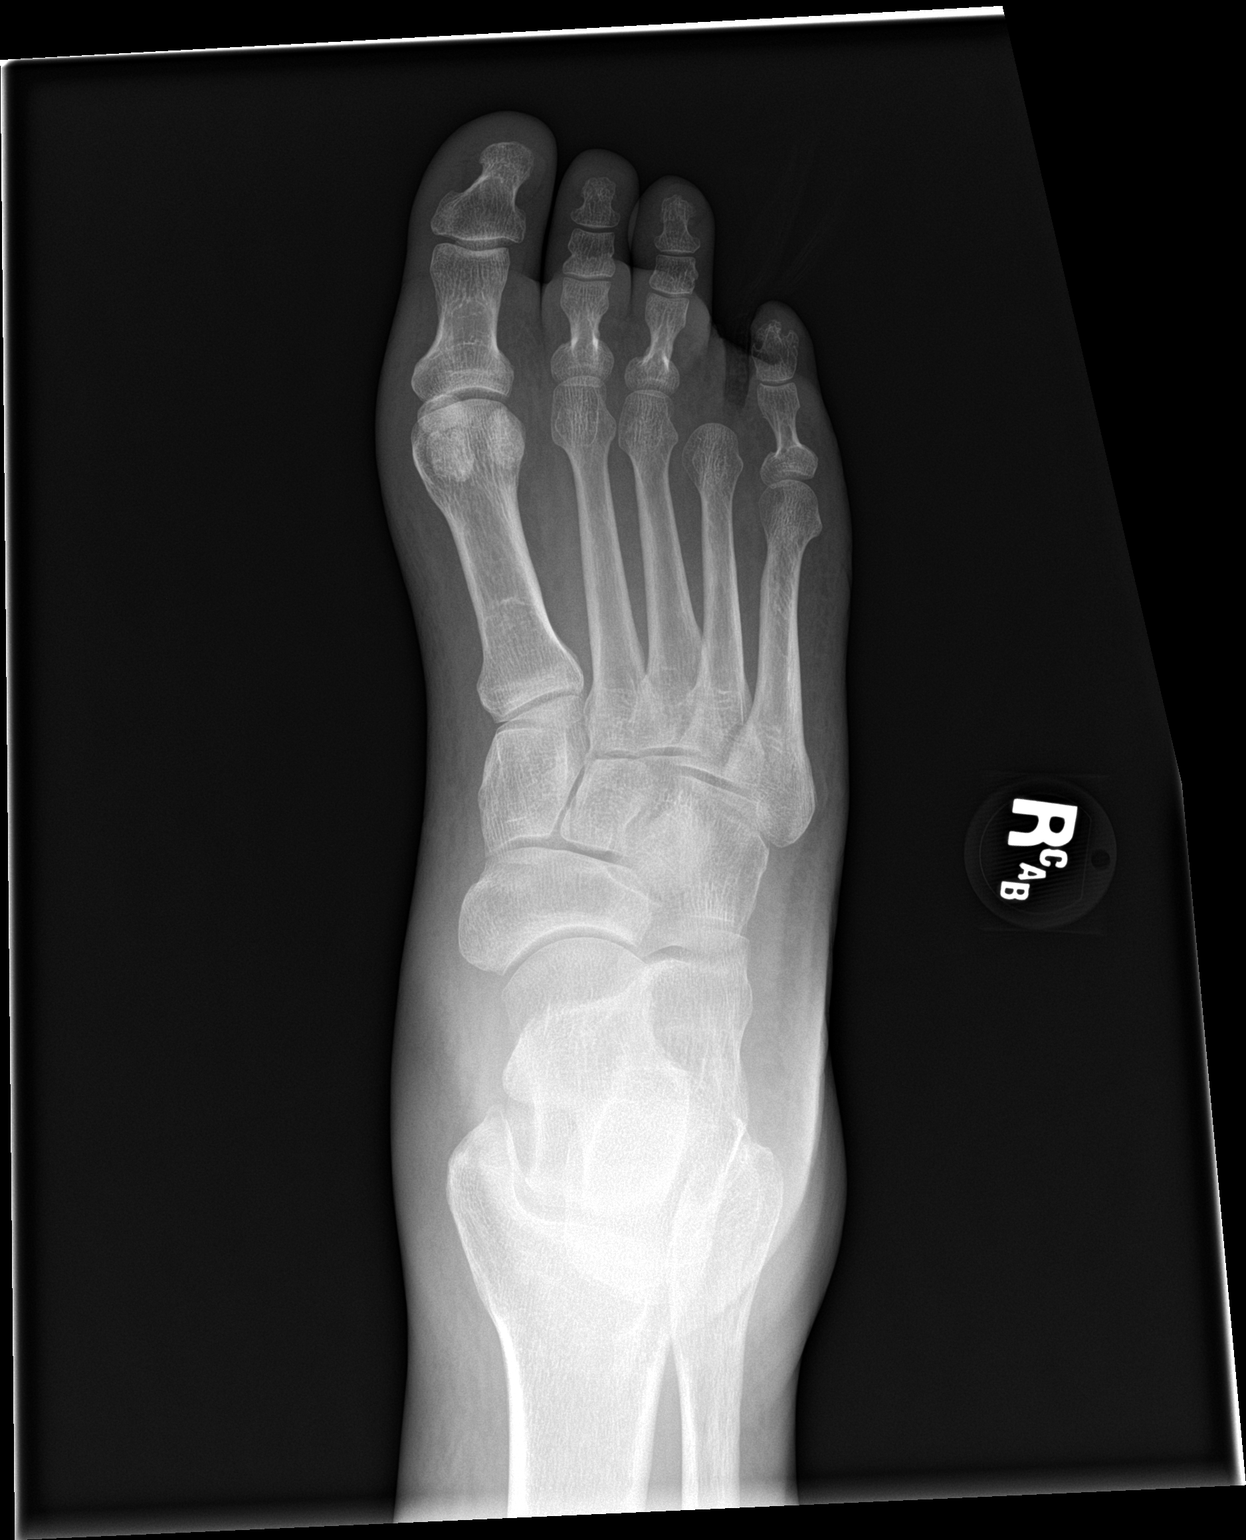

[foot obl]
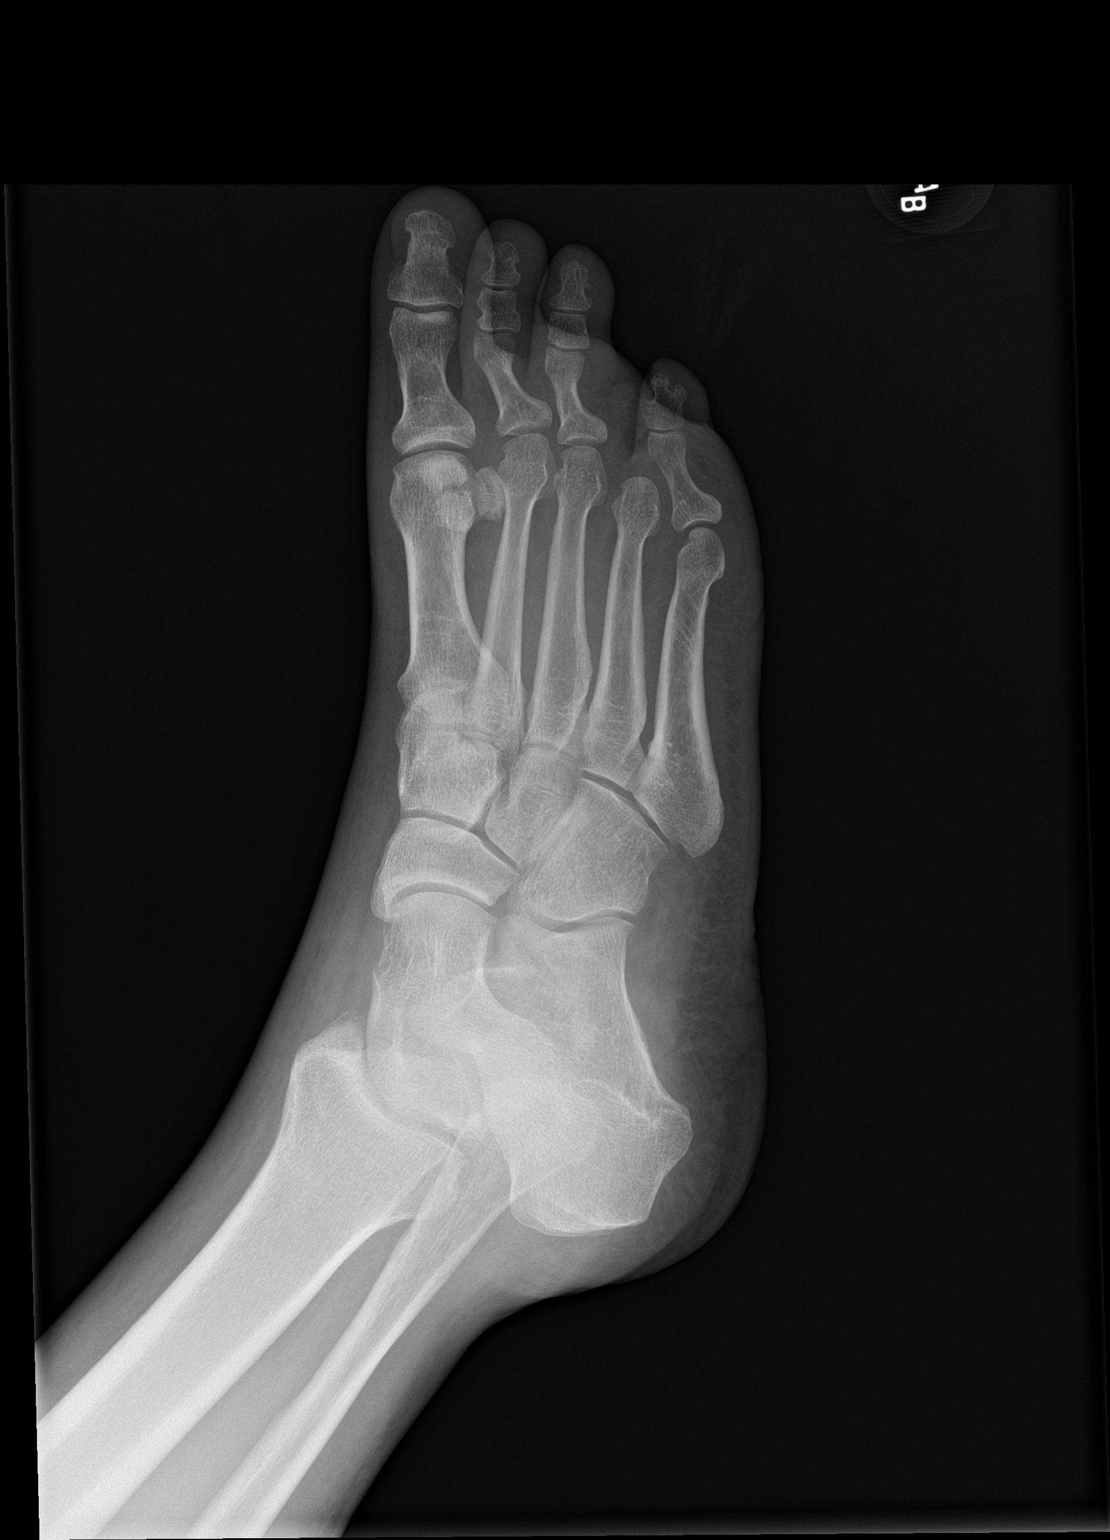

[foot lat]
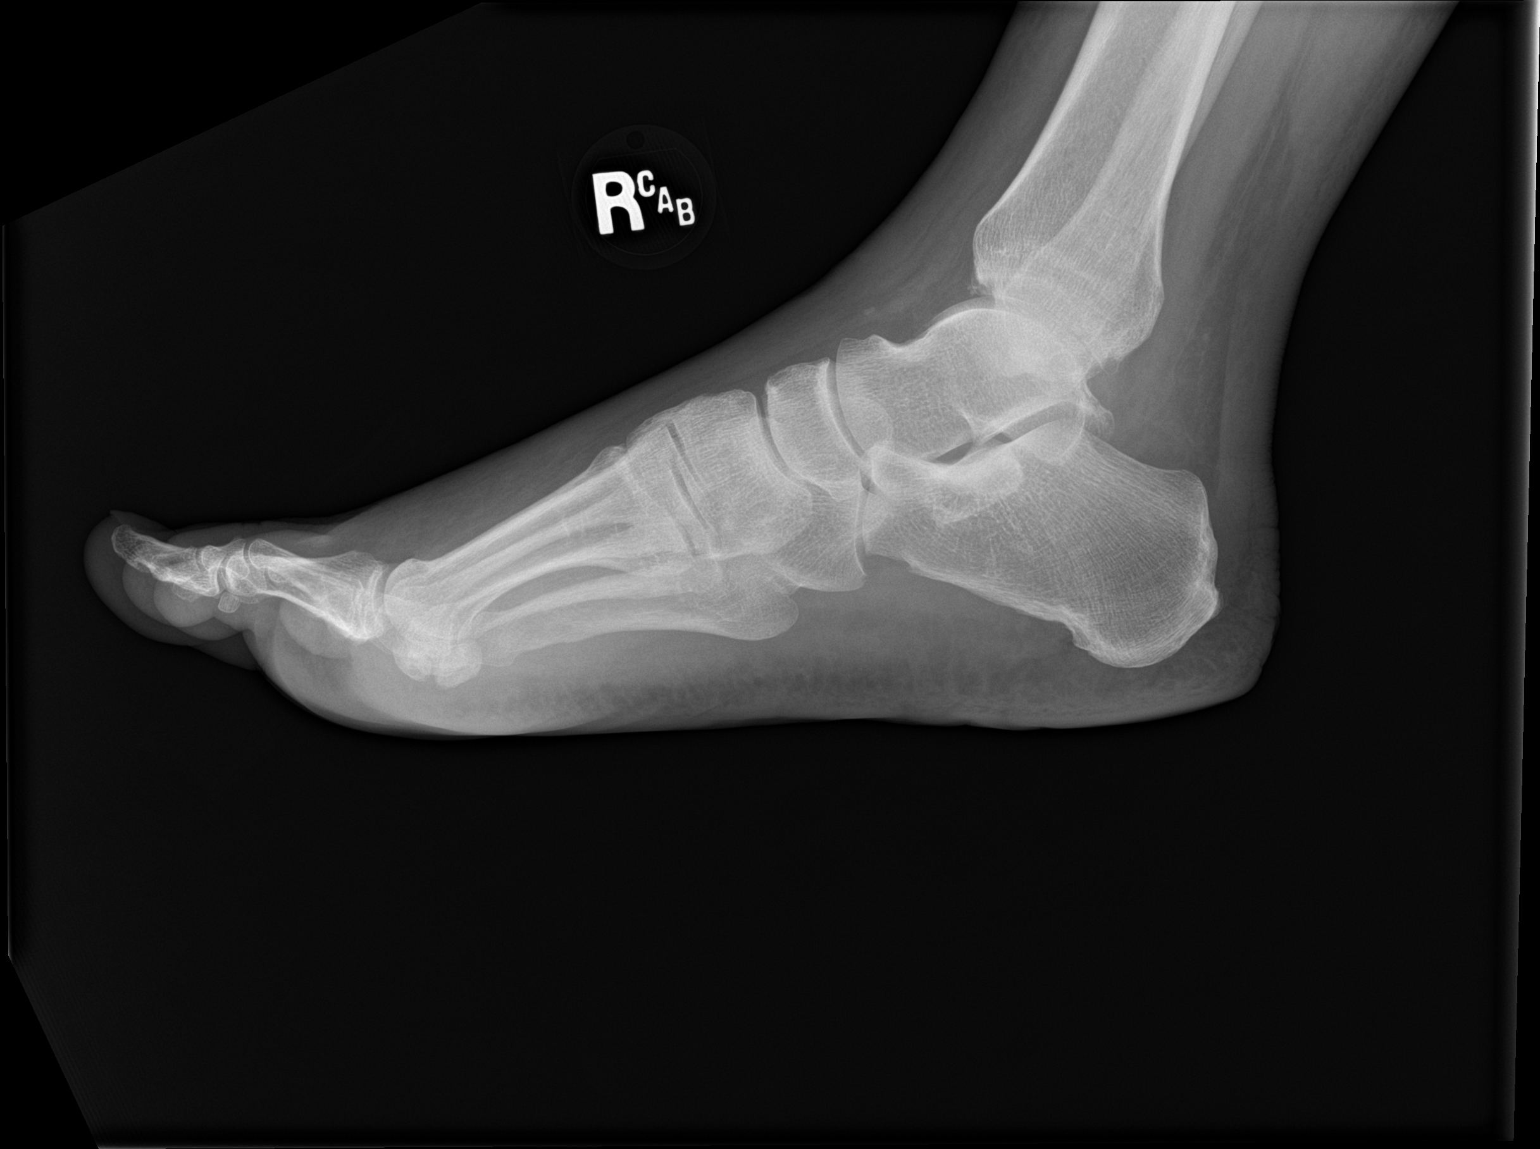

[3 of 3 positions shown; findings below may reference images not displayed]

FINDINGS: Frontal, oblique, and lateral views were obtained. The patient has
had amputation at the level of the fourth MTP joint with absence of
the fourth digit beyond the level of the metatarsal. There is no
acute fracture or dislocation. Joint spaces appear normal. No
erosive change or bony destruction. There are a few small scattered
foci of arterial vascular calcification.
IMPRESSION: Status post amputation of the fourth digit at the level of the
fourth MTP joint. No fracture or dislocation. Joint spaces appear
normal. No erosive change or bony destruction. Occasional scattered
foci of arterial vascular calcification.

## 2018-07-11 ENCOUNTER — Other Ambulatory Visit (INDEPENDENT_AMBULATORY_CARE_PROVIDER_SITE_OTHER): Payer: Self-pay | Admitting: Vascular Surgery

## 2018-07-17 ENCOUNTER — Encounter (INDEPENDENT_AMBULATORY_CARE_PROVIDER_SITE_OTHER): Payer: Self-pay | Admitting: Nurse Practitioner

## 2018-07-17 ENCOUNTER — Ambulatory Visit (INDEPENDENT_AMBULATORY_CARE_PROVIDER_SITE_OTHER): Payer: Medicare Other

## 2018-07-17 ENCOUNTER — Ambulatory Visit (INDEPENDENT_AMBULATORY_CARE_PROVIDER_SITE_OTHER): Payer: Medicare Other | Admitting: Nurse Practitioner

## 2018-07-17 VITALS — BP 181/90 | HR 83 | Resp 16 | Ht 66.0 in | Wt 157.0 lb

## 2018-07-17 DIAGNOSIS — E785 Hyperlipidemia, unspecified: Secondary | ICD-10-CM | POA: Diagnosis not present

## 2018-07-17 DIAGNOSIS — I739 Peripheral vascular disease, unspecified: Secondary | ICD-10-CM | POA: Diagnosis not present

## 2018-07-17 DIAGNOSIS — Z87891 Personal history of nicotine dependence: Secondary | ICD-10-CM | POA: Diagnosis not present

## 2018-07-17 DIAGNOSIS — Z89511 Acquired absence of right leg below knee: Secondary | ICD-10-CM

## 2018-07-17 DIAGNOSIS — I10 Essential (primary) hypertension: Secondary | ICD-10-CM

## 2018-07-17 NOTE — Progress Notes (Signed)
Subjective:    Patient ID: Jordan Mills, male    DOB: 12-Oct-1951, 67 y.o.   MRN: [id number] Chief Complaint  Patient presents with  . Follow-up    12month abi ultrasound    HPI  Jordan Mills is a 67 y.o. male that returns to the office for followup and review of the noninvasive studies. There have been no interval changes in lower extremity symptoms. No interval shortening of the patient's claudication distance or development of rest pain symptoms. No new ulcers or wounds have occurred since the last visit.  Patient currently has a right BKA.  He has a prosthesis which he is aware for about 6 months, he reports doing well with it.  There have been no significant changes to the patient's overall health care.  The patient denies amaurosis fugax or recent TIA symptoms. There are no recent neurological changes noted. The patient denies history of DVT, PE or superficial thrombophlebitis. The patient denies recent episodes of angina or shortness of breath.   ABI Rt=N/A and Lt=0.81  (previous ABI's Rt=left and Lt=0.86).  The waveforms are biphasic in the anterior tibial artery with monophasic waveforms in the posterior tibial artery.  Previous studies show the anterior and posterior tibial arteries as biphasic waveforms, however the waveform tracings appear relatively the same.  Review of Systems   Review of Systems: Negative Unless Checked Constitutional: [] Weight loss  [] Fever  [] Chills Cardiac: [] Chest pain   ? Atrial Fibrillation  [] Palpitations   [] Shortness of breath when laying flat   [] Shortness of breath with exertion. Vascular:  [] Pain in legs with walking   [] Pain in legs with standing  [] History of DVT   [] Phlebitis   [] Swelling in legs   [] Varicose veins   [] Non-healing ulcers Pulmonary:   [] Uses home oxygen   [] Productive cough   [] Hemoptysis   [] Wheeze  [] COPD   [] Asthma Neurologic:  [] Dizziness   [] Seizures   [] History of stroke   [] History of TIA  [] Aphasia    [] Vissual changes   [] Weakness or numbness in arm   [] Weakness or numbness in leg Musculoskeletal:   [] Joint swelling   [] Joint pain   [] Low back pain  ? History of Knee Replacement Hematologic:  [] Easy bruising  [] Easy bleeding   [] Hypercoagulable state   [] Anemic Gastrointestinal:  [] Diarrhea   [] Vomiting  [] Gastroesophageal reflux/heartburn   [] Difficulty swallowing. Genitourinary:  [] Chronic kidney disease   [] Difficult urination  [] Anuric   [] Blood in urine Skin:  [] Rashes   [] Ulcers  Psychological:  [] History of anxiety   []  History of major depression  ? Memory Difficulties     Objective:   Physical Exam  BP (!) 181/90 (BP Location: Right Arm)   Pulse 83   Resp 16   Ht 5\' 6"  (1.676 m)   Wt 157 lb (71.2 kg)   BMI 25.34 kg/m   Past Medical History:  Diagnosis Date  . Gout   . HLD (hyperlipidemia)   . Hypertension   . Peripheral vascular disease ([place])      Gen: WD/WN, NAD Head: [employer]/AT, No temporalis wasting.  Ear/Nose/Throat: Hearing grossly intact, nares w/o erythema or drainage Eyes: PER, EOMI, sclera nonicteric.  Neck: Supple, no masses.  No JVD.  Pulmonary:  Good air movement, no use of accessory muscles.  Cardiac: RRR Vascular:  Vessel Right Left  Radial Palpable Palpable  Dorsalis Pedis  non-palpable  none palpable  Posterior Tibial  non-palpable  non-palpable   Gastrointestinal: soft, non-distended. No guarding/no  peritoneal signs.  Musculoskeletal: M/S 5/5 throughout.  No deformity or atrophy.  Neurologic: Pain and light touch intact in extremities.  Symmetrical.  Speech is fluent. Motor exam as listed above. Psychiatric: Judgment intact, Mood & affect appropriate for pt's clinical situation. Dermatologic: No Venous rashes. No Ulcers Noted.  No changes consistent with cellulitis. Lymph : No Cervical lymphadenopathy, no lichenification or skin changes of chronic lymphedema.   Social History   Socioeconomic History  . Marital status: Married    Spouse  name: Not on file  . Number of children: Not on file  . Years of education: Not on file  . Highest education level: Not on file  Occupational History  . Not on file  Social Needs  . Financial resource strain: Not on file  . Food insecurity:    Worry: Not on file    Inability: Not on file  . Transportation needs:    Medical: Not on file    Non-medical: Not on file  Tobacco Use  . Smoking status: Former Smoker    Packs/day: 0.20    Types: Cigarettes    Last attempt to quit: 06/13/2017    Years since quitting: 1.0  . Smokeless tobacco: Never Used  Substance and Sexual Activity  . Alcohol use: No  . Drug use: No  . Sexual activity: Not on file  Lifestyle  . Physical activity:    Days per week: Not on file    Minutes per session: Not on file  . Stress: Not on file  Relationships  . Social connections:    Talks on phone: Not on file    Gets together: Not on file    Attends religious service: Not on file    Active member of club or organization: Not on file    Attends meetings of clubs or organizations: Not on file    Relationship status: Not on file  . Intimate partner violence:    Fear of current or ex partner: Not on file    Emotionally abused: Not on file    Physically abused: Not on file    Forced sexual activity: Not on file  Other Topics Concern  . Not on file  Social History Narrative  . Not on file    Past Surgical History:  Procedure Laterality Date  . AMPUTATION Right 08/28/2017   Procedure: AMPUTATION BELOW KNEE;  Surgeon: Algernon Huxley, MD;  Location: [facility];  Service: Vascular;  Laterality: Right;  . AMPUTATION TOE Right 07/19/2017   Procedure: AMPUTATION TOE/Rt 4th MPJ;  Surgeon: Sharlotte Alamo, DPM;  Location: [facility];  Service: Podiatry;  Laterality: Right;  . LOWER EXTREMITY ANGIOGRAPHY Right 07/01/2017   Procedure: Lower Extremity Angiography;  Surgeon: Algernon Huxley, MD;  Location: [place] [facility];  Service: Cardiovascular;  Laterality: Right;  .  LOWER EXTREMITY ANGIOGRAPHY Right 08/15/2017   Procedure: Lower Extremity Angiography;  Surgeon: Algernon Huxley, MD;  Location: [place] [facility];  Service: Cardiovascular;  Laterality: Right;  . LOWER EXTREMITY ANGIOGRAPHY Right 08/16/2017   Procedure: LOWER EXTREMITY ANGIOGRAPHY;  Surgeon: Algernon Huxley, MD;  Location: [place] [facility];  Service: Cardiovascular;  Laterality: Right;  . LOWER EXTREMITY INTERVENTION  08/15/2017   Procedure: LOWER EXTREMITY INTERVENTION;  Surgeon: Algernon Huxley, MD;  Location: [place] [facility];  Service: Cardiovascular;;  . TONSILLECTOMY      Family History  Problem Relation Age of Onset  . Hypertension Mother   . Hyperlipidemia Mother   .  Diabetes Mother   . Stroke Mother   . Lung cancer Sister     No Known Allergies     Assessment & Plan:   1. Peripheral vascular disease (H[facility]) ABI Rt=N/A and Lt=0.81  (previous ABI's Rt=left and Lt=0.86).  The waveforms are biphasic in the anterior tibial artery with monophasic waveforms in the posterior tibial artery.  Previous studies show the anterior and posterior tibial arteries as biphasic waveforms, however the waveform tracings appear relatively the same.   Recommend:  The patient has evidence of atherosclerosis of the lower extremities. The patient does not voice lifestyle limiting changes at this point in time.  Noninvasive studies do not suggest clinically significant change.  No invasive studies, angiography or surgery at this time The patient should continue walking and begin a more formal exercise program.  The patient should continue antiplatelet therapy and aggressive treatment of the lipid abnormalities  No changes in the patient's medications at this time  The patient should continue wearing graduated compression socks 10-15 mmHg strength to control the mild edema.   Patient should follow-up with ABIs in 6 months.  He will follow-up with a physical exam after.  Patient advised to contact  office sooner should ulceration, rest pain, or claudication occur.  - VAS [place] ABI WITH/WO TBI; Future  2. Essential hypertension Continue antihypertensive medications as already ordered, these medications have been reviewed and there are no changes at this time.   3. Hyperlipidemia, unspecified hyperlipidemia type Continue statin as ordered and reviewed, no changes at this time    Current Outpatient Medications on File Prior to Visit  Medication Sig Dispense Refill  . amLODipine (NORVASC) 5 MG tablet Take 5 mg by mouth 2 (two) times daily.  10  . aspirin EC 81 MG tablet Take by mouth.    Marland Kitchen atorvastatin (LIPITOR) 10 MG tablet TAKE 1 TABLET BY MOUTH DAILY 90 tablet 3  . clopidogrel (PLAVIX) 75 MG tablet Take 1 tablet (75 mg total) by mouth daily. 30 tablet 11  . docusate (COLACE) 50 MG/5ML liquid Take 5 mLs (50 mg total) daily by mouth. 100 mL 0  . ferrous sulfate 325 (65 FE) MG tablet TAKE 1 TABLET BY MOUTH TWICE A DAY WITH A MEAL 60 tablet 3  . gabapentin (NEURONTIN) 300 MG capsule Take 1 capsule (300 mg total) by mouth 2 (two) times daily. 60 capsule 1  . lisinopril-hydrochlorothiazide (PRINZIDE,ZESTORETIC) 20-12.5 MG tablet Take 2 tablets by mouth daily.  5  . metoprolol (LOPRESSOR) 50 MG tablet Take 50 mg by mouth 2 (two) times daily.  4  . multivitamin-iron-minerals-folic acid (CENTRUM) chewable tablet Chew 1 tablet by mouth daily.    Marland Kitchen oxyCODONE-acetaminophen (PERCOCET/ROXICET) 5-325 MG tablet Take 1-2 tablets by mouth every 6 (six) hours as needed for moderate pain or severe pain. (Patient not taking: Reported on 07/17/2018) 30 tablet 0  . silver sulfADIAZINE (SILVADENE) 1 % cream Apply 1 application topically daily. Apply to stump area (Patient not taking: Reported on 07/17/2018) 50 g 1   No current facility-administered medications on file prior to visit.     There are no Patient Instructions on file for this visit. Return in about 6 months (around 01/16/2019).   Kris Hartmann,  NP

## 2018-09-03 ENCOUNTER — Other Ambulatory Visit (INDEPENDENT_AMBULATORY_CARE_PROVIDER_SITE_OTHER): Payer: Self-pay | Admitting: Vascular Surgery

## 2018-09-16 ENCOUNTER — Encounter

## 2018-09-16 ENCOUNTER — Encounter (INDEPENDENT_AMBULATORY_CARE_PROVIDER_SITE_OTHER): Payer: Medicare Other

## 2018-09-16 ENCOUNTER — Ambulatory Visit (INDEPENDENT_AMBULATORY_CARE_PROVIDER_SITE_OTHER): Payer: Medicare Other | Admitting: Vascular Surgery

## 2019-01-16 ENCOUNTER — Encounter (INDEPENDENT_AMBULATORY_CARE_PROVIDER_SITE_OTHER): Payer: Self-pay

## 2019-01-16 ENCOUNTER — Encounter (INDEPENDENT_AMBULATORY_CARE_PROVIDER_SITE_OTHER): Payer: Medicare Other

## 2019-01-16 ENCOUNTER — Ambulatory Visit (INDEPENDENT_AMBULATORY_CARE_PROVIDER_SITE_OTHER): Payer: Medicare Other | Admitting: Vascular Surgery

## 2019-03-12 ENCOUNTER — Other Ambulatory Visit (INDEPENDENT_AMBULATORY_CARE_PROVIDER_SITE_OTHER): Payer: Self-pay | Admitting: Vascular Surgery

## 2019-03-12 DIAGNOSIS — I739 Peripheral vascular disease, unspecified: Secondary | ICD-10-CM

## 2019-03-13 ENCOUNTER — Ambulatory Visit (INDEPENDENT_AMBULATORY_CARE_PROVIDER_SITE_OTHER): Payer: Medicare Other | Admitting: Nurse Practitioner

## 2019-03-13 ENCOUNTER — Encounter (INDEPENDENT_AMBULATORY_CARE_PROVIDER_SITE_OTHER): Payer: Medicare Other

## 2019-03-26 ENCOUNTER — Ambulatory Visit (INDEPENDENT_AMBULATORY_CARE_PROVIDER_SITE_OTHER): Payer: Medicare Other | Admitting: Nurse Practitioner

## 2019-03-26 ENCOUNTER — Other Ambulatory Visit: Payer: Self-pay

## 2019-03-26 ENCOUNTER — Encounter (INDEPENDENT_AMBULATORY_CARE_PROVIDER_SITE_OTHER): Payer: Self-pay | Admitting: Nurse Practitioner

## 2019-03-26 ENCOUNTER — Ambulatory Visit (INDEPENDENT_AMBULATORY_CARE_PROVIDER_SITE_OTHER): Payer: Medicare Other

## 2019-03-26 VITALS — BP 184/93 | HR 71 | Resp 12 | Ht 67.0 in | Wt 163.0 lb

## 2019-03-26 DIAGNOSIS — Z89511 Acquired absence of right leg below knee: Secondary | ICD-10-CM | POA: Diagnosis not present

## 2019-03-26 DIAGNOSIS — I739 Peripheral vascular disease, unspecified: Secondary | ICD-10-CM

## 2019-03-26 DIAGNOSIS — I10 Essential (primary) hypertension: Secondary | ICD-10-CM

## 2019-03-26 DIAGNOSIS — Z79899 Other long term (current) drug therapy: Secondary | ICD-10-CM | POA: Diagnosis not present

## 2019-03-26 DIAGNOSIS — Z87891 Personal history of nicotine dependence: Secondary | ICD-10-CM

## 2019-03-26 DIAGNOSIS — E785 Hyperlipidemia, unspecified: Secondary | ICD-10-CM | POA: Diagnosis not present

## 2019-03-26 DIAGNOSIS — E1151 Type 2 diabetes mellitus with diabetic peripheral angiopathy without gangrene: Secondary | ICD-10-CM

## 2019-03-27 ENCOUNTER — Telehealth (INDEPENDENT_AMBULATORY_CARE_PROVIDER_SITE_OTHER): Payer: Self-pay

## 2019-03-27 NOTE — Telephone Encounter (Signed)
I attempted to contact the patient to schedule him for an angio with Dr. Lucky Cowboy. A message was left for a return call.

## 2019-03-27 NOTE — Progress Notes (Signed)
SUBJECTIVE:  Patient ID: Jordan Mills, male    DOB: 1951-03-24, 68 y.o.   MRN: [id number] Chief Complaint  Patient presents with  . Follow-up    HPI  Jordan Mills is a 68 y.o. male The patient returns to the office for followup and review of the noninvasive studies. There has been a  deterioration in the lower extremity symptoms.  The patient notes interval shortening of their claudication distance and development of mild rest pain symptoms. No new ulcers or wounds have occurred since the last visit.  There have been no significant changes to the patient's overall health care.  The patient denies amaurosis fugax or recent TIA symptoms. There are no recent neurological changes noted. The patient denies history of DVT, PE or superficial thrombophlebitis. The patient denies recent episodes of angina or shortness of breath.   She has a previous history of right below-knee amputation.  ABI's Rt=n/a and Lt=0.63 (previous ABI's Rt=n/a and Lt=0.81) Duplex US of the lower extremity arterial system shows monophasic waveforms throughout the left tibial arteries.  The posterior tibial artery is nearly flat.  Past Medical History:  Diagnosis Date  . Gout   . HLD (hyperlipidemia)   . Hypertension   . Peripheral vascular disease [facility])     Past Surgical History:  Procedure Laterality Date  . AMPUTATION Right 08/28/2017   Procedure: AMPUTATION BELOW KNEE;  Surgeon: Algernon Huxley, MD;  Location: [facility];  Service: Vascular;  Laterality: Right;  . AMPUTATION TOE Right 07/19/2017   Procedure: AMPUTATION TOE/Rt 4th MPJ;  Surgeon: Sharlotte Alamo, DPM;  Location: [facility];  Service: Podiatry;  Laterality: Right;  . LOWER EXTREMITY ANGIOGRAPHY Right 07/01/2017   Procedure: Lower Extremity Angiography;  Surgeon: Algernon Huxley, MD;  Location: [place] [facility];  Service: Cardiovascular;  Laterality: Right;  . LOWER EXTREMITY ANGIOGRAPHY Right 08/15/2017   Procedure: Lower Extremity Angiography;   Surgeon: Algernon Huxley, MD;  Location: [place] [facility];  Service: Cardiovascular;  Laterality: Right;  . LOWER EXTREMITY ANGIOGRAPHY Right 08/16/2017   Procedure: LOWER EXTREMITY ANGIOGRAPHY;  Surgeon: Algernon Huxley, MD;  Location: [place] [facility];  Service: Cardiovascular;  Laterality: Right;  . LOWER EXTREMITY INTERVENTION  08/15/2017   Procedure: LOWER EXTREMITY INTERVENTION;  Surgeon: Algernon Huxley, MD;  Location: [place] [facility];  Service: Cardiovascular;;  . TONSILLECTOMY      Social History   Socioeconomic History  . Marital status: Married    Spouse name: Not on file  . Number of children: Not on file  . Years of education: Not on file  . Highest education level: Not on file  Occupational History  . Not on file  Social Needs  . Financial resource strain: Not on file  . Food insecurity    Worry: Not on file    Inability: Not on file  . Transportation needs    Medical: Not on file    Non-medical: Not on file  Tobacco Use  . Smoking status: Former Smoker    Packs/day: 0.20    Types: Cigarettes    Quit date: 06/13/2017    Years since quitting: 1.7  . Smokeless tobacco: Never Used  Substance and Sexual Activity  . Alcohol use: No  . Drug use: No  . Sexual activity: Not on file  Lifestyle  . Physical activity    Days per week: Not on file    Minutes per session: Not on file  . Stress: Not on  file  Relationships  . Social Herbalist on phone: Not on file    Gets together: Not on file    Attends religious service: Not on file    Active member of club or organization: Not on file    Attends meetings of clubs or organizations: Not on file    Relationship status: Not on file  . Intimate partner violence    Fear of current or ex partner: Not on file    Emotionally abused: Not on file    Physically abused: Not on file    Forced sexual activity: Not on file  Other Topics Concern  . Not on file  Social History Narrative  . Not on file     Family History  Problem Relation Age of Onset  . Hypertension Mother   . Hyperlipidemia Mother   . Diabetes Mother   . Stroke Mother   . Lung cancer Sister     No Known Allergies   Review of Systems   Review of Systems: Negative Unless Checked Constitutional: [] Weight loss  [] Fever  [] Chills Cardiac: [] Chest pain   []  Atrial Fibrillation  [] Palpitations   [] Shortness of breath when laying flat   [] Shortness of breath with exertion. [] Shortness of breath at rest Vascular:  [] Pain in legs with walking   [] Pain in legs with standing [] Pain in legs when laying flat   [] Claudication    [] Pain in feet when laying flat    [] History of DVT   [] Phlebitis   [x] Swelling in legs   [] Varicose veins   [] Non-healing ulcers Pulmonary:   [] Uses home oxygen   [] Productive cough   [] Hemoptysis   [] Wheeze  [] COPD   [] Asthma Neurologic:  [] Dizziness   [] Seizures  [] Blackouts [] History of stroke   [] History of TIA  [] Aphasia   [] Temporary Blindness   [] Weakness or numbness in arm   [x] Weakness or numbness in leg Musculoskeletal:   [] Joint swelling   [] Joint pain   [] Low back pain  []  History of Knee Replacement [] Arthritis [] back Surgeries  []  Spinal Stenosis    Hematologic:  [] Easy bruising  [] Easy bleeding   [] Hypercoagulable state   [] Anemic Gastrointestinal:  [] Diarrhea   [] Vomiting  [] Gastroesophageal reflux/heartburn   [] Difficulty swallowing. [] Abdominal pain Genitourinary:  [] Chronic kidney disease   [] Difficult urination  [] Anuric   [] Blood in urine [] Frequent urination  [] Burning with urination   [] Hematuria Skin:  [] Rashes   [] Ulcers [] Wounds Psychological:  [] History of anxiety   []  History of major depression  []  Memory Difficulties      OBJECTIVE:   Physical Exam  BP (!) 184/93 (BP Location: Left Arm, Patient Position: Sitting, Cuff Size: Normal)   Pulse 71   Resp 12   Ht 5\' 7"  (1.702 m)   Wt 163 lb (73.9 kg)   BMI 25.53 kg/m   Gen: WD/WN, NAD Head: [place]/AT, No temporalis wasting.   Ear/Nose/Throat: Hearing grossly intact, nares w/o erythema or drainage Eyes: PER, EOMI, sclera nonicteric.  Neck: Supple, no masses.  No JVD.  Pulmonary:  Good air movement, no use of accessory muscles.  Cardiac: RRR Vascular:  Vessel Right Left  Radial Palpable Palpable  Dorsalis Pedis  Not Palpable  Posterior Tibial  Not Palpable   Gastrointestinal: soft, non-distended. No guarding/no peritoneal signs.  Musculoskeletal: M/S 5/5 throughout.    Right BKA Neurologic: Pain and light touch intact in extremities.  Symmetrical.  Speech is fluent. Motor exam as listed above. Psychiatric: Judgment intact, Mood &  affect appropriate for pt's clinical situation. Dermatologic: No Venous rashes. No Ulcers Noted.  No changes consistent with cellulitis. Lymph : No Cervical lymphadenopathy, no lichenification or skin changes of chronic lymphedema.       ASSESSMENT AND PLAN:  1. Peripheral vascular disease ([place]) Recommend:  The patient has experienced increased symptoms and is now describing lifestyle limiting claudication and mild rest pain.   Given the severity of the patient's lower extremity symptoms the patient should undergo angiography and intervention of the left lower extremity.  Risk and benefits were reviewed the patient.  Indications for the procedure were reviewed.  All questions were answered, the patient agrees to proceed.   The patient should continue walking and begin a more formal exercise program.  The patient should continue antiplatelet therapy and aggressive treatment of the lipid abnormalities  The patient will follow up with me after the angiogram.  2. Hyperlipidemia, unspecified hyperlipidemia type Continue statin as ordered and reviewed, no changes at this time   3. Essential hypertension Continue antihypertensive medications as already ordered, these medications have been reviewed and there are no changes at this time.    Current Outpatient Medications on File  Prior to Visit  Medication Sig Dispense Refill  . amLODipine (NORVASC) 5 MG tablet Take 5 mg by mouth 2 (two) times daily.  10  . aspirin EC 81 MG tablet Take by mouth.    Marland Kitchen atorvastatin (LIPITOR) 10 MG tablet TAKE 1 TABLET BY MOUTH DAILY 90 tablet 3  . clopidogrel (PLAVIX) 75 MG tablet TAKE 1 TABLET BY MOUTH DAILY 90 tablet 3  . lisinopril-hydrochlorothiazide (PRINZIDE,ZESTORETIC) 20-12.5 MG tablet Take 2 tablets by mouth daily.  5  . metoprolol (LOPRESSOR) 50 MG tablet Take 50 mg by mouth 2 (two) times daily.  4  . multivitamin-iron-minerals-folic acid (CENTRUM) chewable tablet Chew 1 tablet by mouth daily.    Marland Kitchen docusate (COLACE) 50 MG/5ML liquid Take 5 mLs (50 mg total) daily by mouth. (Patient not taking: Reported on 03/26/2019) 100 mL 0  . ferrous sulfate 325 (65 FE) MG tablet TAKE 1 TABLET BY MOUTH TWICE A DAY WITH A MEAL (Patient not taking: Reported on 03/26/2019) 60 tablet 3  . gabapentin (NEURONTIN) 300 MG capsule Take 1 capsule (300 mg total) by mouth 2 (two) times daily. (Patient not taking: Reported on 03/26/2019) 60 capsule 1  . oxyCODONE-acetaminophen (PERCOCET/ROXICET) 5-325 MG tablet Take 1-2 tablets by mouth every 6 (six) hours as needed for moderate pain or severe pain. (Patient not taking: Reported on 07/17/2018) 30 tablet 0  . silver sulfADIAZINE (SILVADENE) 1 % cream Apply 1 application topically daily. Apply to stump area (Patient not taking: Reported on 07/17/2018) 50 g 1   No current facility-administered medications on file prior to visit.     There are no Patient Instructions on file for this visit. No follow-ups on file.   Kris Hartmann, NP  This note was completed with Sales executive.  Any errors are purely unintentional.

## 2019-03-31 NOTE — Telephone Encounter (Signed)
I attempted to contact the patient again and a message was left for a return call. I have also attempted to contact the patient's spouse and her voicemail has not been set up, I was unable to leave a message for a callback.

## 2019-06-10 DIAGNOSIS — Z23 Encounter for immunization: Secondary | ICD-10-CM | POA: Diagnosis not present

## 2019-06-10 DIAGNOSIS — J449 Chronic obstructive pulmonary disease, unspecified: Secondary | ICD-10-CM | POA: Diagnosis not present

## 2019-06-10 DIAGNOSIS — I119 Hypertensive heart disease without heart failure: Secondary | ICD-10-CM | POA: Diagnosis not present

## 2019-06-10 DIAGNOSIS — N182 Chronic kidney disease, stage 2 (mild): Secondary | ICD-10-CM | POA: Diagnosis not present

## 2019-06-10 DIAGNOSIS — F17200 Nicotine dependence, unspecified, uncomplicated: Secondary | ICD-10-CM | POA: Diagnosis not present

## 2019-09-17 ENCOUNTER — Emergency Department
Admission: EM | Admit: 2019-09-17 | Discharge: 2019-09-17 | Disposition: A | Discharge status: Home / Self Care | Payer: Medicare Other | Attending: Emergency Medicine | Admitting: Emergency Medicine

## 2019-09-17 ENCOUNTER — Other Ambulatory Visit: Payer: Self-pay

## 2019-09-17 ENCOUNTER — Encounter: Payer: Self-pay | Admitting: Emergency Medicine

## 2019-09-17 DIAGNOSIS — I2510 Atherosclerotic heart disease of native coronary artery without angina pectoris: Secondary | ICD-10-CM | POA: Insufficient documentation

## 2019-09-17 DIAGNOSIS — Z79899 Other long term (current) drug therapy: Secondary | ICD-10-CM | POA: Diagnosis not present

## 2019-09-17 DIAGNOSIS — Z7902 Long term (current) use of antithrombotics/antiplatelets: Secondary | ICD-10-CM | POA: Diagnosis not present

## 2019-09-17 DIAGNOSIS — I10 Essential (primary) hypertension: Secondary | ICD-10-CM | POA: Diagnosis not present

## 2019-09-17 DIAGNOSIS — Z87891 Personal history of nicotine dependence: Secondary | ICD-10-CM | POA: Insufficient documentation

## 2019-09-17 DIAGNOSIS — J449 Chronic obstructive pulmonary disease, unspecified: Secondary | ICD-10-CM | POA: Diagnosis not present

## 2019-09-17 DIAGNOSIS — Z7982 Long term (current) use of aspirin: Secondary | ICD-10-CM | POA: Insufficient documentation

## 2019-09-17 DIAGNOSIS — I119 Hypertensive heart disease without heart failure: Secondary | ICD-10-CM | POA: Diagnosis not present

## 2019-09-17 DIAGNOSIS — N182 Chronic kidney disease, stage 2 (mild): Secondary | ICD-10-CM | POA: Diagnosis not present

## 2019-09-17 LAB — CBC
HCT: 45 % (ref 39.0–52.0)
Hemoglobin: 14.8 g/dL (ref 13.0–17.0)
MCH: 27.3 pg (ref 26.0–34.0)
MCHC: 32.9 g/dL (ref 30.0–36.0)
MCV: 83 fL (ref 80.0–100.0)
Platelets: 277 10*3/uL (ref 150–400)
RBC: 5.42 MIL/uL (ref 4.22–5.81)
RDW: 14.5 % (ref 11.5–15.5)
WBC: 11.7 10*3/uL — ABNORMAL HIGH (ref 4.0–10.5)
nRBC: 0 % (ref 0.0–0.2)

## 2019-09-17 LAB — BASIC METABOLIC PANEL
Anion gap: 10 (ref 5–15)
BUN: 16 mg/dL (ref 8–23)
CO2: 22 mmol/L (ref 22–32)
Calcium: 9 mg/dL (ref 8.9–10.3)
Chloride: 108 mmol/L (ref 98–111)
Creatinine, Ser: 1.55 mg/dL — ABNORMAL HIGH (ref 0.61–1.24)
GFR calc Af Amer: 52 mL/min — ABNORMAL LOW (ref >60)
GFR calc non Af Amer: 45 mL/min — ABNORMAL LOW (ref >60)
Glucose, Bld: 100 mg/dL — ABNORMAL HIGH (ref 70–99)
Potassium: 3.3 mmol/L — ABNORMAL LOW (ref 3.5–5.1)
Sodium: 140 mmol/L (ref 135–145)

## 2019-09-17 LAB — TROPONIN I (HIGH SENSITIVITY)
Troponin I (High Sensitivity): 15 ng/L (ref <18)
Troponin I (High Sensitivity): 16 ng/L (ref <18)

## 2019-09-17 MED ORDER — CLONIDINE HCL 0.1 MG PO TABS
0.2000 mg | ORAL_TABLET | Freq: Once | ORAL | Status: AC
  Administered 2019-09-17: 0.2 mg via ORAL
  Filled 2019-09-17: qty 2

## 2019-09-17 NOTE — ED Provider Notes (Signed)
[facility] Emergency Department Provider Note   ____________________________________________    I have reviewed the triage vital signs and the nursing notes.   HISTORY  Chief Complaint Hypertension     HPI Jordan Mills is a 68 y.o. male with a history of high blood pressure, PVD who presents with high blood pressure.  Patient reports that he was sent over from his PCP for elevated blood pressure.  He reports approximately 1-1/2 months ago was switched to metoprolol twice a day which he thinks is the reason for his elevated blood pressure.  He reports he feels fine.  No chest pain no shortness of breath no nausea no vomiting.  Did have a headache which is now resolved.  No neuro deficits.  Ports compliance with his medications   Past Medical History:  Diagnosis Date  . Gout   . HLD (hyperlipidemia)   . Hypertension   . Peripheral vascular disease [facility])     Patient Active Problem List   Diagnosis Date Noted  . Peripheral vascular disease ([place]) 10/22/2017  . Hx of BKA, right ([place]) 09/30/2017  . Gangrene from atherosclerosis, extremities ([place]) 08/25/2017  . Ischemic leg 08/15/2017  . HLD (hyperlipidemia) 07/16/2017  . Hypertension 06/28/2017  . Tobacco use disorder 06/28/2017  . Atherosclerosis of native arteries of the extremities with ulceration ([place]) 06/28/2017    Past Surgical History:  Procedure Laterality Date  . AMPUTATION Right 08/28/2017   Procedure: AMPUTATION BELOW KNEE;  Surgeon: Algernon Huxley, MD;  Location: [facility] ORS;  Service: Vascular;  Laterality: Right;  . AMPUTATION TOE Right 07/19/2017   Procedure: AMPUTATION TOE/Rt 4th MPJ;  Surgeon: Sharlotte Alamo, DPM;  Location: [facility] ORS;  Service: Podiatry;  Laterality: Right;  . LOWER EXTREMITY ANGIOGRAPHY Right 07/01/2017   Procedure: Lower Extremity Angiography;  Surgeon: Algernon Huxley, MD;  Location: [place] [facility];  Service: Cardiovascular;  Laterality: Right;  . LOWER  EXTREMITY ANGIOGRAPHY Right 08/15/2017   Procedure: Lower Extremity Angiography;  Surgeon: Algernon Huxley, MD;  Location: [place] [facility];  Service: Cardiovascular;  Laterality: Right;  . LOWER EXTREMITY ANGIOGRAPHY Right 08/16/2017   Procedure: LOWER EXTREMITY ANGIOGRAPHY;  Surgeon: Algernon Huxley, MD;  Location: [place] [facility];  Service: Cardiovascular;  Laterality: Right;  . LOWER EXTREMITY INTERVENTION  08/15/2017   Procedure: LOWER EXTREMITY INTERVENTION;  Surgeon: Algernon Huxley, MD;  Location: [place] [facility];  Service: Cardiovascular;;  . TONSILLECTOMY      Prior to Admission medications   Medication Sig Start Date End Date Taking? Authorizing Provider  amLODipine (NORVASC) 5 MG tablet Take 5 mg by mouth 2 (two) times daily.    [provider]  aspirin EC 81 MG tablet Take by mouth.    [provider]  atorvastatin (LIPITOR) 10 MG tablet TAKE 1 TABLET BY MOUTH DAILY 07/11/18   Algernon Huxley, MD  clopidogrel (PLAVIX) 75 MG tablet TAKE 1 TABLET BY MOUTH DAILY 09/03/18   Algernon Huxley, MD  lisinopril-hydrochlorothiazide (PRINZIDE,ZESTORETIC) 20-12.5 MG tablet Take 2 tablets by mouth daily.    [provider]  metoprolol (LOPRESSOR) 50 MG tablet Take 50 mg by mouth 2 (two) times daily.    [provider]  multivitamin-iron-minerals-folic acid (CENTRUM) chewable tablet Chew 1 tablet by mouth daily.    [provider]     Allergies Patient has no known allergies.  Family History  Problem Relation Age of Onset  . Hypertension Mother   .  Hyperlipidemia Mother   . Diabetes Mother   . Stroke Mother   . Lung cancer Sister     Social History Social History   Tobacco Use  . Smoking status: Former Smoker    Packs/day: 0.20    Types: Cigarettes    Quit date: 06/13/2017    Years since quitting: 2.2  . Smokeless tobacco: Never Used  Substance Use Topics  . Alcohol use: No  . Drug use: No    Review of Systems  Constitutional: No  fever/chills Eyes: No visual changes.  ENT: No sore throat. Cardiovascular: As above Respiratory: As above Gastrointestinal: No abdominal pain.  No nausea, no vomiting.   Genitourinary: Negative for dysuria. Musculoskeletal: Negative for back pain. Skin: Negative for rash. Neurological: As above   ____________________________________________   PHYSICAL EXAM:  VITAL SIGNS: ED Triage Vitals  Enc Vitals Group     BP 09/17/19 1537 (!) 220/138     Pulse Rate 09/17/19 1537 68     Resp 09/17/19 1537 16     Temp 09/17/19 1526 97.9 F (36.6 C)     Temp Source 09/17/19 1526 Oral     SpO2 09/17/19 1537 98 %     Weight --      Height --      Head Circumference --      Peak Flow --      Pain Score 09/17/19 1524 5     Pain Loc --      Pain Edu? --      Excl. in [place]? --     Constitutional: Alert and oriented.  Nose: No congestion/rhinnorhea. Mouth/Throat: Mucous membranes are moist.    Cardiovascular: Normal rate, regular rhythm. Grossly normal heart sounds.  Good peripheral circulation. Respiratory: Normal respiratory effort.  No retractions. Lungs CTAB. Gastrointestinal: Soft and nontender. No distention.    Musculoskeletal: No lower extremity tenderness nor edema.  Warm and well perfused Neurologic:  Normal speech and language. No gross focal neurologic deficits are appreciated.  Skin:  Skin is warm, dry and intact. No rash noted. Psychiatric: Mood and affect are normal. Speech and behavior are normal.  ____________________________________________   LABS (all labs ordered are listed, but only abnormal results are displayed)  Labs Reviewed  BASIC METABOLIC PANEL - Abnormal; Notable for the following components:      Result Value   Potassium 3.3 (*)    Glucose, Bld 100 (*)    Creatinine, Ser 1.55 (*)    GFR calc non Af Amer 45 (*)    GFR calc Af Amer 52 (*)    All other components within normal limits  CBC - Abnormal; Notable for the following components:   WBC 11.7  (*)    All other components within normal limits  TROPONIN I (HIGH SENSITIVITY)  TROPONIN I (HIGH SENSITIVITY)   ____________________________________________  EKG  ED ECG REPORT I, Lavonia Drafts, the attending physician, personally viewed and interpreted this ECG.  Date: 09/17/2019  Rhythm: normal sinus rhythm QRS Axis: normal Intervals: normal ST/T Wave abnormalities: normal Narrative Interpretation: no evidence of acute ischemia  ____________________________________________  RADIOLOGY  None ____________________________________________   PROCEDURES  Procedure(s) performed: No  Procedures   Critical Care performed: No ____________________________________________   INITIAL IMPRESSION / ASSESSMENT AND PLAN / ED COURSE  Pertinent labs & imaging results that were available during my care of the patient were reviewed by me and considered in my medical decision making (see chart for details).  Patient well-appearing and in no acute  distress, exam is overall reassuring besides elevated blood pressure.  He feels this is related to change in blood pressure medication certainly that seems possible, he notes it was better controlled on previous regimen.  Will give p.o. clonidine here and reevaluate.  Lab work is unremarkable, delta troponin unchanged  Blood pressure has significantly improved after treatment, appropriate for discharge with outpatient follow-up and blood pressure medication adjustment, he states he will go in the morning  ____________________________________________   FINAL CLINICAL IMPRESSION(S) / ED DIAGNOSES  Final diagnoses:  Essential hypertension        Note:  This document was prepared using Dragon voice recognition software and may include unintentional dictation errors.   Lavonia Drafts, MD 09/17/19 [phone]

## 2019-09-17 NOTE — Discharge Instructions (Addendum)
As we discussed please talk with your doctor in the morning about going back on your prior regimen, feel free to return to the emergency department if you have any symptoms or concerns

## 2019-09-17 NOTE — ED Notes (Signed)
Pt difficult stick, stuck x3 by this RN and NT. Lab called

## 2019-09-17 NOTE — ED Triage Notes (Signed)
First Nurse Note:  Referred to ED by PCP for HTN.  Patient is AAOx3.  Skin warm and dry. NAD.  Gait steady.  MAE equally and strong.

## 2019-09-17 NOTE — ED Triage Notes (Signed)
PT sent from PCP for hypertension. Hx of HTN, takes meds. C/o headache. NAD noted

## 2019-09-22 DIAGNOSIS — N182 Chronic kidney disease, stage 2 (mild): Secondary | ICD-10-CM | POA: Diagnosis not present

## 2019-09-22 DIAGNOSIS — I119 Hypertensive heart disease without heart failure: Secondary | ICD-10-CM | POA: Diagnosis not present

## 2019-09-22 DIAGNOSIS — Z789 Other specified health status: Secondary | ICD-10-CM | POA: Diagnosis not present

## 2019-09-24 DIAGNOSIS — N182 Chronic kidney disease, stage 2 (mild): Secondary | ICD-10-CM | POA: Diagnosis not present

## 2019-09-24 DIAGNOSIS — Z789 Other specified health status: Secondary | ICD-10-CM | POA: Diagnosis not present

## 2019-09-24 DIAGNOSIS — I119 Hypertensive heart disease without heart failure: Secondary | ICD-10-CM | POA: Diagnosis not present

## 2019-10-08 ENCOUNTER — Ambulatory Visit: Payer: Medicare Other | Attending: Internal Medicine

## 2019-10-08 DIAGNOSIS — Z20822 Contact with and (suspected) exposure to covid-19: Secondary | ICD-10-CM

## 2019-10-10 ENCOUNTER — Ambulatory Visit: Payer: Self-pay

## 2019-10-10 LAB — NOVEL CORONAVIRUS, NAA: SARS-CoV-2, NAA: DETECTED — AB

## 2019-10-10 NOTE — Telephone Encounter (Addendum)
Pt given Covid-19 positive results. Discussed mild, moderate and severe symptoms. Advised pt to call 911 for any respiratory issues and/dehydration. Discussed non test criteria for ending self isolation. Pt advised of way to manage symptoms at home and review isolation precautions especially the importance of washing hands frequently and wearing a mask when around others. Pt verbalized understanding. Will notify HD.  Reason for Disposition . Health Information question, no triage required and triager able to answer question  Answer Assessment - Initial Assessment Questions 1. REASON FOR CALL or QUESTION: "What is your reason for calling today?" or "How can I best help you?" or "What question do you have that I can help answer?"     Covid results  Protocols used: INFORMATION ONLY CALL-A-AH

## 2019-10-11 ENCOUNTER — Telehealth: Payer: Self-pay | Admitting: Infectious Diseases

## 2019-10-11 NOTE — Telephone Encounter (Signed)
Called to discuss with patient about Covid symptoms and the use of bamlanivimab, a monoclonal antibody infusion for those with mild to moderate Covid symptoms and at a high risk of hospitalization.  Pt is qualified for this infusion at the [facility] due to Age > 65   Message left to call back  

## 2020-03-01 ENCOUNTER — Other Ambulatory Visit: Payer: Self-pay | Admitting: *Deleted

## 2020-03-01 MED ORDER — LISINOPRIL-HYDROCHLOROTHIAZIDE 20-12.5 MG PO TABS: 2.0000 | ORAL_TABLET | Freq: Every day | ORAL | 3 refills | Status: DC

## 2020-07-04 ENCOUNTER — Other Ambulatory Visit: Payer: Self-pay | Admitting: Internal Medicine

## 2020-07-04 DIAGNOSIS — N182 Chronic kidney disease, stage 2 (mild): Secondary | ICD-10-CM

## 2020-07-18 ENCOUNTER — Encounter (INDEPENDENT_AMBULATORY_CARE_PROVIDER_SITE_OTHER): Payer: Self-pay | Admitting: Nurse Practitioner

## 2020-07-18 ENCOUNTER — Other Ambulatory Visit: Payer: Self-pay

## 2020-07-18 ENCOUNTER — Ambulatory Visit (INDEPENDENT_AMBULATORY_CARE_PROVIDER_SITE_OTHER): Payer: Medicare Other | Admitting: Nurse Practitioner

## 2020-07-18 VITALS — BP 168/76 | HR 62 | Ht 66.0 in | Wt 156.0 lb

## 2020-07-18 DIAGNOSIS — E785 Hyperlipidemia, unspecified: Secondary | ICD-10-CM | POA: Diagnosis not present

## 2020-07-18 DIAGNOSIS — I10 Essential (primary) hypertension: Secondary | ICD-10-CM | POA: Diagnosis not present

## 2020-07-18 DIAGNOSIS — I7025 Atherosclerosis of native arteries of other extremities with ulceration: Secondary | ICD-10-CM

## 2020-07-18 DIAGNOSIS — Z89511 Acquired absence of right leg below knee: Secondary | ICD-10-CM | POA: Diagnosis not present

## 2020-07-18 NOTE — Progress Notes (Signed)
Subjective:    Patient ID: Jordan Mills, male    DOB: [birth year], 69 y.o.   MRN: [id number] Chief Complaint  Patient presents with  . Follow-up    needs office visit in order to receive prostetic     Patient presents today for follow-up regarding his right lower extremity below-knee amputation. Patient currently has a prosthesis that he has been doing well with walking up until recently. The patient notes that his socket has not been fitting properly. He has needed to utilize an additional 3-4 socks in order to help the prosthetic fit. And when he does this his prosthetic falls off. When he does not wear the socks it does not fit properly and after ambulation. He also notes that his left lower extremity has been having some worsening claudication-like symptoms. He describes it as more of a weakness. It has been well over a year since he is followed up in our office. Previous notes state that the patient was supposed to have an angiogram done. He denies any new wounds or ulcerations. He denies any discoloration.   Review of Systems  Cardiovascular:       Claudication  Neurological: Positive for weakness.  All other systems reviewed and are negative.      Objective:   Physical Exam Vitals reviewed.  HENT:     Head: Normocephalic.  Cardiovascular:     Rate and Rhythm: Normal rate.     Pulses: Decreased pulses.     Heart sounds: Normal heart sounds.  Pulmonary:     Effort: Pulmonary effort is normal.  Musculoskeletal:     Right Lower Extremity: Right leg is amputated below knee.  Skin:    General: Skin is warm and dry.  Neurological:     Mental Status: He is alert and oriented to person, place, and time.  Psychiatric:        Mood and Affect: Mood normal.        Behavior: Behavior normal.        Thought Content: Thought content normal.        Judgment: Judgment normal.     BP (!) 168/76   Pulse 62   Ht 5\' 6"  (1.676 m)   Wt 156 lb (70.8 kg)   BMI 25.18 kg/m   Past  Medical History:  Diagnosis Date  . Gout   . HLD (hyperlipidemia)   . Hypertension   . Peripheral vascular disease ([place])     Social History   Socioeconomic History  . Marital status: Married    Spouse name: Not on file  . Number of children: Not on file  . Years of education: Not on file  . Highest education level: Not on file  Occupational History  . Not on file  Tobacco Use  . Smoking status: Former Smoker    Packs/day: 0.20    Types: Cigarettes    Quit date: 06/13/2017    Years since quitting: 3.0  . Smokeless tobacco: Never Used  Vaping Use  . Vaping Use: Former  Substance and Sexual Activity  . Alcohol use: No  . Drug use: No  . Sexual activity: Not on file  Other Topics Concern  . Not on file  Social History Narrative  . Not on file   Social Determinants of Health   Financial Resource Strain:   . Difficulty of Paying Living Expenses: Not on file  Food Insecurity:   . Worried About Charity fundraiser in the Last Year: Not  on file  . Ran Out of Food in the Last Year: Not on file  Transportation Needs:   . Lack of Transportation (Medical): Not on file  . Lack of Transportation (Non-Medical): Not on file  Physical Activity:   . Days of Exercise per Week: Not on file  . Minutes of Exercise per Session: Not on file  Stress:   . Feeling of Stress : Not on file  Social Connections:   . Frequency of Communication with Friends and Family: Not on file  . Frequency of Social Gatherings with Friends and Family: Not on file  . Attends Religious Services: Not on file  . Active Member of Clubs or Organizations: Not on file  . Attends Archivist Meetings: Not on file  . Marital Status: Not on file  Intimate Partner Violence:   . Fear of Current or Ex-Partner: Not on file  . Emotionally Abused: Not on file  . Physically Abused: Not on file  . Sexually Abused: Not on file    Past Surgical History:  Procedure Laterality Date  . AMPUTATION Right  08/28/2017   Procedure: AMPUTATION BELOW KNEE;  Surgeon: Algernon Huxley, MD;  Location: [facility] ORS;  Service: Vascular;  Laterality: Right;  . AMPUTATION TOE Right 07/19/2017   Procedure: AMPUTATION TOE/Rt 4th MPJ;  Surgeon: Sharlotte Alamo, DPM;  Location: [facility] ORS;  Service: Podiatry;  Laterality: Right;  . LOWER EXTREMITY ANGIOGRAPHY Right 07/01/2017   Procedure: Lower Extremity Angiography;  Surgeon: Algernon Huxley, MD;  Location: [place] [facility];  Service: Cardiovascular;  Laterality: Right;  . LOWER EXTREMITY ANGIOGRAPHY Right 08/15/2017   Procedure: Lower Extremity Angiography;  Surgeon: Algernon Huxley, MD;  Location: [place] [facility];  Service: Cardiovascular;  Laterality: Right;  . LOWER EXTREMITY ANGIOGRAPHY Right 08/16/2017   Procedure: LOWER EXTREMITY ANGIOGRAPHY;  Surgeon: Algernon Huxley, MD;  Location: [place] [facility];  Service: Cardiovascular;  Laterality: Right;  . LOWER EXTREMITY INTERVENTION  08/15/2017   Procedure: LOWER EXTREMITY INTERVENTION;  Surgeon: Algernon Huxley, MD;  Location: [place] [facility];  Service: Cardiovascular;;  . TONSILLECTOMY      Family History  Problem Relation Age of Onset  . Hypertension Mother   . Hyperlipidemia Mother   . Diabetes Mother   . Stroke Mother   . Lung cancer Sister     No Known Allergies     Assessment & Plan:   1. Hx of BKA, right ([place]) The patient should have replacement for his right lower extremity prosthesis due to it being ill fitting. Dislike of fitting restraints his ability to walk and do other activities. Currently the prosthesis falls off with certain movements which is dangerous for the patient. We will send a referral over for new prosthesis and supplies.  2. Primary hypertension Continue antihypertensive medications as already ordered, these medications have been reviewed and there are no changes at this time.   3. Atherosclerosis of native arteries of the extremities with ulceration [facility]) The patient is  currently complaining of worsening claudication-like symptoms. He describes it as a sudden weakness the patient has not followed up in over a year we will have the patient return at his earliest convenience for ABIs to check his blood flow. - VAS [place] ABI WITH/WO TBI; Future  4. Hyperlipidemia, unspecified hyperlipidemia type Continue statin as ordered and reviewed, no changes at this time    Current Outpatient Medications on File Prior to Visit  Medication Sig Dispense Refill  .  amLODipine (NORVASC) 5 MG tablet Take 5 mg by mouth 2 (two) times daily.  10  . aspirin EC 81 MG tablet Take by mouth.    Marland Kitchen atorvastatin (LIPITOR) 10 MG tablet TAKE 1 TABLET BY MOUTH DAILY 90 tablet 3  . clopidogrel (PLAVIX) 75 MG tablet TAKE 1 TABLET BY MOUTH DAILY (Patient not taking: Reported on 07/18/2020) 90 tablet 3  . lisinopril-hydrochlorothiazide (ZESTORETIC) 20-12.5 MG tablet Take 2 tablets by mouth daily. 180 tablet 3  . metoprolol tartrate (LOPRESSOR) 50 MG tablet TAKE 1 TABLET BY MOUTH TWICE A DAY 180 tablet 1  . multivitamin-iron-minerals-folic acid (CENTRUM) chewable tablet Chew 1 tablet by mouth daily. (Patient not taking: Reported on 07/18/2020)     No current facility-administered medications on file prior to visit.    There are no Patient Instructions on file for this visit. No follow-ups on file.   Kris Hartmann, NP

## 2020-07-20 ENCOUNTER — Encounter (INDEPENDENT_AMBULATORY_CARE_PROVIDER_SITE_OTHER): Payer: Medicare Other

## 2020-07-20 ENCOUNTER — Ambulatory Visit (INDEPENDENT_AMBULATORY_CARE_PROVIDER_SITE_OTHER): Payer: Medicare Other | Admitting: Nurse Practitioner

## 2020-07-26 ENCOUNTER — Ambulatory Visit (INDEPENDENT_AMBULATORY_CARE_PROVIDER_SITE_OTHER): Payer: Medicare Other | Admitting: Nurse Practitioner

## 2020-07-26 ENCOUNTER — Other Ambulatory Visit: Payer: Self-pay

## 2020-07-26 ENCOUNTER — Ambulatory Visit (INDEPENDENT_AMBULATORY_CARE_PROVIDER_SITE_OTHER): Payer: Medicare Other

## 2020-07-26 ENCOUNTER — Encounter (INDEPENDENT_AMBULATORY_CARE_PROVIDER_SITE_OTHER): Payer: Self-pay | Admitting: Nurse Practitioner

## 2020-07-26 VITALS — BP 146/75 | HR 69 | Ht 66.0 in | Wt 154.0 lb

## 2020-07-26 DIAGNOSIS — I739 Peripheral vascular disease, unspecified: Secondary | ICD-10-CM | POA: Diagnosis not present

## 2020-07-26 DIAGNOSIS — E785 Hyperlipidemia, unspecified: Secondary | ICD-10-CM

## 2020-07-26 DIAGNOSIS — F17200 Nicotine dependence, unspecified, uncomplicated: Secondary | ICD-10-CM

## 2020-07-26 DIAGNOSIS — I10 Essential (primary) hypertension: Secondary | ICD-10-CM | POA: Diagnosis not present

## 2020-07-26 DIAGNOSIS — I7025 Atherosclerosis of native arteries of other extremities with ulceration: Secondary | ICD-10-CM | POA: Diagnosis not present

## 2020-07-27 ENCOUNTER — Telehealth (INDEPENDENT_AMBULATORY_CARE_PROVIDER_SITE_OTHER): Payer: Self-pay

## 2020-07-27 NOTE — Telephone Encounter (Signed)
I called the patient and spoke with him about getting scheduled for a LLE angio with Dr. Lucky Cowboy. Patient was offered 08/01/20, patient stated he talked with his wife and will call back if he decides to do the procedure.

## 2020-07-31 ENCOUNTER — Encounter (INDEPENDENT_AMBULATORY_CARE_PROVIDER_SITE_OTHER): Payer: Self-pay | Admitting: Nurse Practitioner

## 2020-07-31 NOTE — Progress Notes (Signed)
Subjective:    Patient ID: Jordan Mills, male    DOB: 02-28-1951, 69 y.o.   MRN: [id number] Chief Complaint  Patient presents with  . Follow-up    pt conv. abi    The patient returns to the office for followup and review of the noninvasive studies. There has been a significant deterioration in the lower extremity symptoms.  The patient notes interval shortening of their claudication distance and development of mild rest pain symptoms. No new ulcers or wounds have occurred since the last visit.  There have been no significant changes to the patient's overall health care.  The patient denies amaurosis fugax or recent TIA symptoms. There are no recent neurological changes noted. The patient denies history of DVT, PE or superficial thrombophlebitis. The patient denies recent episodes of angina or shortness of breath.   ABI's Rt=n/a and Lt=0.63 (previous ABI's Rt=n/a and Lt=0.63) Duplex US of the lower extremity arterial system shows monophasic waveforms in the peroneal artery with an occluded arterial and posterior tibial arteries left digit waveforms are nearly flat.   Review of Systems  Cardiovascular: Positive for leg swelling.  Musculoskeletal: Positive for gait problem.  Neurological: Positive for weakness.  All other systems reviewed and are negative.      Objective:   Physical Exam Vitals reviewed.  HENT:     Head: Normocephalic.  Cardiovascular:     Rate and Rhythm: Normal rate.     Pulses: Normal pulses.  Pulmonary:     Effort: Pulmonary effort is normal.  Neurological:     Mental Status: He is alert and oriented to person, place, and time.  Psychiatric:        Mood and Affect: Mood normal.        Behavior: Behavior normal.        Thought Content: Thought content normal.        Judgment: Judgment normal.     BP (!) 146/75   Pulse 69   Ht 5\' 6"  (1.676 m)   Wt 154 lb (69.9 kg)   BMI 24.86 kg/m   Past Medical History:  Diagnosis Date  . Gout   . HLD  (hyperlipidemia)   . Hypertension   . Peripheral vascular disease ([place])     Social History   Socioeconomic History  . Marital status: Married    Spouse name: Not on file  . Number of children: Not on file  . Years of education: Not on file  . Highest education level: Not on file  Occupational History  . Not on file  Tobacco Use  . Smoking status: Former Smoker    Packs/day: 0.20    Types: Cigarettes    Quit date: 06/13/2017    Years since quitting: 3.1  . Smokeless tobacco: Never Used  Vaping Use  . Vaping Use: Former  Substance and Sexual Activity  . Alcohol use: No  . Drug use: No  . Sexual activity: Not on file  Other Topics Concern  . Not on file  Social History Narrative  . Not on file   Social Determinants of Health   Financial Resource Strain:   . Difficulty of Paying Living Expenses: Not on file  Food Insecurity:   . Worried About Charity fundraiser in the Last Year: Not on file  . Ran Out of Food in the Last Year: Not on file  Transportation Needs:   . Lack of Transportation (Medical): Not on file  . Lack of Transportation (Non-Medical): Not  on file  Physical Activity:   . Days of Exercise per Week: Not on file  . Minutes of Exercise per Session: Not on file  Stress:   . Feeling of Stress : Not on file  Social Connections:   . Frequency of Communication with Friends and Family: Not on file  . Frequency of Social Gatherings with Friends and Family: Not on file  . Attends Religious Services: Not on file  . Active Member of Clubs or Organizations: Not on file  . Attends Archivist Meetings: Not on file  . Marital Status: Not on file  Intimate Partner Violence:   . Fear of Current or Ex-Partner: Not on file  . Emotionally Abused: Not on file  . Physically Abused: Not on file  . Sexually Abused: Not on file    Past Surgical History:  Procedure Laterality Date  . AMPUTATION Right 08/28/2017   Procedure: AMPUTATION BELOW KNEE;  Surgeon:  Algernon Huxley, MD;  Location: [facility] ORS;  Service: Vascular;  Laterality: Right;  . AMPUTATION TOE Right 07/19/2017   Procedure: AMPUTATION TOE/Rt 4th MPJ;  Surgeon: Sharlotte Alamo, DPM;  Location: [facility] ORS;  Service: Podiatry;  Laterality: Right;  . LOWER EXTREMITY ANGIOGRAPHY Right 07/01/2017   Procedure: Lower Extremity Angiography;  Surgeon: Algernon Huxley, MD;  Location: [place] [facility];  Service: Cardiovascular;  Laterality: Right;  . LOWER EXTREMITY ANGIOGRAPHY Right 08/15/2017   Procedure: Lower Extremity Angiography;  Surgeon: Algernon Huxley, MD;  Location: [place] [facility];  Service: Cardiovascular;  Laterality: Right;  . LOWER EXTREMITY ANGIOGRAPHY Right 08/16/2017   Procedure: LOWER EXTREMITY ANGIOGRAPHY;  Surgeon: Algernon Huxley, MD;  Location: [place] [facility];  Service: Cardiovascular;  Laterality: Right;  . LOWER EXTREMITY INTERVENTION  08/15/2017   Procedure: LOWER EXTREMITY INTERVENTION;  Surgeon: Algernon Huxley, MD;  Location: [place] [facility];  Service: Cardiovascular;;  . TONSILLECTOMY      Family History  Problem Relation Age of Onset  . Hypertension Mother   . Hyperlipidemia Mother   . Diabetes Mother   . Stroke Mother   . Lung cancer Sister     No Known Allergies     Assessment & Plan:   1. Peripheral vascular disease ([place]) Recommend:  The patient has experienced increased symptoms and is now describing lifestyle limiting claudication and mild rest pain.   Given the severity of the patient's lower extremity symptoms the patient should undergo angiography and intervention.  Risk and benefits were reviewed the patient.  Indications for the procedure were reviewed.  All questions were answered, the patient agrees to proceed.   The patient should continue walking and begin a more formal exercise program.  The patient should continue antiplatelet therapy and aggressive treatment of the lipid abnormalities  The patient will follow up with me after the  angiogram.   2. Hyperlipidemia, unspecified hyperlipidemia type Continue statin as ordered and reviewed, no changes at this time   3. Primary hypertension Continue antihypertensive medications as already ordered, these medications have been reviewed and there are no changes at this time.   4. Tobacco use disorder Smoking cessation was discussed, 3-10 minutes spent on this topic specifically    Current Outpatient Medications on File Prior to Visit  Medication Sig Dispense Refill  . amLODipine (NORVASC) 5 MG tablet Take 5 mg by mouth 2 (two) times daily.  10  . aspirin EC 81 MG tablet Take by mouth.    Marland Kitchen atorvastatin (LIPITOR) 10  MG tablet TAKE 1 TABLET BY MOUTH DAILY 90 tablet 3  . clopidogrel (PLAVIX) 75 MG tablet TAKE 1 TABLET BY MOUTH DAILY 90 tablet 3  . lisinopril-hydrochlorothiazide (ZESTORETIC) 20-12.5 MG tablet Take 2 tablets by mouth daily. 180 tablet 3  . metoprolol tartrate (LOPRESSOR) 50 MG tablet TAKE 1 TABLET BY MOUTH TWICE A DAY 180 tablet 1  . multivitamin-iron-minerals-folic acid (CENTRUM) chewable tablet Chew 1 tablet by mouth daily.      No current facility-administered medications on file prior to visit.    There are no Patient Instructions on file for this visit. No follow-ups on file.   Kris Hartmann, NP

## 2020-09-07 DIAGNOSIS — Z23 Encounter for immunization: Secondary | ICD-10-CM | POA: Diagnosis not present

## 2020-12-07 DIAGNOSIS — Z23 Encounter for immunization: Secondary | ICD-10-CM | POA: Diagnosis not present

## 2021-02-10 ENCOUNTER — Other Ambulatory Visit: Payer: Self-pay | Admitting: Internal Medicine

## 2021-03-03 ENCOUNTER — Other Ambulatory Visit: Payer: Self-pay | Admitting: Internal Medicine

## 2021-03-03 DIAGNOSIS — N182 Chronic kidney disease, stage 2 (mild): Secondary | ICD-10-CM

## 2021-05-17 DIAGNOSIS — Z20822 Contact with and (suspected) exposure to covid-19: Secondary | ICD-10-CM | POA: Diagnosis not present

## 2021-11-04 ENCOUNTER — Other Ambulatory Visit: Payer: Self-pay | Admitting: Internal Medicine

## 2021-11-21 ENCOUNTER — Ambulatory Visit (INDEPENDENT_AMBULATORY_CARE_PROVIDER_SITE_OTHER): Payer: Medicare Other | Admitting: Nurse Practitioner

## 2021-11-21 ENCOUNTER — Encounter: Payer: Self-pay | Admitting: Internal Medicine

## 2021-11-21 ENCOUNTER — Other Ambulatory Visit: Payer: Self-pay

## 2021-11-21 VITALS — BP 140/78 | HR 54 | Ht 66.0 in | Wt 154.5 lb

## 2021-11-21 DIAGNOSIS — Z89511 Acquired absence of right leg below knee: Secondary | ICD-10-CM | POA: Diagnosis not present

## 2021-11-21 DIAGNOSIS — E785 Hyperlipidemia, unspecified: Secondary | ICD-10-CM | POA: Diagnosis not present

## 2021-11-21 DIAGNOSIS — Z23 Encounter for immunization: Secondary | ICD-10-CM | POA: Diagnosis not present

## 2021-11-21 DIAGNOSIS — F17200 Nicotine dependence, unspecified, uncomplicated: Secondary | ICD-10-CM | POA: Diagnosis not present

## 2021-11-21 DIAGNOSIS — Z131 Encounter for screening for diabetes mellitus: Secondary | ICD-10-CM | POA: Diagnosis not present

## 2021-11-21 DIAGNOSIS — I10 Essential (primary) hypertension: Secondary | ICD-10-CM

## 2021-11-21 DIAGNOSIS — Z1159 Encounter for screening for other viral diseases: Secondary | ICD-10-CM | POA: Diagnosis not present

## 2021-11-21 NOTE — Assessment & Plan Note (Addendum)
BP 140/78 today. Advise pt to follow a heart healthy and low salt diet. Increase fluid intake to 6-8 glasses per day.  Advise pt to be physically active. Continue the current medication amlodipine, lisinopril-HTCZ, metoprolol. Lab ordered

## 2021-11-21 NOTE — Assessment & Plan Note (Signed)
-  Advise pt to follow a low fat and carbohydrates diet. -Increase physical activity. -Lab ordered

## 2021-11-21 NOTE — Progress Notes (Signed)
Established Patient Office Visit  Subjective:  Patient ID: Jordan Mills, male    DOB: [birth year]  Age: 71 y.o. MRN: [id number]   CC:  Chief Complaint  Patient presents with   Hypertension    Patient present today for recheck of BP.     HPI [name] presents for follow up on BP  Past Medical History:  Diagnosis Date   Gout    HLD (hyperlipidemia)    Hypertension    Peripheral vascular disease ([place])     Past Surgical History:  Procedure Laterality Date   AMPUTATION Right 08/28/2017   Procedure: AMPUTATION BELOW KNEE;  Surgeon: Algernon Huxley, MD;  Location: [facility];  Service: Vascular;  Laterality: Right;   AMPUTATION TOE Right 07/19/2017   Procedure: AMPUTATION TOE/Rt 4th MPJ;  Surgeon: Sharlotte Alamo, DPM;  Location: [facility];  Service: Podiatry;  Laterality: Right;   LOWER EXTREMITY ANGIOGRAPHY Right 07/01/2017   Procedure: Lower Extremity Angiography;  Surgeon: Algernon Huxley, MD;  Location: [place] [facility];  Service: Cardiovascular;  Laterality: Right;   LOWER EXTREMITY ANGIOGRAPHY Right 08/15/2017   Procedure: Lower Extremity Angiography;  Surgeon: Algernon Huxley, MD;  Location: [place] [facility];  Service: Cardiovascular;  Laterality: Right;   LOWER EXTREMITY ANGIOGRAPHY Right 08/16/2017   Procedure: LOWER EXTREMITY ANGIOGRAPHY;  Surgeon: Algernon Huxley, MD;  Location: [place] [facility];  Service: Cardiovascular;  Laterality: Right;   LOWER EXTREMITY INTERVENTION  08/15/2017   Procedure: LOWER EXTREMITY INTERVENTION;  Surgeon: Algernon Huxley, MD;  Location: [place] [facility];  Service: Cardiovascular;;   TONSILLECTOMY      Family History  Problem Relation Age of Onset   Hypertension Mother    Hyperlipidemia Mother    Diabetes Mother    Stroke Mother    Lung cancer Sister     Social History   Socioeconomic History   Marital status: Married    Spouse name: Not on file   Number of children: Not on file   Years of education: Not on file   Highest  education level: Not on file  Occupational History   Not on file  Tobacco Use   Smoking status: Former    Packs/day: 0.20    Types: Cigarettes    Quit date: 06/13/2017    Years since quitting: 4.4   Smokeless tobacco: Never  Vaping Use   Vaping Use: Former  Substance and Sexual Activity   Alcohol use: No   Drug use: No   Sexual activity: Not on file  Other Topics Concern   Not on file  Social History Narrative   Not on file   Social Determinants of Health   Financial Resource Strain: Not on file  Food Insecurity: Not on file  Transportation Needs: Not on file  Physical Activity: Not on file  Stress: Not on file  Social Connections: Not on file  Intimate Partner Violence: Not on file    Outpatient Medications Prior to Visit  Medication Sig Dispense Refill   amLODipine (NORVASC) 5 MG tablet TAKE 1 TABLET BY MOUTH TWICE A DAY 180 tablet 2   aspirin EC 81 MG tablet Take by mouth.     atorvastatin (LIPITOR) 10 MG tablet TAKE 1 TABLET BY MOUTH DAILY 90 tablet 3   clopidogrel (PLAVIX) 75 MG tablet TAKE 1 TABLET BY MOUTH DAILY 90 tablet 3   lisinopril-hydrochlorothiazide (ZESTORETIC) 20-12.5 MG tablet TAKE 2 TABLETS BY MOUTH EVERY DAY 180 tablet 3  metoprolol tartrate (LOPRESSOR) 50 MG tablet TAKE 1 TABLET BY MOUTH TWICE A DAY 180 tablet 1   multivitamin-iron-minerals-folic acid (CENTRUM) chewable tablet Chew 1 tablet by mouth daily.      No facility-administered medications prior to visit.    No Known Allergies  ROS Review of Systems  Constitutional:  Negative for activity change, appetite change and fatigue.  HENT:  Negative for congestion, ear discharge and ear pain.   Eyes:  Negative for photophobia and discharge.  Respiratory:  Negative for cough, choking and shortness of breath.   Cardiovascular:  Negative for chest pain.  Gastrointestinal:  Negative for abdominal pain and anal bleeding.  Genitourinary:  Negative for difficulty urinating.  Skin:  Negative for  color change and pallor.  Neurological:  Negative for dizziness and facial asymmetry.  Psychiatric/Behavioral:  Negative for behavioral problems.      Objective:    Physical Exam Constitutional:      Appearance: Normal appearance.  HENT:     Head: Normocephalic.  Cardiovascular:     Rate and Rhythm: Normal rate and regular rhythm.  Pulmonary:     Effort: Pulmonary effort is normal.     Breath sounds: Normal breath sounds.  Abdominal:     General: Abdomen is flat. Bowel sounds are normal.  Musculoskeletal:        General: No tenderness.     Cervical back: Neck supple.     Comments: BKA of right side  Skin:    General: Skin is warm and dry.  Neurological:     Mental Status: He is alert and oriented to person, place, and time.    BP 140/78    Pulse (!) 54    Ht 5\' 6"  (1.676 m)    Wt 154 lb 8 oz (70.1 kg)    BMI 24.94 kg/m  Wt Readings from Last 3 Encounters:  11/21/21 154 lb 8 oz (70.1 kg)  07/26/20 154 lb (69.9 kg)  07/18/20 156 lb (70.8 kg)     Health Maintenance Due  Topic Date Due   Hepatitis C Screening  Never done   Pneumonia Vaccine 74+ Years old (2 - PCV) 08/18/2018    There are no preventive care reminders to display for this patient.  No results found for: TSH Lab Results  Component Value Date   WBC 11.0 (H) 11/21/2021   HGB 13.1 (L) 11/21/2021   HCT 39.9 11/21/2021   MCV 87.3 11/21/2021   PLT 227 11/21/2021   Lab Results  Component Value Date   NA 136 11/21/2021   K 4.6 11/21/2021   CO2 18 (L) 11/21/2021   GLUCOSE 87 11/21/2021   BUN 23 11/21/2021   CREATININE 1.75 (H) 11/21/2021   BILITOT 0.3 08/26/2017   ALKPHOS 76 08/26/2017   AST 42 (H) 08/26/2017   ALT 33 08/26/2017   PROT 7.6 08/26/2017   ALBUMIN 2.5 (L) 08/26/2017   CALCIUM 9.6 11/21/2021   ANIONGAP 10 09/17/2019   Lab Results  Component Value Date   CHOL 122 11/21/2021   Lab Results  Component Value Date   HDL 12 (L) 11/21/2021   Lab Results  Component Value Date    LDLCALC 83 11/21/2021   Lab Results  Component Value Date   TRIG 172 (H) 11/21/2021   Lab Results  Component Value Date   CHOLHDL 10.2 (H) 11/21/2021   No results found for: HGBA1C    Assessment & Plan:   Problem List Items Addressed This Visit  Cardiovascular and Mediastinum   Hypertension    BP 140/78 today. Advise pt to follow a heart healthy and low salt diet. Increase fluid intake to 6-8 glasses per day.  Advise pt to be physically active. Continue the current medication amlodipine, lisinopril-HTCZ, metoprolol. Lab ordered       Relevant Orders   CBC with Differential/Platelet (Completed)   Basic metabolic panel (Completed)   Microalbumin, urine (Completed)   TSH     Other   Tobacco use disorder    Pt is a former smoker, quit smoking in 2018.      HLD (hyperlipidemia)    -Advise pt to follow a low fat and carbohydrates diet. -Increase physical activity. -Lab ordered      Relevant Orders   Lipid panel (Completed)   Hx of BKA, right (HCC)    No pain with the prothesis.  Stable at present      Need for hepatitis C screening test   Relevant Orders   Hepatitis C Antibody   Other Visit Diagnoses     Need for influenza vaccination    -  Primary   Relevant Orders   Flu Vaccine QUAD High Dose(Fluad) (Completed)   Encounter for screening for diabetes mellitus           No orders of the defined types were placed in this encounter.  This patient was seen by Theresia Lo, NP in collaboration with Dr. Cletis Athens as part of collaborative practice agreement.   Follow-up: No follow-ups on file.    Theresia Lo, NP

## 2021-11-22 LAB — BASIC METABOLIC PANEL
BUN/Creatinine Ratio: 13 (calc) (ref 6–22)
BUN: 23 mg/dL (ref 7–25)
CO2: 18 mmol/L — ABNORMAL LOW (ref 20–32)
Calcium: 9.6 mg/dL (ref 8.6–10.3)
Chloride: 107 mmol/L (ref 98–110)
Creat: 1.75 mg/dL — ABNORMAL HIGH (ref 0.70–1.28)
Glucose, Bld: 87 mg/dL (ref 65–99)
Potassium: 4.6 mmol/L (ref 3.5–5.3)
Sodium: 136 mmol/L (ref 135–146)

## 2021-11-22 LAB — LIPID PANEL
Cholesterol: 122 mg/dL (ref <200)
HDL: 12 mg/dL — ABNORMAL LOW (ref >=40)
LDL Cholesterol (Calc): 83 mg/dL (calc)
Non-HDL Cholesterol (Calc): 110 mg/dL (calc) (ref <130)
Total CHOL/HDL Ratio: 10.2 (calc) — ABNORMAL HIGH (ref <5.0)
Triglycerides: 172 mg/dL — ABNORMAL HIGH (ref <150)

## 2021-11-22 LAB — HEPATITIS C ANTIBODY
Hepatitis C Ab: NONREACTIVE
SIGNAL TO CUT-OFF: 0.04 (ref <1.00)

## 2021-11-22 LAB — CBC WITH DIFFERENTIAL/PLATELET
Absolute Monocytes: 693 cells/uL (ref 200–950)
Basophils Absolute: 198 cells/uL (ref 0–200)
Basophils Relative: 1.8 %
Eosinophils Absolute: 308 cells/uL (ref 15–500)
Eosinophils Relative: 2.8 %
HCT: 39.9 % (ref 38.5–50.0)
Hemoglobin: 13.1 g/dL — ABNORMAL LOW (ref 13.2–17.1)
Lymphs Abs: 5016 cells/uL — ABNORMAL HIGH (ref 850–3900)
MCH: 28.7 pg (ref 27.0–33.0)
MCHC: 32.8 g/dL (ref 32.0–36.0)
MCV: 87.3 fL (ref 80.0–100.0)
MPV: 9.9 fL (ref 7.5–12.5)
Monocytes Relative: 6.3 %
Neutro Abs: 4785 cells/uL (ref 1500–7800)
Neutrophils Relative %: 43.5 %
Platelets: 227 10*3/uL (ref 140–400)
RBC: 4.57 10*6/uL (ref 4.20–5.80)
RDW: 14.6 % (ref 11.0–15.0)
Total Lymphocyte: 45.6 %
WBC: 11 10*3/uL — ABNORMAL HIGH (ref 3.8–10.8)

## 2021-11-22 LAB — MICROALBUMIN, URINE: Microalb, Ur: 1.6 mg/dL

## 2021-11-22 LAB — TSH: TSH: 0.72 mIU/L (ref 0.40–4.50)

## 2021-11-22 NOTE — Assessment & Plan Note (Addendum)
No pain with the prothesis.  Stable at present

## 2021-11-22 NOTE — Assessment & Plan Note (Deleted)
Pt had

## 2021-11-22 NOTE — Assessment & Plan Note (Signed)
Pt is a former smoker, quit smoking in 2018.

## 2021-12-07 ENCOUNTER — Ambulatory Visit (INDEPENDENT_AMBULATORY_CARE_PROVIDER_SITE_OTHER): Payer: Medicare Other | Admitting: Nurse Practitioner

## 2021-12-07 ENCOUNTER — Encounter: Payer: Self-pay | Admitting: Nurse Practitioner

## 2021-12-07 ENCOUNTER — Other Ambulatory Visit: Payer: Self-pay

## 2021-12-07 VITALS — BP 138/79 | HR 60 | Ht 66.0 in | Wt 153.1 lb

## 2021-12-07 DIAGNOSIS — I10 Essential (primary) hypertension: Secondary | ICD-10-CM | POA: Diagnosis not present

## 2021-12-07 DIAGNOSIS — I739 Peripheral vascular disease, unspecified: Secondary | ICD-10-CM | POA: Diagnosis not present

## 2021-12-07 DIAGNOSIS — E785 Hyperlipidemia, unspecified: Secondary | ICD-10-CM | POA: Diagnosis not present

## 2021-12-07 NOTE — Progress Notes (Signed)
Established Patient Office Visit  Subjective:  Patient ID: Jordan Mills, male    DOB: Jun 07, 1951  Age: 71 y.o. MRN: [id number]  CC:  Chief Complaint  Patient presents with   lab results    Patient here for labs results drawn 11/21/21     HPI  Jordan Mills presents for labs review.     Past Medical History:  Diagnosis Date   Gout    HLD (hyperlipidemia)    Hypertension    Peripheral vascular disease ([place])     Past Surgical History:  Procedure Laterality Date   AMPUTATION Right 08/28/2017   Procedure: AMPUTATION BELOW KNEE;  Surgeon: Algernon Huxley, MD;  Location: [facility];  Service: Vascular;  Laterality: Right;   AMPUTATION TOE Right 07/19/2017   Procedure: AMPUTATION TOE/Rt 4th MPJ;  Surgeon: Sharlotte Alamo, DPM;  Location: [facility];  Service: Podiatry;  Laterality: Right;   LOWER EXTREMITY ANGIOGRAPHY Right 07/01/2017   Procedure: Lower Extremity Angiography;  Surgeon: Algernon Huxley, MD;  Location: [place] [facility];  Service: Cardiovascular;  Laterality: Right;   LOWER EXTREMITY ANGIOGRAPHY Right 08/15/2017   Procedure: Lower Extremity Angiography;  Surgeon: Algernon Huxley, MD;  Location: [place] [facility];  Service: Cardiovascular;  Laterality: Right;   LOWER EXTREMITY ANGIOGRAPHY Right 08/16/2017   Procedure: LOWER EXTREMITY ANGIOGRAPHY;  Surgeon: Algernon Huxley, MD;  Location: [place] [facility];  Service: Cardiovascular;  Laterality: Right;   LOWER EXTREMITY INTERVENTION  08/15/2017   Procedure: LOWER EXTREMITY INTERVENTION;  Surgeon: Algernon Huxley, MD;  Location: [place] [facility];  Service: Cardiovascular;;   TONSILLECTOMY      Family History  Problem Relation Age of Onset   Hypertension Mother    Hyperlipidemia Mother    Diabetes Mother    Stroke Mother    Lung cancer Sister     Social History   Socioeconomic History   Marital status: Married    Spouse name: Not on file   Number of children: Not on file   Years of education: Not on file    Highest education level: Not on file  Occupational History   Not on file  Tobacco Use   Smoking status: Former    Packs/day: 0.20    Types: Cigarettes    Quit date: 06/13/2017    Years since quitting: 4.4   Smokeless tobacco: Never  Vaping Use   Vaping Use: Former  Substance and Sexual Activity   Alcohol use: No   Drug use: No   Sexual activity: Not on file  Other Topics Concern   Not on file  Social History Narrative   Not on file   Social Determinants of Health   Financial Resource Strain: Not on file  Food Insecurity: Not on file  Transportation Needs: Not on file  Physical Activity: Not on file  Stress: Not on file  Social Connections: Not on file  Intimate Partner Violence: Not on file     Outpatient Medications Prior to Visit  Medication Sig Dispense Refill   amLODipine (NORVASC) 5 MG tablet TAKE 1 TABLET BY MOUTH TWICE A DAY 180 tablet 2   aspirin EC 81 MG tablet Take by mouth.     atorvastatin (LIPITOR) 10 MG tablet TAKE 1 TABLET BY MOUTH DAILY 90 tablet 3   clopidogrel (PLAVIX) 75 MG tablet TAKE 1 TABLET BY MOUTH DAILY 90 tablet 3   lisinopril-hydrochlorothiazide (ZESTORETIC) 20-12.5 MG tablet TAKE 2 TABLETS BY MOUTH EVERY DAY 180 tablet  3   metoprolol tartrate (LOPRESSOR) 50 MG tablet TAKE 1 TABLET BY MOUTH TWICE A DAY 180 tablet 1   multivitamin-iron-minerals-folic acid (CENTRUM) chewable tablet Chew 1 tablet by mouth daily.      No facility-administered medications prior to visit.    No Known Allergies  ROS Review of Systems  Constitutional:  Negative for activity change and appetite change.  HENT:  Negative for congestion.   Eyes: Negative.   Respiratory:  Negative for chest tightness and shortness of breath.   Cardiovascular:  Negative for chest pain.  Gastrointestinal:  Negative for abdominal distention and abdominal pain.  Genitourinary:  Negative for difficulty urinating.  Musculoskeletal: Negative.   Skin:  Negative for color change and  pallor.  Neurological:  Negative for dizziness, light-headedness and headaches.  Psychiatric/Behavioral:  Negative for agitation, behavioral problems and confusion.      Objective:    Physical Exam Constitutional:      Appearance: Normal appearance. He is normal weight.  HENT:     Head: Normocephalic.     Nose: Nose normal.  Eyes:     Pupils: Pupils are equal, round, and reactive to light.  Cardiovascular:     Rate and Rhythm: Normal rate and regular rhythm.  Pulmonary:     Effort: Pulmonary effort is normal.     Breath sounds: Normal breath sounds.  Abdominal:     General: Abdomen is flat. Bowel sounds are normal.     Palpations: Abdomen is soft.  Skin:    Capillary Refill: Capillary refill takes less than 2 seconds.  Neurological:     General: No focal deficit present.     Mental Status: He is alert and oriented to person, place, and time. Mental status is at baseline.  Psychiatric:        Mood and Affect: Mood normal.        Behavior: Behavior normal.        Thought Content: Thought content normal.        Judgment: Judgment normal.    BP 138/79    Pulse 60    Ht 5\' 6"  (1.676 m)    Wt 153 lb 1.6 oz (69.4 kg)    BMI 24.71 kg/m  Wt Readings from Last 3 Encounters:  12/07/21 153 lb 1.6 oz (69.4 kg)  11/21/21 154 lb 8 oz (70.1 kg)  07/26/20 154 lb (69.9 kg)     Health Maintenance Due  Topic Date Due   COVID-19 Vaccine (1) Never done   Pneumonia Vaccine 40+ Years old (2 - PCV) 08/18/2018    There are no preventive care reminders to display for this patient.  Lab Results  Component Value Date   TSH 0.72 11/21/2021   Lab Results  Component Value Date   WBC 11.0 (H) 11/21/2021   HGB 13.1 (L) 11/21/2021   HCT 39.9 11/21/2021   MCV 87.3 11/21/2021   PLT 227 11/21/2021   Lab Results  Component Value Date   NA 136 11/21/2021   K 4.6 11/21/2021   CO2 18 (L) 11/21/2021   GLUCOSE 87 11/21/2021   BUN 23 11/21/2021   CREATININE 1.75 (H) 11/21/2021   BILITOT  0.3 08/26/2017   ALKPHOS 76 08/26/2017   AST 42 (H) 08/26/2017   ALT 33 08/26/2017   PROT 7.6 08/26/2017   ALBUMIN 2.5 (L) 08/26/2017   CALCIUM 9.6 11/21/2021   ANIONGAP 10 09/17/2019   Lab Results  Component Value Date   CHOL 122 11/21/2021   Lab Results  Component Value Date   HDL 12 (L) 11/21/2021   Lab Results  Component Value Date   LDLCALC 83 11/21/2021   Lab Results  Component Value Date   TRIG 172 (H) 11/21/2021   Lab Results  Component Value Date   CHOLHDL 10.2 (H) 11/21/2021   No results found for: HGBA1C    Assessment & Plan:   Problem List Items Addressed This Visit       Cardiovascular and Mediastinum   Hypertension - Primary    BP stable. Continue the current treatment regimen.       Peripheral vascular disease ([place])    Continue antiplatelet therapy.          Other   HLD (hyperlipidemia)    Pt has low HDL 12, borderline high triglycerides 172 and normal LDL. Continue lipitor 10 mg. Advised pt to watch diet.  Limit the intake of fried and fatty food. Follow a regular physical activity schedule.           No orders of the defined types were placed in this encounter.    Follow-up: No follow-ups on file.    Theresia Lo, NP

## 2021-12-09 NOTE — Assessment & Plan Note (Signed)
BP stable. Continue the current treatment regimen.

## 2021-12-09 NOTE — Assessment & Plan Note (Signed)
Pt has low HDL 12, borderline high triglycerides 172 and normal LDL. Continue lipitor 10 mg. Advised pt to watch diet.  Limit the intake of fried and fatty food. Follow a regular physical activity schedule.

## 2021-12-09 NOTE — Assessment & Plan Note (Signed)
Continue antiplatelet therapy.

## 2021-12-14 DIAGNOSIS — Z20822 Contact with and (suspected) exposure to covid-19: Secondary | ICD-10-CM | POA: Diagnosis not present

## 2021-12-25 DIAGNOSIS — Z20828 Contact with and (suspected) exposure to other viral communicable diseases: Secondary | ICD-10-CM | POA: Diagnosis not present

## 2022-01-05 DIAGNOSIS — Z20822 Contact with and (suspected) exposure to covid-19: Secondary | ICD-10-CM | POA: Diagnosis not present

## 2022-01-29 DIAGNOSIS — Z20822 Contact with and (suspected) exposure to covid-19: Secondary | ICD-10-CM | POA: Diagnosis not present

## 2022-02-02 ENCOUNTER — Ambulatory Visit: Payer: Medicare Other | Admitting: Nurse Practitioner

## 2022-02-09 ENCOUNTER — Ambulatory Visit (INDEPENDENT_AMBULATORY_CARE_PROVIDER_SITE_OTHER): Payer: Medicare Other | Admitting: Nurse Practitioner

## 2022-02-09 ENCOUNTER — Encounter: Payer: Self-pay | Admitting: Nurse Practitioner

## 2022-02-09 VITALS — BP 108/60 | HR 51 | Ht 66.0 in | Wt 155.2 lb

## 2022-02-09 DIAGNOSIS — Z89511 Acquired absence of right leg below knee: Secondary | ICD-10-CM

## 2022-02-09 DIAGNOSIS — E785 Hyperlipidemia, unspecified: Secondary | ICD-10-CM | POA: Diagnosis not present

## 2022-02-09 DIAGNOSIS — I739 Peripheral vascular disease, unspecified: Secondary | ICD-10-CM

## 2022-02-09 DIAGNOSIS — I10 Essential (primary) hypertension: Secondary | ICD-10-CM | POA: Diagnosis not present

## 2022-02-09 NOTE — Progress Notes (Signed)
? ?Established Patient Office Visit ? ?Subjective:  ?Patient ID: Jordan Mills, male    DOB: 05/27/1951  Age: 71 y.o. MRN: [id number] ? ?CC:  ?Chief Complaint  ?Patient presents with  ? Hypertension  ?  Patient is here for a 2 month BP follow up.   ? ? ? ?HPI ?Jordan Mills is in for a routine follow up. He has no complaints at present. Overall he is doing fine. He is retired and lives with his wife.  ? ? ? ?Past Medical History:  ?Diagnosis Date  ? Gout   ? HLD (hyperlipidemia)   ? Hypertension   ? Peripheral vascular disease ([place])   ? ? ?Past Surgical History:  ?Procedure Laterality Date  ? AMPUTATION Right 08/28/2017  ? Procedure: AMPUTATION BELOW KNEE;  Surgeon: Algernon Huxley, MD;  Location: [facility];  Service: Vascular;  Laterality: Right;  ? AMPUTATION TOE Right 07/19/2017  ? Procedure: AMPUTATION TOE/Rt 4th MPJ;  Surgeon: Sharlotte Alamo, DPM;  Location: [facility];  Service: Podiatry;  Laterality: Right;  ? LOWER EXTREMITY ANGIOGRAPHY Right 07/01/2017  ? Procedure: Lower Extremity Angiography;  Surgeon: Algernon Huxley, MD;  Location: [place] [facility];  Service: Cardiovascular;  Laterality: Right;  ? LOWER EXTREMITY ANGIOGRAPHY Right 08/15/2017  ? Procedure: Lower Extremity Angiography;  Surgeon: Algernon Huxley, MD;  Location: [place] [facility];  Service: Cardiovascular;  Laterality: Right;  ? LOWER EXTREMITY ANGIOGRAPHY Right 08/16/2017  ? Procedure: LOWER EXTREMITY ANGIOGRAPHY;  Surgeon: Algernon Huxley, MD;  Location: [place] [facility];  Service: Cardiovascular;  Laterality: Right;  ? LOWER EXTREMITY INTERVENTION  08/15/2017  ? Procedure: LOWER EXTREMITY INTERVENTION;  Surgeon: Algernon Huxley, MD;  Location: [place] [facility];  Service: Cardiovascular;;  ? TONSILLECTOMY    ? ? ?Family History  ?Problem Relation Age of Onset  ? Hypertension Mother   ? Hyperlipidemia Mother   ? Diabetes Mother   ? Stroke Mother   ? Lung cancer Sister   ? ? ?Social History  ? ?Socioeconomic History  ? Marital status: Married   ?  Spouse name: Not on file  ? Number of children: Not on file  ? Years of education: Not on file  ? Highest education level: Not on file  ?Occupational History  ? Not on file  ?Tobacco Use  ? Smoking status: Former  ?  Packs/day: 0.20  ?  Types: Cigarettes  ?  Quit date: 06/13/2017  ?  Years since quitting: 4.6  ? Smokeless tobacco: Never  ?Vaping Use  ? Vaping Use: Former  ?Substance and Sexual Activity  ? Alcohol use: No  ? Drug use: No  ? Sexual activity: Not on file  ?Other Topics Concern  ? Not on file  ?Social History Narrative  ? Not on file  ? ?Social Determinants of Health  ? ?Financial Resource Strain: Not on file  ?Food Insecurity: Not on file  ?Transportation Needs: Not on file  ?Physical Activity: Not on file  ?Stress: Not on file  ?Social Connections: Not on file  ?Intimate Partner Violence: Not on file  ? ? ? ?Outpatient Medications Prior to Visit  ?Medication Sig Dispense Refill  ? amLODipine (NORVASC) 5 MG tablet TAKE 1 TABLET BY MOUTH TWICE A DAY 180 tablet 2  ? aspirin EC 81 MG tablet Take by mouth.    ? atorvastatin (LIPITOR) 10 MG tablet TAKE 1 TABLET BY MOUTH DAILY 90 tablet 3  ? clopidogrel (PLAVIX) 75 MG tablet TAKE  1 TABLET BY MOUTH DAILY 90 tablet 3  ? lisinopril-hydrochlorothiazide (ZESTORETIC) 20-12.5 MG tablet TAKE 2 TABLETS BY MOUTH EVERY DAY 180 tablet 3  ? metoprolol tartrate (LOPRESSOR) 50 MG tablet TAKE 1 TABLET BY MOUTH TWICE A DAY 180 tablet 1  ? multivitamin-iron-minerals-folic acid (CENTRUM) chewable tablet Chew 1 tablet by mouth daily.     ? ?No facility-administered medications prior to visit.  ? ? ?No Known Allergies ? ?ROS ?Review of Systems  ?Constitutional:  Negative for activity change and appetite change.  ?HENT:  Negative for congestion.   ?Eyes: Negative.   ?Respiratory:  Negative for chest tightness and shortness of breath.   ?Cardiovascular:  Negative for chest pain.  ?Gastrointestinal:  Negative for abdominal distention and abdominal pain.  ?Genitourinary:   Negative for difficulty urinating.  ?Musculoskeletal: Negative.   ?Skin:  Negative for color change and pallor.  ?Neurological:  Negative for dizziness, light-headedness and headaches.  ?Psychiatric/Behavioral:  Negative for agitation, behavioral problems and confusion.   ? ?  ?Objective:  ?  ?Physical Exam ?Constitutional:   ?   Appearance: Normal appearance. He is normal weight.  ?HENT:  ?   Head: Normocephalic.  ?   Right Ear: Tympanic membrane normal.  ?   Left Ear: Tympanic membrane normal.  ?   Nose: Nose normal.  ?Eyes:  ?   Extraocular Movements: Extraocular movements intact.  ?   Conjunctiva/sclera: Conjunctivae normal.  ?   Pupils: Pupils are equal, round, and reactive to light.  ?Cardiovascular:  ?   Rate and Rhythm: Normal rate and regular rhythm.  ?   Pulses: Normal pulses.  ?   Heart sounds: Normal heart sounds.  ?Pulmonary:  ?   Effort: Pulmonary effort is normal.  ?   Breath sounds: Normal breath sounds.  ?Abdominal:  ?   General: Abdomen is flat. Bowel sounds are normal.  ?   Palpations: Abdomen is soft.  ?Musculoskeletal:     ?   General: Normal range of motion.  ?   Comments: Left BKA  ?Skin: ?   General: Skin is warm.  ?   Capillary Refill: Capillary refill takes less than 2 seconds.  ?Neurological:  ?   General: No focal deficit present.  ?   Mental Status: He is alert and oriented to person, place, and time. Mental status is at baseline.  ?Psychiatric:     ?   Mood and Affect: Mood normal.     ?   Behavior: Behavior normal.     ?   Thought Content: Thought content normal.     ?   Judgment: Judgment normal.  ? ? ?BP 108/60   Pulse (!) 51   Ht '5\' 6"'$  (1.676 m)   Wt 155 lb 3.2 oz (70.4 kg)   BMI 25.05 kg/m?  ?Wt Readings from Last 3 Encounters:  ?02/09/22 155 lb 3.2 oz (70.4 kg)  ?12/07/21 153 lb 1.6 oz (69.4 kg)  ?11/21/21 154 lb 8 oz (70.1 kg)  ? ? ? ?Health Maintenance Due  ?Topic Date Due  ? COVID-19 Vaccine (1) Never done  ? Pneumonia Vaccine 16+ Years old (2 - PCV) 08/18/2018  ? ? ?There  are no preventive care reminders to display for this patient. ? ?Lab Results  ?Component Value Date  ? TSH 0.72 11/21/2021  ? ?Lab Results  ?Component Value Date  ? WBC 11.0 (H) 11/21/2021  ? HGB 13.1 (L) 11/21/2021  ? HCT 39.9 11/21/2021  ? MCV 87.3 11/21/2021  ?  PLT 227 11/21/2021  ? ?Lab Results  ?Component Value Date  ? NA 136 11/21/2021  ? K 4.6 11/21/2021  ? CO2 18 (L) 11/21/2021  ? GLUCOSE 87 11/21/2021  ? BUN 23 11/21/2021  ? CREATININE 1.75 (H) 11/21/2021  ? BILITOT 0.3 08/26/2017  ? ALKPHOS 76 08/26/2017  ? AST 42 (H) 08/26/2017  ? ALT 33 08/26/2017  ? PROT 7.6 08/26/2017  ? ALBUMIN 2.5 (L) 08/26/2017  ? CALCIUM 9.6 11/21/2021  ? ANIONGAP 10 09/17/2019  ? ?Lab Results  ?Component Value Date  ? CHOL 122 11/21/2021  ? ?Lab Results  ?Component Value Date  ? HDL 12 (L) 11/21/2021  ? ?Lab Results  ?Component Value Date  ? [place] 83 11/21/2021  ? ?Lab Results  ?Component Value Date  ? TRIG 172 (H) 11/21/2021  ? ?Lab Results  ?Component Value Date  ? CHOLHDL 10.2 (H) 11/21/2021  ? ?No results found for: HGBA1C ? ?  ?Assessment & Plan:  ? ?Problem List Items Addressed This Visit   ? ?  ? Cardiovascular and Mediastinum  ? Hypertension - Primary  ?  BP stable 108/60. ?Continue the current medication.  ? ?  ?  ? Peripheral vascular disease ([place])  ?  Stable. ?Patient states he is not taking Plavix any more. ? ?  ?  ?  ? Other  ? HLD (hyperlipidemia)  ?  Continue atorvastatin 10 mg. ?Advised  patient to limit the intake of fried and fatty food. ?Eat balanced diet and lose weight. ?4 -7 minutes spent on nutrition counseling.  ? ? ? ? ? ?  ?  ? ? ?No orders of the defined types were placed in this encounter. ? ? ? ?Follow-up: No follow-ups on file.  ? ? ?Theresia Lo, NP ?

## 2022-02-09 NOTE — Assessment & Plan Note (Signed)
BP stable 108/60. ?Continue the current medication.  ?

## 2022-02-09 NOTE — Assessment & Plan Note (Addendum)
Continue atorvastatin 10 mg. ?Advised  patient to limit the intake of fried and fatty food. ?Eat balanced diet and lose weight. ?4 -7 minutes spent on nutrition counseling.  ? ? ? ? ?

## 2022-02-13 DIAGNOSIS — Z20822 Contact with and (suspected) exposure to covid-19: Secondary | ICD-10-CM | POA: Diagnosis not present

## 2022-02-18 ENCOUNTER — Encounter: Payer: Self-pay | Admitting: Nurse Practitioner

## 2022-02-18 NOTE — Assessment & Plan Note (Signed)
Stable. ?Patient states he is not taking Plavix any more. ?

## 2022-02-18 NOTE — Assessment & Plan Note (Deleted)
Stable

## 2022-02-19 DIAGNOSIS — Z20822 Contact with and (suspected) exposure to covid-19: Secondary | ICD-10-CM | POA: Diagnosis not present

## 2022-03-29 ENCOUNTER — Ambulatory Visit (INDEPENDENT_AMBULATORY_CARE_PROVIDER_SITE_OTHER): Payer: Medicare Other | Admitting: *Deleted

## 2022-03-29 DIAGNOSIS — Z0100 Encounter for examination of eyes and vision without abnormal findings: Secondary | ICD-10-CM

## 2022-03-29 DIAGNOSIS — Z0000 Encounter for general adult medical examination without abnormal findings: Secondary | ICD-10-CM

## 2022-03-29 NOTE — Progress Notes (Signed)
Subjective:   Jordan Mills is a 71 y.o. male who presents for an Initial Medicare Annual Wellness Visit.  I discussed the limitations of evaluation and management by telemedicine and the availability of in person appointments. Patient expressed understanding and agreed to proceed.   Visit performed using audio  Patient:home Provider:home    Review of Systems    Defer to provider Cardiac Risk Factors include: male gender;advanced age (>43mn, >>48women)     Objective:    There were no vitals filed for this visit. There is no height or weight on file to calculate BMI.     03/29/2022   10:12 AM 09/17/2019    3:25 PM 03/06/2018    1:12 PM 08/28/2017    8:37 AM 08/25/2017    4:14 PM 08/25/2017    9:01 AM 08/16/2017    3:30 PM  Advanced Directives  Does Patient Have a Medical Advance Directive? No No No No No No No  Would patient like information on creating a medical advance directive? No - Patient declined  Yes (MAU/Ambulatory/Procedural Areas - Information given) No - Patient declined No - Patient declined No - Patient declined No - Patient declined    Current Medications (verified) Outpatient Encounter Medications as of 03/29/2022  Medication Sig   amLODipine (NORVASC) 5 MG tablet TAKE 1 TABLET BY MOUTH TWICE A DAY   aspirin EC 81 MG tablet Take by mouth.   atorvastatin (LIPITOR) 10 MG tablet TAKE 1 TABLET BY MOUTH DAILY   clopidogrel (PLAVIX) 75 MG tablet TAKE 1 TABLET BY MOUTH DAILY   lisinopril-hydrochlorothiazide (ZESTORETIC) 20-12.5 MG tablet TAKE 2 TABLETS BY MOUTH EVERY DAY   metoprolol tartrate (LOPRESSOR) 50 MG tablet TAKE 1 TABLET BY MOUTH TWICE A DAY   multivitamin-iron-minerals-folic acid (CENTRUM) chewable tablet Chew 1 tablet by mouth daily.    No facility-administered encounter medications on file as of 03/29/2022.    Allergies (verified) Patient has no known allergies.   History: Past Medical History:  Diagnosis Date   Gout    HLD  (hyperlipidemia)    Hypertension    [place]ipheral vascular disease ([place]    Past Surgical History:  Procedure Laterality Date   AMPUTATION Right 08/28/2017   Procedure: AMPUTATION BELOW KNEE;  Surgeon: DAlgernon Huxley MD;  Location: [facility];  Service: Vascular;  Laterality: Right;   AMPUTATION TOE Right 07/19/2017   Procedure: AMPUTATION TOE/Rt 4th MPJ;  Surgeon: CSharlotte Alamo DPM;  Location: [facility] OR[facility];  Service: Podiatry;  Laterality: Right;   LOWER EXTREMITY ANGIOGRAPHY Right 07/01/2017   Procedure: Lower Extremity Angiography;  Surgeon: DAlgernon Huxley MD;  [facility];  Service: Cardiovascular;  Laterality: Right;   LOWER EXTREMITY ANGIOGRAPHY Right 08/15/2017   Procedure: Lower Extremity Angiography;  Surgeon: DAlgernon Huxley MD;  [facility];  Service: Cardiovascular;  Laterality: Right;   LOWER EXTREMITY ANGIOGRAPHY Right 08/16/2017   Procedure: LOWER EXTREMITY ANGIOGRAPHY;  Surgeon: DAlgernon Huxley MD;  [facility];  Service: Cardiovascular;  Laterality: Right;   LOWER EXTREMITY INTERVENTION  08/15/2017   Procedure: LOWER EXTREMITY INTERVENTION;  Surgeon: DAlgernon Huxley MD;  [facility];  Service: Cardiovascular;;   TONSILLECTOMY     Family History  Problem Relation Age of Onset   Hypertension Mother    Hyperlipidemia Mother    Diabetes Mother    Stroke Mother    Lung cancer Sister    Social History   Socioeconomic History  Marital status: Married    Spouse name: Not on file   Number of children: Not on file   Years of education: Not on file   Highest education level: Not on file  Occupational History   Not on file  Tobacco Use   Smoking status: Former    Packs/day: 0.20    Types: Cigarettes    Quit date: 06/13/2017    Years since quitting: 4.7   Smokeless tobacco: Never  Vaping Use   Vaping Use: Former  Substance and Sexual Activity   Alcohol use: No   Drug use: No   Sexual activity: Not on file   Other Topics Concern   Not on file  Social History Narrative   Not on file   Social Determinants of Health   Financial Resource Strain: Low Risk  (03/29/2022)   Overall Financial Resource Strain (CARDIA)    Difficulty of Paying Living Expenses: Not very hard  Food Insecurity: No Food Insecurity (03/29/2022)   Hunger Vital Sign    Worried About Running Out of Food in the Last Year: Never true    [place] in the Last Year: Never true  Transportation Needs: No Transportation Needs (03/29/2022)   PRAPARE - Hydrologist (Medical): No    Lack of Transportation (Non-Medical): No  Physical Activity: Insufficiently Active (03/29/2022)   Exercise Vital Sign    Days of Exercise per Week: 4 days    Minutes of Exercise per Session: 30 min  Stress: No Stress Concern Present (03/29/2022)   [place]    Feeling of Stress : Not at all  Social Connections: Moderately Isolated (03/29/2022)   Social Connection and Isolation Panel [NHANES]    Frequency of Communication with Friends and Family: More than three times a week    Frequency of Social Gatherings with Friends and Family: More than three times a week    Attends Religious Services: Never    Marine scientist or Organizations: No    Attends Music therapist: Never    Marital Status: Married    Tobacco Counseling Counseling given: Not Answered   Clinical Intake:  Pre-visit preparation completed: Yes  Pain : No/denies pain     Nutritional Risks: None Diabetes: No  How often do you need to have someone help you when you read instructions, pamphlets, or other written materials from your doctor or pharmacy?: 1 - Never What is the last grade level you completed in school?: 11  Diabetic?no  Interpreter Needed?: No  Information entered by :: Lacretia Nicks CMA   Activities of Daily Living    03/29/2022   10:14 AM   In your present state of health, do you have any difficulty performing the following activities:  Hearing? 0  Vision? 0  Difficulty concentrating or making decisions? 0  Walking or climbing stairs? 1  Dressing or bathing? 0  Doing errands, shopping? 0  Preparing Food and eating ? N  Using the Toilet? N  In the past six months, have you accidently leaked urine? N  Do you have problems with loss of bowel control? N  Managing your Medications? N  Managing your Finances? N  Housekeeping or managing your Housekeeping? N    Patient Care Team: Cletis Athens, MD as PCP - General (Internal Medicine)  Indicate any recent Medical Services you may have received from other than [facility] providers in the past year (date may  be approximate).     Assessment:   This is a routine wellness examination for Kymir.  Hearing/Vision screen No results found.  Dietary issues and exercise activities discussed: Current Exercise Habits: Home exercise routine, Type of exercise: walking, Time (Minutes): 30, Frequency (Times/Week): 4, Weekly Exercise (Minutes/Week): 120, Intensity: Mild   Goals Addressed   None    Depression Screen    03/29/2022   10:14 AM 03/29/2022   10:10 AM 11/21/2021   11:35 AM  PHQ 2/9 Scores  PHQ - 2 Score 0 0 0    Fall Risk    03/29/2022   10:13 AM 11/21/2021   11:35 AM 05/16/2018    5:02 PM  [place] in the past year? 0 0 No  Comment   [place] Telephone Survey: data to providers prior to load  Number falls in past yr: 0 0   Injury with Fall? 0 0   Risk for fall due to : No Fall Risks No Fall Risks   Follow up Falls evaluation completed Falls evaluation completed     [place]:  Any stairs in or around the home? No  If so, are there any without handrails? No  Home free of loose throw rugs in walkways, pet beds, electrical cords, etc? Yes  Adequate lighting in your home to reduce risk of falls? Yes   ASSISTIVE DEVICES UTILIZED TO  PREVENT FALLS:  Life alert? No  Use of a cane, walker or w/c? No  Grab bars in the bathroom? No  Shower chair or bench in shower? No  Elevated toilet seat or a handicapped toilet? No   TIMED UP AND GO:  Was the test performed? No .  Length of time to ambulate- NA    Cognitive Function:    03/29/2022   10:15 AM  MMSE - Mini Mental State Exam  Not completed: Unable to complete        03/29/2022   10:15 AM  6CIT Screen  What Year? 0 points  What month? 0 points  What time? 0 points  Count back from 20 0 points  Months in reverse 0 points  Repeat phrase 0 points  Total Score 0 points    Immunizations Immunization History  Administered Date(s) Administered   Fluad Quad(high Dose 65+) 11/21/2021   Influenza, High Dose Seasonal PF 08/18/2017   Pneumococcal Polysaccharide-23 08/18/2017    TDAP status: Up to date  Flu Vaccine status: Up to date  Pneumococcal vaccine status: Due, Education has been provided regarding the importance of this vaccine. Advised may receive this vaccine at local pharmacy or Health Dept. Aware to provide a copy of the vaccination record if obtained from local pharmacy or Health Dept. Verbalized acceptance and understanding.  Covid-19 vaccine status: Declined, Education has been provided regarding the importance of this vaccine but patient still declined. Advised may receive this vaccine at local pharmacy or Health Dept.or vaccine clinic. Aware to provide a copy of the vaccination record if obtained from local pharmacy or Health Dept. Verbalized acceptance and understanding.     Screening Tests Health Maintenance  Topic Date Due   Zoster Vaccines- Shingrix (1 of 2) Never done   Pneumonia Vaccine 43+ Years old (2 - PCV) 08/18/2018   COVID-19 Vaccine (1) 04/14/2022 (Originally 07/06/1951)   COLONOSCOPY (Pts 45-17yr Insurance coverage will need to be confirmed)  11/21/2022 (Originally 01/03/1994)   TETANUS/TDAP  11/21/2022 (Originally [birth year]/04/1968)    INFLUENZA VACCINE  05/15/2022  Hepatitis C Screening  Completed   HPV VACCINES  Aged Out    Health Maintenance  Health Maintenance Due  Topic Date Due   Zoster Vaccines- Shingrix (1 of 2) Never done   Pneumonia Vaccine 8+ Years old (2 - PCV) 08/18/2018    Patient advised that he does need colonoscopy, but he wishes to hold off at this time   Lung Cancer Screening: (Low Dose CT Chest recommended if Age 90-80 years, 30 pack-year currently smoking OR have quit w/in 15years.) does qualify.   Lung Cancer Screening Referral: Patient declines at this time   Additional Screening:  Hepatitis C Screening: does not qualify; Completed 11/21/2021  Vision Screening: Recommended annual ophthalmology exams for early detection of glaucoma and other disorders of the eye. Is the patient up to date with their annual eye exam?  No  Who is the provider or what is the name of the office in which the patient attends annual eye exams? Patient not under care of eye dr  If pt is not established with a provider, would they like to be referred to a provider to establish care?  Referral placed today  .   Dental Screening: Recommended annual dental exams for proper oral hygiene  Community Resource Referral / Chronic Care Management: CRR required this visit?  No   CCM required this visit?  No      Plan:     I have personally reviewed and noted the following in the patient's chart:   Medical and social history Use of alcohol, tobacco or illicit drugs  Current medications and supplements including opioid prescriptions. Patient is not currently taking opioid prescriptions. Functional ability and status Nutritional status Physical activity Advanced directives List of other physicians Hospitalizations, surgeries, and ER visits in previous 12 months Vitals Screenings to include cognitive, depression, and falls Referrals and appointments  In addition, I have reviewed and discussed with patient  certain preventive protocols, quality metrics, and best practice recommendations. A written personalized care plan for preventive services as well as general preventive health recommendations were provided to patient.     Lacretia Nicks, [place]   03/29/2022   Nurse Notes:  Mr. Kreuser , Thank you for taking time to come for your Medicare Wellness Visit. I appreciate your ongoing commitment to your health goals. Please review the following plan we discussed and let me know if I can assist you in the future.   These are the goals we discussed:  Goals   None     This is a list of the screening recommended for you and due dates:  Health Maintenance  Topic Date Due   Zoster (Shingles) Vaccine (1 of 2) Never done   Pneumonia Vaccine (2 - PCV) 08/18/2018   COVID-19 Vaccine (1) 04/14/2022*   Colon Cancer Screening  11/21/2022*   Tetanus Vaccine  11/21/2022*   Flu Shot  05/15/2022   Hepatitis C Screening: USPSTF Recommendation to screen - Ages 18-79 yo.  Completed   HPV Vaccine  Aged Out  *Topic was postponed. The date shown is not the original due date.

## 2022-05-11 ENCOUNTER — Ambulatory Visit: Payer: Medicare Other | Admitting: Nurse Practitioner

## 2022-05-17 ENCOUNTER — Ambulatory Visit (INDEPENDENT_AMBULATORY_CARE_PROVIDER_SITE_OTHER): Payer: Medicare Other | Admitting: Nurse Practitioner

## 2022-05-17 ENCOUNTER — Encounter: Payer: Self-pay | Admitting: Nurse Practitioner

## 2022-05-17 VITALS — BP 132/84 | HR 86 | Ht 66.0 in | Wt 158.0 lb

## 2022-05-17 DIAGNOSIS — E785 Hyperlipidemia, unspecified: Secondary | ICD-10-CM | POA: Diagnosis not present

## 2022-05-17 DIAGNOSIS — I739 Peripheral vascular disease, unspecified: Secondary | ICD-10-CM

## 2022-05-17 DIAGNOSIS — I10 Essential (primary) hypertension: Secondary | ICD-10-CM | POA: Diagnosis not present

## 2022-05-17 MED ORDER — LISINOPRIL-HYDROCHLOROTHIAZIDE 20-12.5 MG PO TABS: 2.0000 | ORAL_TABLET | Freq: Every day | ORAL | 2 refills | Status: DC

## 2022-05-17 MED ORDER — AMLODIPINE BESYLATE 5 MG PO TABS: 5.0000 mg | ORAL_TABLET | Freq: Two times a day (BID) | ORAL | 2 refills | Status: DC

## 2022-05-17 MED ORDER — ATORVASTATIN CALCIUM 10 MG PO TABS: 10.0000 mg | ORAL_TABLET | Freq: Every day | ORAL | 3 refills | Status: DC

## 2022-05-17 NOTE — Assessment & Plan Note (Signed)
Blood pressure 132/84 in the office today. Consume heart healthy and low-salt diet. Continue amlodipine, metoprolol, lisinopril HCTZ. We will continue to monitor.

## 2022-05-17 NOTE — Progress Notes (Signed)
Established Patient Office Visit  Subjective:  Patient ID: Jordan Mills, male    DOB: 06-24-1951  Age: 71 y.o. MRN: [id number]  CC:  Chief Complaint  Patient presents with   Hypertension     HPI Jordan L Mills presents for routine follow-up.  He has history of hypertension,peripheral vascular disease and hyperlipidemia. He has no complaints at present.  Patient is taking amlodipine 5 mg, metoprolol 50 mg, lisinopril HCTZ 20-12.5 mg and atorvastatin 10 mg.  Patient states that he is no longer taking Plavix 75 mg.  He had last visit with the vascular surgeon in 2021.  Advised patient to schedule an appointment with the vascular surgeon.   Past Medical History:  Diagnosis Date   Gout    HLD (hyperlipidemia)    Hypertension    Peripheral vascular disease ([place])     Past Surgical History:  Procedure Laterality Date   AMPUTATION Right 08/28/2017   Procedure: AMPUTATION BELOW KNEE;  Surgeon: Algernon Huxley, MD;  Location: [facility];  Service: Vascular;  Laterality: Right;   AMPUTATION TOE Right 07/19/2017   Procedure: AMPUTATION TOE/Rt 4th MPJ;  Surgeon: Sharlotte Alamo, DPM;  Location: [facility];  Service: Podiatry;  Laterality: Right;   LOWER EXTREMITY ANGIOGRAPHY Right 07/01/2017   Procedure: Lower Extremity Angiography;  Surgeon: Algernon Huxley, MD;  Location: [facility] [facility];  Service: Cardiovascular;  Laterality: Right;   LOWER EXTREMITY ANGIOGRAPHY Right 08/15/2017   Procedure: Lower Extremity Angiography;  Surgeon: Algernon Huxley, MD;  Location: [facility];  Service: Cardiovascular;  Laterality: Right;   LOWER EXTREMITY ANGIOGRAPHY Right 08/16/2017   Procedure: LOWER EXTREMITY ANGIOGRAPHY;  Surgeon: Algernon Huxley, MD;  Location: [facility];  Service: Cardiovascular;  Laterality: Right;   LOWER EXTREMITY INTERVENTION  08/15/2017   Procedure: LOWER EXTREMITY INTERVENTION;  Surgeon: Algernon Huxley, MD;  Location: [place] [facility];  Service: Cardiovascular;;    TONSILLECTOMY      Family History  Problem Relation Age of Onset   Hypertension Mother    Hyperlipidemia Mother    Diabetes Mother    Stroke Mother    Lung cancer Sister     Social History   Socioeconomic History   Marital status: Married    Spouse name: Not on file   Number of children: Not on file   Years of education: Not on file   Highest education level: Not on file  Occupational History   Not on file  Tobacco Use   Smoking status: Former    Packs/day: 0.20    Types: Cigarettes    Quit date: 06/13/2017    Years since quitting: 4.9   Smokeless tobacco: Never  Vaping Use   Vaping Use: Former  Substance and Sexual Activity   Alcohol use: No   Drug use: No   Sexual activity: Not on file  Other Topics Concern   Not on file  Social History Narrative   Not on file   Social Determinants of Health   Financial Resource Strain: Low Risk  (03/29/2022)   Overall Financial Resource Strain (CARDIA)    Difficulty of Paying Living Expenses: Not very hard  Food Insecurity: No Food Insecurity (03/29/2022)   Hunger Vital Sign    Worried About Running Out of Food in the Last Year: Never true    [place] in the Last Year: Never true  Transportation Needs: No Transportation Needs (03/29/2022)   [place] - Transportation  Lack of Transportation (Medical): No    Lack of Transportation (Non-Medical): No  Physical Activity: Insufficiently Active (03/29/2022)   Exercise Vital Sign    Days of Exercise per Week: 4 days    Minutes of Exercise per Session: 30 min  Stress: No Stress Concern Present (03/29/2022)   [place]    Feeling of Stress : Not at all  Social Connections: Moderately Isolated (03/29/2022)   Social Connection and Isolation Panel [NHANES]    Frequency of Communication with Friends and Family: More than three times a week    Frequency of Social Gatherings with Friends and Family: More than three  times a week    Attends Religious Services: Never    Marine scientist or Organizations: No    Attends Archivist Meetings: Never    Marital Status: Married  Human resources officer Violence: Not At Risk (03/29/2022)   Humiliation, Afraid, Rape, and Kick questionnaire    Fear of Current or Ex-Partner: No    Emotionally Abused: No    Physically Abused: No    Sexually Abused: No     Outpatient Medications Prior to Visit  Medication Sig Dispense Refill   aspirin EC 81 MG tablet Take by mouth.     clopidogrel (PLAVIX) 75 MG tablet TAKE 1 TABLET BY MOUTH DAILY 90 tablet 3   metoprolol tartrate (LOPRESSOR) 50 MG tablet TAKE 1 TABLET BY MOUTH TWICE A DAY 180 tablet 1   multivitamin-iron-minerals-folic acid (CENTRUM) chewable tablet Chew 1 tablet by mouth daily.      amLODipine (NORVASC) 5 MG tablet TAKE 1 TABLET BY MOUTH TWICE A DAY 180 tablet 2   atorvastatin (LIPITOR) 10 MG tablet TAKE 1 TABLET BY MOUTH DAILY 90 tablet 3   lisinopril-hydrochlorothiazide (ZESTORETIC) 20-12.5 MG tablet TAKE 2 TABLETS BY MOUTH EVERY DAY 180 tablet 3   No facility-administered medications prior to visit.    No Known Allergies  ROS Review of Systems  Constitutional:  Negative for activity change and appetite change.  HENT:  Negative for congestion.   Eyes: Negative.   Respiratory:  Negative for chest tightness and shortness of breath.   Cardiovascular:  Negative for chest pain.  Gastrointestinal:  Negative for abdominal distention and abdominal pain.  Genitourinary:  Negative for difficulty urinating.  Musculoskeletal: Negative.   Skin:  Negative for color change and pallor.  Neurological:  Negative for dizziness, light-headedness and headaches.  Psychiatric/Behavioral:  Negative for agitation, behavioral problems and confusion.       Objective:    Physical Exam Constitutional:      Appearance: Normal appearance. He is normal weight.  HENT:     Head: Normocephalic.     Right Ear:  Tympanic membrane normal.     Left Ear: Tympanic membrane normal.     Nose: Nose normal.  Eyes:     Extraocular Movements: Extraocular movements intact.     Conjunctiva/sclera: Conjunctivae normal.     Pupils: Pupils are equal, round, and reactive to light.  Cardiovascular:     Rate and Rhythm: Normal rate and regular rhythm.     Pulses: Normal pulses.     Heart sounds: Normal heart sounds.  Pulmonary:     Effort: Pulmonary effort is normal.     Breath sounds: Normal breath sounds.  Abdominal:     General: Abdomen is flat. Bowel sounds are normal.     Palpations: Abdomen is soft.  Musculoskeletal:  General: Normal range of motion.     Comments: Left BKA  Skin:    General: Skin is warm.     Capillary Refill: Capillary refill takes Jordan than 2 seconds.  Neurological:     General: No focal deficit present.     Mental Status: He is alert and oriented to person, place, and time. Mental status is at baseline.  Psychiatric:        Mood and Affect: Mood normal.        Behavior: Behavior normal.        Thought Content: Thought content normal.        Judgment: Judgment normal.     BP 132/84   Pulse 86   Ht '5\' 6"'$  (1.676 m)   Wt 158 lb (71.7 kg)   BMI 25.50 kg/m  Wt Readings from Last 3 Encounters:  05/17/22 158 lb (71.7 kg)  02/09/22 155 lb 3.2 oz (70.4 kg)  12/07/21 153 lb 1.6 oz (69.4 kg)     Health Maintenance Due  Topic Date Due   COVID-19 Vaccine (1) Never done   Zoster Vaccines- Shingrix (1 of 2) Never done   Pneumonia Vaccine 23+ Years old (2 - PCV) 08/18/2018   INFLUENZA VACCINE  05/15/2022    There are no preventive care reminders to display for this patient.  Lab Results  Component Value Date   TSH 0.72 11/21/2021   Lab Results  Component Value Date   WBC 11.0 (H) 11/21/2021   HGB 13.1 (L) 11/21/2021   HCT 39.9 11/21/2021   MCV 87.3 11/21/2021   PLT 227 11/21/2021   Lab Results  Component Value Date   NA 136 11/21/2021   K 4.6 11/21/2021    CO2 18 (L) 11/21/2021   GLUCOSE 87 11/21/2021   BUN 23 11/21/2021   CREATININE 1.75 (H) 11/21/2021   BILITOT 0.3 08/26/2017   ALKPHOS 76 08/26/2017   AST 42 (H) 08/26/2017   ALT 33 08/26/2017   PROT 7.6 08/26/2017   ALBUMIN 2.5 (L) 08/26/2017   CALCIUM 9.6 11/21/2021   ANIONGAP 10 09/17/2019   Lab Results  Component Value Date   CHOL 122 11/21/2021   Lab Results  Component Value Date   HDL 12 (L) 11/21/2021   Lab Results  Component Value Date   LDLCALC 83 11/21/2021   Lab Results  Component Value Date   TRIG 172 (H) 11/21/2021   Lab Results  Component Value Date   CHOLHDL 10.2 (H) 11/21/2021   No results found for: "HGBA1C"    Assessment & Plan:   Problem List Items Addressed This Visit       Cardiovascular and Mediastinum   Hypertension - Primary    Blood pressure 132/84 in the office today. Consume heart healthy and low-salt diet. Continue amlodipine, metoprolol, lisinopril HCTZ. We will continue to monitor.      Relevant Medications   amLODipine (NORVASC) 5 MG tablet   lisinopril-hydrochlorothiazide (ZESTORETIC) 20-12.5 MG tablet   atorvastatin (LIPITOR) 10 MG tablet   Peripheral vascular disease ([facility])    Continue atorvastatin 10 mg. Patient states he is not taking Plavix. Referral sent for vascular surgeon.      Relevant Medications   amLODipine (NORVASC) 5 MG tablet   lisinopril-hydrochlorothiazide (ZESTORETIC) 20-12.5 MG tablet   atorvastatin (LIPITOR) 10 MG tablet   Other Relevant Orders   Ambulatory referral to Vascular Surgery     Other   HLD (hyperlipidemia)    Continue atorvastatin 10 mg daily. Advised patient  to consume balanced diet and follow regular exercise routine.      Relevant Medications   amLODipine (NORVASC) 5 MG tablet   lisinopril-hydrochlorothiazide (ZESTORETIC) 20-12.5 MG tablet   atorvastatin (LIPITOR) 10 MG tablet   Meds ordered this encounter  Medications   amLODipine (NORVASC) 5 MG tablet    Sig: Take 1  tablet (5 mg total) by mouth 2 (two) times daily.    Dispense:  180 tablet    Refill:  2   lisinopril-hydrochlorothiazide (ZESTORETIC) 20-12.5 MG tablet    Sig: Take 2 tablets by mouth daily.    Dispense:  180 tablet    Refill:  2   atorvastatin (LIPITOR) 10 MG tablet    Sig: Take 1 tablet (10 mg total) by mouth daily.    Dispense:  90 tablet    Refill:  3     Follow-up: Return in about 3 months (around 08/17/2022), or if symptoms worsen or fail to improve.    Theresia Lo, NP

## 2022-05-17 NOTE — Assessment & Plan Note (Signed)
Continue atorvastatin 10 mg daily. Advised patient to consume balanced diet and follow regular exercise routine.

## 2022-05-17 NOTE — Assessment & Plan Note (Signed)
Continue atorvastatin 10 mg. Patient states he is not taking Plavix. Referral sent for vascular surgeon.

## 2022-06-03 ENCOUNTER — Other Ambulatory Visit: Payer: Self-pay | Admitting: Internal Medicine

## 2022-06-03 DIAGNOSIS — N182 Chronic kidney disease, stage 2 (mild): Secondary | ICD-10-CM

## 2022-07-26 ENCOUNTER — Other Ambulatory Visit (INDEPENDENT_AMBULATORY_CARE_PROVIDER_SITE_OTHER): Payer: Self-pay | Admitting: Vascular Surgery

## 2022-07-26 DIAGNOSIS — I739 Peripheral vascular disease, unspecified: Secondary | ICD-10-CM

## 2022-07-31 ENCOUNTER — Ambulatory Visit (INDEPENDENT_AMBULATORY_CARE_PROVIDER_SITE_OTHER): Payer: Medicare Other | Admitting: Vascular Surgery

## 2022-07-31 ENCOUNTER — Ambulatory Visit (INDEPENDENT_AMBULATORY_CARE_PROVIDER_SITE_OTHER): Payer: Medicare Other

## 2022-07-31 DIAGNOSIS — I739 Peripheral vascular disease, unspecified: Secondary | ICD-10-CM | POA: Diagnosis not present

## 2022-08-10 ENCOUNTER — Encounter (INDEPENDENT_AMBULATORY_CARE_PROVIDER_SITE_OTHER): Payer: Self-pay | Admitting: Vascular Surgery

## 2022-08-10 ENCOUNTER — Ambulatory Visit (INDEPENDENT_AMBULATORY_CARE_PROVIDER_SITE_OTHER): Payer: Medicare Other | Admitting: Vascular Surgery

## 2022-08-10 VITALS — BP 161/76 | HR 63 | Resp 18 | Ht 67.0 in | Wt 158.0 lb

## 2022-08-10 DIAGNOSIS — I10 Essential (primary) hypertension: Secondary | ICD-10-CM | POA: Diagnosis not present

## 2022-08-10 DIAGNOSIS — E785 Hyperlipidemia, unspecified: Secondary | ICD-10-CM

## 2022-08-10 DIAGNOSIS — I70212 Atherosclerosis of native arteries of extremities with intermittent claudication, left leg: Secondary | ICD-10-CM | POA: Diagnosis not present

## 2022-08-10 DIAGNOSIS — I739 Peripheral vascular disease, unspecified: Secondary | ICD-10-CM | POA: Diagnosis not present

## 2022-08-10 DIAGNOSIS — Z89511 Acquired absence of right leg below knee: Secondary | ICD-10-CM

## 2022-08-10 DIAGNOSIS — I70219 Atherosclerosis of native arteries of extremities with intermittent claudication, unspecified extremity: Secondary | ICD-10-CM | POA: Insufficient documentation

## 2022-08-10 NOTE — Progress Notes (Signed)
Patient ID: Jordan Mills, male   DOB: 02/19/1951, 71 y.o.   MRN: [id number]  No chief complaint on file.   HPI Jordan Mills is a 71 y.o. male.  I am asked to see the patient by Dr. Lavera Guise for evaluation of PAD.  The patient is well-known to me but had not been seen in some time.  He actually underwent a right below-knee amputation about 5 years ago and has healed well from this.  He walks with a prosthesis.  He has claudication symptoms in the left leg which are moderate in severity and have been stable over the past couple of years.  He also gets what sounds like a gout flare in his left great toe about 2 or 3 times a year.  He does not have any open wounds or infection.  No fevers or chills.  No problems with his right below-knee amputation.  Recent left ABI was 0.82 at rest with monophasic waveforms.   Past Medical History:  Diagnosis Date   Gout    HLD (hyperlipidemia)    Hypertension    Peripheral vascular disease ([place])     Past Surgical History:  Procedure Laterality Date   AMPUTATION Right 08/28/2017   Procedure: AMPUTATION BELOW KNEE;  Surgeon: Algernon Huxley, MD;  Location: [facility];  Service: Vascular;  Laterality: Right;   AMPUTATION TOE Right 07/19/2017   Procedure: AMPUTATION TOE/Rt 4th MPJ;  Surgeon: Sharlotte Alamo, DPM;  Location: [facility];  Service: Podiatry;  Laterality: Right;   LOWER EXTREMITY ANGIOGRAPHY Right 07/01/2017   Procedure: Lower Extremity Angiography;  Surgeon: Algernon Huxley, MD;  Location: [place] [facility];  Service: Cardiovascular;  Laterality: Right;   LOWER EXTREMITY ANGIOGRAPHY Right 08/15/2017   Procedure: Lower Extremity Angiography;  Surgeon: Algernon Huxley, MD;  Location: [place] [facility];  Service: Cardiovascular;  Laterality: Right;   LOWER EXTREMITY ANGIOGRAPHY Right 08/16/2017   Procedure: LOWER EXTREMITY ANGIOGRAPHY;  Surgeon: Algernon Huxley, MD;  Location: [place] [facility];  Service: Cardiovascular;  Laterality: Right;   LOWER  EXTREMITY INTERVENTION  08/15/2017   Procedure: LOWER EXTREMITY INTERVENTION;  Surgeon: Algernon Huxley, MD;  Location: [place] [facility];  Service: Cardiovascular;;   TONSILLECTOMY       Family History  Problem Relation Age of Onset   Hypertension Mother    Hyperlipidemia Mother    Diabetes Mother    Stroke Mother    Lung cancer Sister       Social History   Tobacco Use   Smoking status: Former    Packs/day: 0.20    Types: Cigarettes    Quit date: 06/13/2017    Years since quitting: 5.1   Smokeless tobacco: Never  Vaping Use   Vaping Use: Former  Substance Use Topics   Alcohol use: No   Drug use: No     No Known Allergies  Current Outpatient Medications  Medication Sig Dispense Refill   amLODipine (NORVASC) 5 MG tablet Take 1 tablet (5 mg total) by mouth 2 (two) times daily. 180 tablet 2   aspirin EC 81 MG tablet Take by mouth.     lisinopril-hydrochlorothiazide (ZESTORETIC) 20-12.5 MG tablet Take 2 tablets by mouth daily. 180 tablet 2   metoprolol tartrate (LOPRESSOR) 50 MG tablet TAKE 1 TABLET BY MOUTH TWICE A DAY 180 tablet 1   clopidogrel (PLAVIX) 75 MG tablet TAKE 1 TABLET BY MOUTH DAILY (Patient not taking: Reported on 08/10/2022) 90 tablet 3  No current facility-administered medications for this visit.      REVIEW OF SYSTEMS (Negative unless checked)  Constitutional: '[]'$ Weight loss  '[]'$ Fever  '[]'$ Chills Cardiac: '[]'$ Chest pain   '[]'$ Chest pressure   '[]'$ Palpitations   '[]'$ Shortness of breath when laying flat   '[]'$ Shortness of breath at rest   '[]'$ Shortness of breath with exertion. Vascular:  '[x]'$ Pain in legs with walking   '[]'$ Pain in legs at rest   '[]'$ Pain in legs when laying flat   '[x]'$ Claudication   '[]'$ Pain in feet when walking  '[]'$ Pain in feet at rest  '[]'$ Pain in feet when laying flat   '[]'$ History of DVT   '[]'$ Phlebitis   '[]'$ Swelling in legs   '[]'$ Varicose veins   '[]'$ Non-healing ulcers Pulmonary:   '[]'$ Uses home oxygen   '[]'$ Productive cough   '[]'$ Hemoptysis   '[]'$ Wheeze  '[]'$ COPD    '[]'$ Asthma Neurologic:  '[]'$ Dizziness  '[]'$ Blackouts   '[]'$ Seizures   '[]'$ History of stroke   '[]'$ History of TIA  '[]'$ Aphasia   '[]'$ Temporary blindness   '[]'$ Dysphagia   '[]'$ Weakness or numbness in arms   '[]'$ Weakness or numbness in legs Musculoskeletal:  '[x]'$ Arthritis   '[]'$ Joint swelling   '[]'$ Joint pain   '[]'$ Low back pain Hematologic:  '[]'$ Easy bruising  '[]'$ Easy bleeding   '[]'$ Hypercoagulable state   '[]'$ Anemic  '[]'$ Hepatitis Gastrointestinal:  '[]'$ Blood in stool   '[]'$ Vomiting blood  '[x]'$ Gastroesophageal reflux/heartburn   '[]'$ Abdominal pain Genitourinary:  '[]'$ Chronic kidney disease   '[]'$ Difficult urination  '[]'$ Frequent urination  '[]'$ Burning with urination   '[]'$ Hematuria Skin:  '[]'$ Rashes   '[]'$ Ulcers   '[]'$ Wounds Psychological:  '[]'$ History of anxiety   '[]'$  History of major depression.    Physical Exam BP (!) 161/76 (BP Location: Left Arm)   Pulse 63   Resp 18   Ht '5\' 7"'$  (1.702 m)   Wt 158 lb (71.7 kg)   BMI 24.75 kg/m  Gen:  WD/WN, NAD Head: /AT, No temporalis wasting.  Ear/Nose/Throat: Hearing grossly intact, nares w/o erythema or drainage, oropharynx w/o Erythema/Exudate Eyes: Conjunctiva clear, sclera non-icteric  Neck: trachea midline.  No JVD.  Pulmonary:  Good air movement, respirations not labored, no use of accessory muscles  Cardiac: RRR, no JVD Vascular:  Vessel Right Left  Radial Palpable Palpable                          PT Not palpable 1+  DP Not palpable Trace   Gastrointestinal:. No masses, surgical incisions, or scars. Musculoskeletal: M/S 5/5 throughout.  Extremities without ischemic changes.  Right below-knee amputation with prosthesis in place.  No edema Neurologic: Sensation grossly intact in extremities.  Symmetrical.  Speech is fluent. Motor exam as listed above. Psychiatric: Judgment intact, Mood & affect appropriate for pt's clinical situation. Dermatologic: No rashes or ulcers noted.  No cellulitis or open wounds.    Radiology VAS [place] ABI WITH/WO TBI  Result Date: 07/31/2022  LOWER EXTREMITY  DOPPLER STUDY Patient Name:  Jordan Mills  Date of Exam:   07/31/2022 Medical Rec #: [id number]          Accession #:    [id number] Date of Birth: 05-16-1951          Patient Gender: M Patient Age:   56 years Exam Location:  [facility] Procedure:      VAS [place] ABI WITH/WO TBI Referring Phys: --------------------------------------------------------------------------------  Indications: Peripheral artery disease, and Right BKA 08/28/2017; Multiple right              leg interventions  prior to [place]; Left PTA and Iliac stent 07/11/2017.              Right BKA High Risk Factors: Hypertension, non insulin dependent diabetes mellitus.  Comparison Study: 07/29/2020 Performing Technologist: Concha Norway RVT  Examination Guidelines: A complete evaluation includes at minimum, Doppler waveform signals and systolic blood pressure reading at the level of bilateral brachial, anterior tibial, and posterior tibial arteries, when vessel segments are accessible. Bilateral testing is considered an integral part of a complete examination. Photoelectric Plethysmograph (PPG) waveforms and toe systolic pressure readings are included as required and additional duplex testing as needed. Limited examinations for reoccurring indications may be performed as noted.  ABI Findings: +--------+------------------+-----+--------+--------+ Right   Rt Pressure (mmHg)IndexWaveformComment  +--------+------------------+-----+--------+--------+ [id number]                                     +--------+------------------+-----+--------+--------+ +---------+------------------+-----+----------+-------+ Left     Lt Pressure (mmHg)IndexWaveform  Comment +---------+------------------+-----+----------+-------+ ATA      68                0.54 monophasic        +---------+------------------+-----+----------+-------+ PTA                             absent            +---------+------------------+-----+----------+-------+  PERO     104               0.82 monophasic        +---------+------------------+-----+----------+-------+ Great Toe84                0.66 Abnormal          +---------+------------------+-----+----------+-------+ +-------+-----------+-----------+------------+------------+ ABI/TBIToday's ABIToday's TBIPrevious ABIPrevious TBI +-------+-----------+-----------+------------+------------+ Right  BKA                   BKA                      +-------+-----------+-----------+------------+------------+ Left   .82        .66        .63         .29          +-------+-----------+-----------+------------+------------+ Left ABIs and TBIs appear increased compared to prior study on 03/2020.  Summary: Right: BKA. Left: Resting left ankle-brachial index indicates mild left lower extremity arterial disease. The left toe-brachial index is abnormal. *See table(s) above for measurements and observations.  Electronically signed by [name] MD on 07/31/2022 at 4:14:46 PM.    Final     Labs No results found for this or any previous visit (from the past 2160 hour(s)).  Assessment/Plan: Hx of BKA, right ([place]) Remote. Healed.    HLD (hyperlipidemia) lipid control important in reducing the progression of atherosclerotic disease. Continue statin therapy    Hypertension blood pressure control important in reducing the progression of atherosclerotic disease. On appropriate oral medications.  Atherosclerosis of native arteries of extremity with intermittent claudication (HCC) Recent left ABI was 0.82 at rest with monophasic waveforms.  He does have claudication symptoms.  We had a long talk today regarding options.  We could certainly consider angiogram with possible intervention, but the situation he has now is a stable and nonlimb threatening situation.  Any intervention could certainly lead to deterioration of symptoms if he has recurrent disease.  I discussed  that this is a situation where  sometimes intervention would actually increase the risk of limb loss in the long-term and he voices understanding and does not want to do this.  He says his claudication symptoms are tolerable.  I will plan to see him back in 6 months with ABIs.  Continue current medical regimen.      [name] 08/10/2022, 10:13 AM   This note was created with Dragon medical transcription system.  Any errors from dictation are unintentional.

## 2022-08-10 NOTE — Assessment & Plan Note (Signed)
Recent left ABI was 0.82 at rest with monophasic waveforms.  He does have claudication symptoms.  We had a long talk today regarding options.  We could certainly consider angiogram with possible intervention, but the situation he has now is a stable and nonlimb threatening situation.  Any intervention could certainly lead to deterioration of symptoms if he has recurrent disease.  I discussed that this is a situation where sometimes intervention would actually increase the risk of limb loss in the long-term and he voices understanding and does not want to do this.  He says his claudication symptoms are tolerable.  I will plan to see him back in 6 months with ABIs.  Continue current medical regimen.

## 2022-09-20 ENCOUNTER — Ambulatory Visit (INDEPENDENT_AMBULATORY_CARE_PROVIDER_SITE_OTHER): Payer: Medicare Other | Admitting: Nurse Practitioner

## 2022-09-20 ENCOUNTER — Encounter: Payer: Self-pay | Admitting: Nurse Practitioner

## 2022-09-20 VITALS — BP 124/80 | HR 57 | Ht 67.0 in | Wt 160.1 lb

## 2022-09-20 DIAGNOSIS — F17200 Nicotine dependence, unspecified, uncomplicated: Secondary | ICD-10-CM | POA: Diagnosis not present

## 2022-09-20 DIAGNOSIS — Z23 Encounter for immunization: Secondary | ICD-10-CM

## 2022-09-20 DIAGNOSIS — I10 Essential (primary) hypertension: Secondary | ICD-10-CM

## 2022-09-20 DIAGNOSIS — I739 Peripheral vascular disease, unspecified: Secondary | ICD-10-CM

## 2022-09-20 NOTE — Progress Notes (Signed)
Established Patient Office Visit  Subjective:  Patient ID: Jordan Mills, male    DOB: 04-Dec-1950  Age: 71 y.o. MRN: [id number]  CC:  Chief Complaint  Patient presents with   Hypertension    4 month FU, no new problems     HPI  Jordan Mills presents for routine follow-up.  He has history of peripheral vascular disease, hypertension, hyperlipidemia and gout.  Patient states that he is overall doing fine no new complaints at present.  Patient states that he went to the vascular surgeon for follow-up appointment on 08/10/2022. HPI   Past Medical History:  Diagnosis Date   Gout    HLD (hyperlipidemia)    Hypertension    Peripheral vascular disease ([place])     Past Surgical History:  Procedure Laterality Date   AMPUTATION Right 08/28/2017   Procedure: AMPUTATION BELOW KNEE;  Surgeon: Algernon Huxley, MD;  Location: [facility];  Service: Vascular;  Laterality: Right;   AMPUTATION TOE Right 07/19/2017   Procedure: AMPUTATION TOE/Rt 4th MPJ;  Surgeon: Sharlotte Alamo, DPM;  Location: [facility];  Service: Podiatry;  Laterality: Right;   LOWER EXTREMITY ANGIOGRAPHY Right 07/01/2017   Procedure: Lower Extremity Angiography;  Surgeon: Algernon Huxley, MD;  Location: [place] [facility];  Service: Cardiovascular;  Laterality: Right;   LOWER EXTREMITY ANGIOGRAPHY Right 08/15/2017   Procedure: Lower Extremity Angiography;  Surgeon: Algernon Huxley, MD;  Location: [place] [facility];  Service: Cardiovascular;  Laterality: Right;   LOWER EXTREMITY ANGIOGRAPHY Right 08/16/2017   Procedure: LOWER EXTREMITY ANGIOGRAPHY;  Surgeon: Algernon Huxley, MD;  Location: [place] [facility];  Service: Cardiovascular;  Laterality: Right;   LOWER EXTREMITY INTERVENTION  08/15/2017   Procedure: LOWER EXTREMITY INTERVENTION;  Surgeon: Algernon Huxley, MD;  Location: [place] [facility];  Service: Cardiovascular;;   TONSILLECTOMY      Family History  Problem Relation Age of Onset   Hypertension Mother     Hyperlipidemia Mother    Diabetes Mother    Stroke Mother    Lung cancer Sister     Social History   Socioeconomic History   Marital status: Married    Spouse name: Not on file   Number of children: Not on file   Years of education: Not on file   Highest education level: Not on file  Occupational History   Not on file  Tobacco Use   Smoking status: Some Days    Packs/day: 0.20    Types: Cigarettes   Smokeless tobacco: Never  Vaping Use   Vaping Use: Former  Substance and Sexual Activity   Alcohol use: No   Drug use: No   Sexual activity: Not on file  Other Topics Concern   Not on file  Social History Narrative   Not on file   Social Determinants of Health   Financial Resource Strain: Low Risk  (03/29/2022)   Overall Financial Resource Strain (CARDIA)    Difficulty of Paying Living Expenses: Not very hard  Food Insecurity: No Food Insecurity (03/29/2022)   Hunger Vital Sign    Worried About Running Out of Food in the Last Year: Never true    [place] in the Last Year: Never true  Transportation Needs: No Transportation Needs (03/29/2022)   PRAPARE - Hydrologist (Medical): No    Lack of Transportation (Non-Medical): No  Physical Activity: Insufficiently Active (03/29/2022)   Exercise Vital Sign    Days  of Exercise per Week: 4 days    Minutes of Exercise per Session: 30 min  Stress: No Stress Concern Present (03/29/2022)   [place]    Feeling of Stress : Not at all  Social Connections: Moderately Isolated (03/29/2022)   Social Connection and Isolation Panel [NHANES]    Frequency of Communication with Friends and Family: More than three times a week    Frequency of Social Gatherings with Friends and Family: More than three times a week    Attends Religious Services: Never    Marine scientist or Organizations: No    Attends Archivist Meetings: Never     Marital Status: Married  Human resources officer Violence: Not At Risk (03/29/2022)   Humiliation, Afraid, Rape, and Kick questionnaire    Fear of Current or Ex-Partner: No    Emotionally Abused: No    Physically Abused: No    Sexually Abused: No     Outpatient Medications Prior to Visit  Medication Sig Dispense Refill   amLODipine (NORVASC) 5 MG tablet Take 1 tablet (5 mg total) by mouth 2 (two) times daily. 180 tablet 2   aspirin EC 81 MG tablet Take by mouth.     clopidogrel (PLAVIX) 75 MG tablet TAKE 1 TABLET BY MOUTH DAILY 90 tablet 3   lisinopril-hydrochlorothiazide (ZESTORETIC) 20-12.5 MG tablet Take 2 tablets by mouth daily. 180 tablet 2   metoprolol tartrate (LOPRESSOR) 50 MG tablet TAKE 1 TABLET BY MOUTH TWICE A DAY 180 tablet 1   No facility-administered medications prior to visit.    No Known Allergies  ROS Review of Systems  Constitutional: Negative.   HENT: Negative.    Eyes: Negative.   Respiratory:  Negative for chest tightness and shortness of breath.   Cardiovascular:  Negative for chest pain and leg swelling.  Gastrointestinal: Negative.   Genitourinary: Negative.   Musculoskeletal: Negative.   Neurological:  Negative for dizziness, facial asymmetry and headaches.  Psychiatric/Behavioral:  Negative for agitation and behavioral problems.       Objective:    Physical Exam Constitutional:      Appearance: Normal appearance. He is normal weight.  HENT:     Right Ear: Tympanic membrane normal.     Left Ear: Tympanic membrane normal.     Mouth/Throat:     Mouth: Mucous membranes are moist.     Pharynx: Oropharynx is clear.  Eyes:     Pupils: Pupils are equal, round, and reactive to light.  Cardiovascular:     Rate and Rhythm: Normal rate and regular rhythm.     Pulses: Normal pulses.     Heart sounds: Normal heart sounds. No murmur heard. Pulmonary:     Effort: Pulmonary effort is normal.     Breath sounds: Normal breath sounds.  Abdominal:      General: Abdomen is flat. Bowel sounds are normal. There is no distension.     Palpations: Abdomen is soft.     Hernia: No hernia is present.  Musculoskeletal:     Right Lower Extremity: Right leg is amputated below knee.  Skin:    General: Skin is warm.  Neurological:     General: No focal deficit present.     Mental Status: He is alert and oriented to person, place, and time. Mental status is at baseline.  Psychiatric:        Mood and Affect: Mood normal.        Behavior: Behavior  normal.        Thought Content: Thought content normal.        Judgment: Judgment normal.     BP 124/80   Pulse (!) 57   Ht '5\' 7"'$  (1.702 m)   Wt 160 lb 1.6 oz (72.6 kg)   SpO2 98%   BMI 25.08 kg/m  Wt Readings from Last 3 Encounters:  09/20/22 160 lb 1.6 oz (72.6 kg)  08/10/22 158 lb (71.7 kg)  05/17/22 158 lb (71.7 kg)     Health Maintenance  Topic Date Due   COVID-19 Vaccine (1) 10/06/2022 (Originally 07/06/1951)   COLONOSCOPY (Pts 45-27yr Insurance coverage will need to be confirmed)  11/21/2022 (Originally 01/03/1994)   Zoster Vaccines- Shingrix (1 of 2) 12/20/2022 (Originally 01/04/1999)   Pneumonia Vaccine 73 Years old (2 - PCV) 09/21/2023 (Originally 08/18/2018)   Medicare Annual Wellness (AWV)  03/30/2023   INFLUENZA VACCINE  Completed   Hepatitis C Screening  Completed   HPV VACCINES  Aged Out   DTaP/Tdap/Td  Discontinued    There are no preventive care reminders to display for this patient.  Lab Results  Component Value Date   TSH 0.72 11/21/2021   Lab Results  Component Value Date   WBC 11.0 (H) 11/21/2021   HGB 13.1 (L) 11/21/2021   HCT 39.9 11/21/2021   MCV 87.3 11/21/2021   PLT 227 11/21/2021   Lab Results  Component Value Date   NA 136 11/21/2021   K 4.6 11/21/2021   CO2 18 (L) 11/21/2021   GLUCOSE 87 11/21/2021   BUN 23 11/21/2021   CREATININE 1.75 (H) 11/21/2021   BILITOT 0.3 08/26/2017   ALKPHOS 76 08/26/2017   AST 42 (H) 08/26/2017   ALT 33 08/26/2017    PROT 7.6 08/26/2017   ALBUMIN 2.5 (L) 08/26/2017   CALCIUM 9.6 11/21/2021   ANIONGAP 10 09/17/2019   Lab Results  Component Value Date   CHOL 122 11/21/2021   Lab Results  Component Value Date   HDL 12 (L) 11/21/2021   Lab Results  Component Value Date   LDLCALC 83 11/21/2021   Lab Results  Component Value Date   TRIG 172 (H) 11/21/2021   Lab Results  Component Value Date   CHOLHDL 10.2 (H) 11/21/2021   No results found for: "HGBA1C"    Assessment & Plan:   Problem List Items Addressed This Visit       Cardiovascular and Mediastinum   Hypertension    Patient BP  Vitals:   09/20/22 1032  BP: 124/80    in the office today. Encouraged him to consume low-salt heart healthy diet. Continue regular physical activity schedule      Peripheral vascular disease ([facility]    Patient followed by the vascular surgeon. He is not taking any blood thinner at present.        Other   Tobacco use disorder    Smoking cessation was discussed, 5-7 minutes was spent of this topic specifically.        Other Visit Diagnoses     Need for immunization against influenza    -  Primary   Relevant Orders   Flu Vaccine QUAD 663moM (Fluarix, Fluzone & Alfiuria Quad PF) (Completed)        No orders of the defined types were placed in this encounter.    Follow-up: No follow-ups on file.    ChTheresia LoNP

## 2022-09-24 ENCOUNTER — Encounter: Payer: Self-pay | Admitting: Nurse Practitioner

## 2022-09-24 NOTE — Assessment & Plan Note (Signed)
Patient followed by the vascular surgeon. He is not taking any blood thinner at present.

## 2022-09-24 NOTE — Assessment & Plan Note (Signed)
Smoking cessation was discussed, 5-7 minutes was spent of this topic specifically.   

## 2022-09-24 NOTE — Assessment & Plan Note (Signed)
Patient BP  Vitals:   09/20/22 1032  BP: 124/80    in the office today. Encouraged him to consume low-salt heart healthy diet. Continue regular physical activity schedule

## 2023-01-22 ENCOUNTER — Ambulatory Visit: Payer: Medicare Other | Admitting: Internal Medicine

## 2023-02-04 DIAGNOSIS — I998 Other disorder of circulatory system: Secondary | ICD-10-CM | POA: Diagnosis not present

## 2023-02-04 DIAGNOSIS — I7090 Unspecified atherosclerosis: Secondary | ICD-10-CM | POA: Diagnosis not present

## 2023-02-04 DIAGNOSIS — E785 Hyperlipidemia, unspecified: Secondary | ICD-10-CM | POA: Diagnosis not present

## 2023-02-04 DIAGNOSIS — I10 Essential (primary) hypertension: Secondary | ICD-10-CM | POA: Diagnosis not present

## 2023-02-04 DIAGNOSIS — I739 Peripheral vascular disease, unspecified: Secondary | ICD-10-CM | POA: Diagnosis not present

## 2023-02-07 ENCOUNTER — Other Ambulatory Visit: Payer: Self-pay

## 2023-02-07 ENCOUNTER — Emergency Department
Admission: EM | Admit: 2023-02-07 | Discharge: 2023-02-07 | Disposition: A | Discharge status: Home / Self Care | Payer: Medicare Other | Attending: Emergency Medicine | Admitting: Emergency Medicine

## 2023-02-07 ENCOUNTER — Emergency Department: Payer: Medicare Other

## 2023-02-07 DIAGNOSIS — D72829 Elevated white blood cell count, unspecified: Secondary | ICD-10-CM | POA: Insufficient documentation

## 2023-02-07 DIAGNOSIS — R55 Syncope and collapse: Secondary | ICD-10-CM

## 2023-02-07 DIAGNOSIS — I10 Essential (primary) hypertension: Secondary | ICD-10-CM | POA: Diagnosis not present

## 2023-02-07 DIAGNOSIS — Z7982 Long term (current) use of aspirin: Secondary | ICD-10-CM | POA: Insufficient documentation

## 2023-02-07 DIAGNOSIS — R001 Bradycardia, unspecified: Secondary | ICD-10-CM | POA: Diagnosis not present

## 2023-02-07 DIAGNOSIS — I959 Hypotension, unspecified: Secondary | ICD-10-CM | POA: Diagnosis not present

## 2023-02-07 LAB — URINALYSIS, ROUTINE W REFLEX MICROSCOPIC
Bilirubin Urine: NEGATIVE
Glucose, UA: NEGATIVE mg/dL
Hgb urine dipstick: NEGATIVE
Ketones, ur: NEGATIVE mg/dL
Leukocytes,Ua: NEGATIVE
Nitrite: NEGATIVE
Protein, ur: NEGATIVE mg/dL
Specific Gravity, Urine: 1.012 (ref 1.005–1.030)
pH: 5 (ref 5.0–8.0)

## 2023-02-07 LAB — CBC
HCT: 41.5 % (ref 39.0–52.0)
Hemoglobin: 13 g/dL (ref 13.0–17.0)
MCH: 28.9 pg (ref 26.0–34.0)
MCHC: 31.3 g/dL (ref 30.0–36.0)
MCV: 92.2 fL (ref 80.0–100.0)
Platelets: 191 10*3/uL (ref 150–400)
RBC: 4.5 MIL/uL (ref 4.22–5.81)
RDW: 15 % (ref 11.5–15.5)
WBC: 16.8 10*3/uL — ABNORMAL HIGH (ref 4.0–10.5)
nRBC: 0 % (ref 0.0–0.2)

## 2023-02-07 LAB — BASIC METABOLIC PANEL
Anion gap: 8 (ref 5–15)
BUN: 16 mg/dL (ref 8–23)
CO2: 20 mmol/L — ABNORMAL LOW (ref 22–32)
Calcium: 8.5 mg/dL — ABNORMAL LOW (ref 8.9–10.3)
Chloride: 107 mmol/L (ref 98–111)
Creatinine, Ser: 1.72 mg/dL — ABNORMAL HIGH (ref 0.61–1.24)
GFR, Estimated: 41 mL/min — ABNORMAL LOW (ref >60)
Glucose, Bld: 105 mg/dL — ABNORMAL HIGH (ref 70–99)
Potassium: 4.7 mmol/L (ref 3.5–5.1)
Sodium: 135 mmol/L (ref 135–145)

## 2023-02-07 MED ORDER — LACTATED RINGERS IV BOLUS
1000.0000 mL | Freq: Once | INTRAVENOUS | Status: AC
  Administered 2023-02-07: 1000 mL via INTRAVENOUS

## 2023-02-07 NOTE — ED Provider Notes (Signed)
[facility] Provider Note    Event Date/Time   First MD Initiated Contact with Patient 02/07/23 1314     (approximate)   History   Near Syncope   HPI  Jordan Mills is a 72 y.o. male with past medical history of hypertension and peripheral vascular disease who presents after a presyncopal episode.  Patient was sitting down in a chair outside talking to family when he suddenly noticed spots in his vision and then felt very sweaty and like he was going to pass out.  He sat down and put his head between his knees.  Did not actually lose consciousness.  He was helped up by family.  Denies a history of similar denies any preceding chest pain shortness of breath or palpitations.  He is currently feeling improved.  Says he does not always eat well denies nausea vomiting abdominal pain fevers or chills.  Has had diarrhea about 1 episode per day for the last several days.  Denies urinary symptoms cough congestion or fever.  Takes lisinopril and HCTZ as well as metoprolol for blood pressure.  Saw his primary doctor recently and blood pressure was well-controlled no recent changes to medications.  Patient does endorse intermittent floaters in bilateral visual fields that come and go.  Not brought on by standing.  He has eye doctor appointment later this month.  Denies any abnormal    Past Medical History:  Diagnosis Date   Gout    HLD (hyperlipidemia)    Hypertension    Peripheral vascular disease     Patient Active Problem List   Diagnosis Date Noted   Atherosclerosis of native arteries of extremity with intermittent claudication 08/10/2022   Need for hepatitis C screening test 11/21/2021   Peripheral vascular disease 10/22/2017   Hx of BKA, right 09/30/2017   Gangrene from atherosclerosis, extremities 08/25/2017   Ischemic leg 08/15/2017   HLD (hyperlipidemia) 07/16/2017   Hypertension 06/28/2017   Tobacco use disorder 06/28/2017   Atherosclerosis of  native arteries of the extremities with ulceration 06/28/2017     Physical Exam  Triage Vital Signs: ED Triage Vitals  Enc Vitals Group     BP 02/07/23 1311 (!) 88/50     Pulse Rate 02/07/23 1311 (!) 53     Resp 02/07/23 1311 16     Temp 02/07/23 1311 98.4 F (36.9 C)     Temp Source 02/07/23 1311 Oral     SpO2 02/07/23 1311 98 %     Weight --      Height --      Head Circumference --      Peak Flow --      Pain Score 02/07/23 1304 3     Pain Loc --      Pain Edu? --      Excl. in GC? --     Most recent vital signs: Vitals:   02/07/23 1557 02/07/23 1601  BP: (!) 154/77 126/69  Pulse:  (!) 54  Resp:  16  Temp:  98.2 F (36.8 C)  SpO2:  99%     General: Awake, no distress. Moist MM CV:  Good peripheral perfusion.  Resp:  Normal effort.  Abd:  No distention.  Neuro:             Awake, Alert, Oriented x 3  Other:  Aox3, nml speech  PERRL, EOMI, face symmetric, nml tongue movement  5/5 strength in the BL upper and lower extremities  Sensation  grossly intact in the BL upper and lower extremities  Finger-nose-finger intact BL Nml gait   ED Results / Procedures / Treatments  Labs (all labs ordered are listed, but only abnormal results are displayed) Labs Reviewed  BASIC METABOLIC PANEL - Abnormal; Notable for the following components:      Result Value   CO2 20 (*)    Glucose, Bld 105 (*)    Creatinine, Ser 1.72 (*)    Calcium 8.5 (*)    GFR, Estimated 41 (*)    All other components within normal limits  CBC - Abnormal; Notable for the following components:   WBC 16.8 (*)    All other components within normal limits  URINALYSIS, ROUTINE W REFLEX MICROSCOPIC - Abnormal; Notable for the following components:   Color, Urine YELLOW (*)    APPearance CLEAR (*)    All other components within normal limits     EKG  Due to interpreted by myself shows sinus bradycardia with right axis deviation T wave inversion lead III subtle ST depression in aVF T wave  inversion V6 with biphasic T wave in V5   RADIOLOGY I reviewed and interpreted the CT scan of the brain which does not show any acute intracranial process    PROCEDURES:  Critical Care performed: No  .1-3 Lead EKG Interpretation  Performed by: Georga Hacking, MD Authorized by: Georga Hacking, MD     Interpretation: abnormal     ECG rate assessment: bradycardic     Rhythm: sinus bradycardia     Ectopy: none     Conduction: normal     The patient is on the cardiac monitor to evaluate for evidence of arrhythmia and/or significant heart rate changes.   MEDICATIONS ORDERED IN ED: Medications  lactated ringers bolus 1,000 mL (0 mLs Intravenous Stopped 02/07/23 1601)     IMPRESSION / MDM / ASSESSMENT AND PLAN / ED COURSE  I reviewed the triage vital signs and the nursing notes.                              Patient's presentation is most consistent with acute presentation with potential threat to life or bodily function.  Differential diagnosis includes, but is not limited to, vasovagal episode, anemia, symptomatic bradycardia, valvular heart disease, cardiac arrhythmia  The patient is a 72 year old male who presents because of a near syncopal episode.  Patient was sitting outside when he had some spots in bilateral visual fields then felt warm and like he was going to pass out.  He felt better after putting his head between his knees.  Did not actually fully syncopized.  Denies any preceding palpitations chest pain or dyspnea.  Feels back to baseline.  He was hypotensive with EMS and received 500 cc bolus.  Initial BP on arrival is also hypotensive in the 80s systolic.  Prior to intervention patient's blood pressure did improve to 120s.  He denies symptoms currently does say he does not always eat and drink well but is denying any vomiting abdominal pain chest pain dyspnea fevers chills cough or urinary symptoms.  Patient's heart rate is in the 50s.  Looked back at prior  visits and he typically does run in the low to mid 50s.  EKG showing sinus bradycardia with normal intervals.  Labs are notable for leukocytosis to 16 which I think is nonspecific and not necessarily suggestive of infection given patient has no symptoms.  Plan  to check a urinalysis.  CT head was ordered from triage.  Does show possible subacute to chronic infarcts in the right parietal lobe.  Patient's neurologic exam is nonfocal.  He has been having some floaters in bilateral eyes but do not think this is representative stroke and he is not having any visual complaint currently.  I discussed these results with him recommend he continue his baby aspirin and will give neurology follow-up.  I did ambulate the patient he had no symptoms of orthostasis upon standing is able to ambulate without ataxia and is feeling improved and back to baseline.  Given his stabilizing vital signs lack of symptoms and active this episode sounds to be most likely vasovagal given the prodrome I do feel that patient can safely be discharged.  We discussed return precautions for any chest pain recurrent episodes of syncope or other neurologic symptoms.       FINAL CLINICAL IMPRESSION(S) / ED DIAGNOSES   Final diagnoses:  Near syncope     Rx / DC Orders   ED Discharge Orders     None        Note:  This document was prepared using Dragon voice recognition software and may include unintentional dictation errors.   Georga Hacking, MD 02/07/23 [phone]

## 2023-02-07 NOTE — ED Notes (Signed)
Pt discharge to home. Pt VSS, GCS 15, NAD. Pt verbalized understanding of discharge instructions with no additional questions at this time.  

## 2023-02-07 NOTE — Discharge Instructions (Addendum)
Your blood work did show that your white blood cell count was somewhat elevated but the rest of your blood work was reassuring.  Your CAT scan shows likely old stroke in the right side of your brain which I do not think is causing your symptoms today.  Please follow-up with neurology regarding this.  Please continue to take a baby aspirin.  Please monitor your blood pressure at home.  If it is consistently less than 120/80 or your heart rate is less than 50 then you may need to have your blood pressure medications adjusted.  Please follow-up with your primary care doctor.  Your blood pressure was initially low when you got here.  Your heart rate was also on the lower side.

## 2023-02-07 NOTE — ED Triage Notes (Addendum)
Pt to ED via [facility] from brother's house. Pt family reports near-syncopal episode. Pt reports his vision went black and was seeing stars.  20g LH. 500cc NS given in route. Pt reports has an upcoming appointment with eye doctor for seeing spots.   EMS VS:  CBG 123 HR 53 SB 102/56 --> 74/48 when standing

## 2023-02-08 ENCOUNTER — Ambulatory Visit (INDEPENDENT_AMBULATORY_CARE_PROVIDER_SITE_OTHER): Payer: Medicare Other

## 2023-02-08 ENCOUNTER — Encounter (INDEPENDENT_AMBULATORY_CARE_PROVIDER_SITE_OTHER): Payer: Self-pay | Admitting: Nurse Practitioner

## 2023-02-08 ENCOUNTER — Ambulatory Visit (INDEPENDENT_AMBULATORY_CARE_PROVIDER_SITE_OTHER): Payer: Medicare Other | Admitting: Nurse Practitioner

## 2023-02-08 VITALS — BP 108/66 | HR 49 | Resp 18 | Ht 66.0 in | Wt 152.0 lb

## 2023-02-08 DIAGNOSIS — E785 Hyperlipidemia, unspecified: Secondary | ICD-10-CM

## 2023-02-08 DIAGNOSIS — I70212 Atherosclerosis of native arteries of extremities with intermittent claudication, left leg: Secondary | ICD-10-CM

## 2023-02-08 DIAGNOSIS — Z89511 Acquired absence of right leg below knee: Secondary | ICD-10-CM | POA: Diagnosis not present

## 2023-02-08 DIAGNOSIS — F17200 Nicotine dependence, unspecified, uncomplicated: Secondary | ICD-10-CM | POA: Diagnosis not present

## 2023-02-11 LAB — VAS US ABI WITH/WO TBI: Left ABI: 0.47

## 2023-02-15 ENCOUNTER — Emergency Department
Admission: EM | Admit: 2023-02-15 | Discharge: 2023-02-15 | Disposition: A | Discharge status: Home / Self Care | Payer: Medicare Other | Attending: Emergency Medicine | Admitting: Emergency Medicine

## 2023-02-15 ENCOUNTER — Other Ambulatory Visit: Payer: Self-pay

## 2023-02-15 DIAGNOSIS — H538 Other visual disturbances: Secondary | ICD-10-CM | POA: Insufficient documentation

## 2023-02-15 MED ORDER — TETRACAINE HCL 0.5 % OP SOLN
1.0000 [drp] | Freq: Once | OPHTHALMIC | Status: AC
  Administered 2023-02-15: 1 [drp] via OPHTHALMIC
  Filled 2023-02-15: qty 4

## 2023-02-15 NOTE — ED Provider Notes (Signed)
[facility] Provider Note    Event Date/Time   First MD Initiated Contact with Patient 02/15/23 1107     (approximate)   History   Blurred Vision   HPI  Jordan Mills is a 72 y.o. male presents to the emergency department with blurry vision.  Patient endorses 1 week of ongoing blurry vision.  Feels like there is blots in both of his eyes.  Some improvement when he closes either eye but continues to have spots with both eyes open.  Denies any double vision.  Denies any nausea or vomiting.  No significant headache.  Does not wear glasses.  States that he went to the eye doctor on Monday and he was having symptoms at that time.  Told him that he needed glasses and had finding of vitreal degeneration and cataracts.  He states that his vision has worsened since that time.  Denies any chest pain or shortness of breath.  No extremity numbness or weakness.  No history of diabetes.     Physical Exam   Triage Vital Signs: ED Triage Vitals  Enc Vitals Group     BP 02/15/23 1023 (!) 145/69     Pulse Rate 02/15/23 1023 (!) 50     Resp 02/15/23 1023 16     Temp 02/15/23 1023 97.6 F (36.4 C)     Temp Source 02/15/23 1023 Oral     SpO2 02/15/23 1023 99 %     Weight 02/15/23 1024 155 lb (70.3 kg)     Height 02/15/23 1024 5\' 6"  (1.676 m)     Head Circumference --      Peak Flow --      Pain Score --      Pain Loc --      Pain Edu? --      Excl. in GC? --     Most recent vital signs: Vitals:   02/15/23 1023  BP: (!) 145/69  Pulse: (!) 50  Resp: 16  Temp: 97.6 F (36.4 C)  SpO2: 99%    Physical Exam Constitutional:      Appearance: He is well-developed.  HENT:     Head: Atraumatic.  Eyes:     General: No visual field deficit or scleral icterus.    Intraocular pressure: Right eye pressure is 11 mmHg. Left eye pressure is 10 mmHg. Measurements were taken using an automated tonometer.    Extraocular Movements: Extraocular movements intact.      Conjunctiva/sclera: Conjunctivae normal.     Right eye: Right conjunctiva is not injected. No exudate or hemorrhage.    Left eye: Left conjunctiva is not injected. No exudate or hemorrhage.    Pupils: Pupils are equal, round, and reactive to light.     Slit lamp exam:    Right eye: No photophobia.     Left eye: No photophobia.  Cardiovascular:     Rate and Rhythm: Regular rhythm.  Pulmonary:     Effort: No respiratory distress.  Musculoskeletal:     Cervical back: Normal range of motion.  Skin:    General: Skin is warm.  Neurological:     Mental Status: He is alert. Mental status is at baseline.     GCS: GCS eye subscore is 4. GCS verbal subscore is 5. GCS motor subscore is 6.     Cranial Nerves: Cranial nerves 2-12 are intact.     Motor: Motor function is intact.     Coordination: Coordination is intact.  Gait: Gait is intact.     IMPRESSION / MDM / ASSESSMENT AND PLAN / ED COURSE  I reviewed the triage vital signs and the nursing notes.  Differential diagnosis including retinal detachment, vitreous hemorrhage, cataracts, diabetic retinopathy low suspicion given no history of diabetes.  On chart review is within normal glucose and follows with primary care provider.    LABS (all labs ordered are listed, but only abnormal results are displayed) Labs interpreted as -    Labs Reviewed - No data to display   MDM  Intraocular pressures are within normal limits have a low suspicion for acute angle glaucoma.  Bedside ultrasound with no signs of retinal detachment.  No signs of a vitreous hemorrhage.  Patient without any direct or consensual photophobia have a low suspicion for an iritis.  Extraocular movements are intact.  Otherwise has a nonfocal neurologic exam and cranial nerves are intact.  Visual acuity 20/50 bilaterally and in each eye.  Discussed with Dr. Inez Pilgrim with ophthalmology, patient will have a follow-up appointment and can be scheduled on Monday.   Patient also has an eye doctor who he can follow-up with.  Given return precautions for any worsening symptoms.     PROCEDURES:  Critical Care performed: No  Procedures  Patient's presentation is most consistent with acute illness / injury with system symptoms.   MEDICATIONS ORDERED IN ED: Medications  tetracaine (PONTOCAINE) 0.5 % ophthalmic solution 1 drop (1 drop Right Eye Given by Other 02/15/23 1144)    FINAL CLINICAL IMPRESSION(S) / ED DIAGNOSES   Final diagnoses:  Blurry vision, bilateral     Rx / DC Orders   ED Discharge Orders     None        Note:  This document was prepared using Dragon voice recognition software and may include unintentional dictation errors.   Corena Herter, MD 02/15/23 1300

## 2023-02-15 NOTE — ED Triage Notes (Signed)
Pt c/o blurry vision x1 week and reports it not getting any better after seeing his eye doctor on Monday and having new glasses ordered. Pt denies any pain

## 2023-02-19 ENCOUNTER — Telehealth (INDEPENDENT_AMBULATORY_CARE_PROVIDER_SITE_OTHER): Payer: Self-pay

## 2023-02-19 ENCOUNTER — Encounter (INDEPENDENT_AMBULATORY_CARE_PROVIDER_SITE_OTHER): Payer: Self-pay | Admitting: Nurse Practitioner

## 2023-02-19 NOTE — Telephone Encounter (Signed)
I attempted to contact the patient to schedule him for a LLE angio with Dr. Wyn Quaker. A message was left for a return call.

## 2023-02-19 NOTE — Progress Notes (Signed)
Subjective:    Patient ID: Jordan Mills, male    DOB: 06/16/1951, 72 y.o.   MRN: [id number] Chief Complaint  Patient presents with   Follow-up    f/u in 6 months with abi -    The patient returns to the office for followup and review of the noninvasive studies.   The patient notes that there has been a significant deterioration in the lower extremity symptoms.  The patient notes interval shortening of their claudication distance and development of mild rest pain symptoms. No new ulcers or wounds have occurred since the last visit.  There have been no significant changes to the patient's overall health care.  The patient denies amaurosis fugax or recent TIA symptoms. There are no recent neurological changes noted. There is no history of DVT, PE or superficial thrombophlebitis. The patient denies recent episodes of angina or shortness of breath.   ABI's Rt=bka and Lt=0.47 (previous ABI's Rt=bka and Lt=0.82) Duplex US of the lower extremity arterial system shows monophasic waveforms and dampened toe waveforms in the left lower extremity    Review of Systems  Neurological:  Positive for weakness.  All other systems reviewed and are negative.      Objective:   Physical Exam Vitals reviewed.  HENT:     Head: Normocephalic.  Cardiovascular:     Rate and Rhythm: Normal rate.     Pulses:          Dorsalis pedis pulses are detected w/ Doppler on the left side.       Posterior tibial pulses are detected w/ Doppler on the left side.  Pulmonary:     Effort: Pulmonary effort is normal.  Musculoskeletal:     Right Lower Extremity: Right leg is amputated below knee.  Skin:    General: Skin is warm and dry.  Neurological:     Mental Status: He is alert and oriented to person, place, and time.  Psychiatric:        Mood and Affect: Mood normal.        Behavior: Behavior normal.        Thought Content: Thought content normal.        Judgment: Judgment normal.     BP 108/66  (BP Location: Left Arm)   Pulse (!) 49   Resp 18   Ht 5\' 6"  (1.676 m)   Wt 152 lb (68.9 kg)   BMI 24.53 kg/m   Past Medical History:  Diagnosis Date   Gout    HLD (hyperlipidemia)    Hypertension    Peripheral vascular disease (HCC)     Social History   Socioeconomic History   Marital status: Married    Spouse name: Not on file   Number of children: Not on file   Years of education: Not on file   Highest education level: Not on file  Occupational History   Not on file  Tobacco Use   Smoking status: Some Days    Packs/day: .2    Types: Cigarettes   Smokeless tobacco: Never  Vaping Use   Vaping Use: Former  Substance and Sexual Activity   Alcohol use: No   Drug use: No   Sexual activity: Not on file  Other Topics Concern   Not on file  Social History Narrative   Not on file   Social Determinants of Health   Financial Resource Strain: Low Risk  (03/29/2022)   Overall Financial Resource Strain (CARDIA)    Difficulty of Paying  Living Expenses: Not very hard  Food Insecurity: No Food Insecurity (03/29/2022)   Hunger Vital Sign    Worried About Running Out of Food in the Last Year: Never true    Ran Out of Food in the Last Year: Never true  Transportation Needs: No Transportation Needs (03/29/2022)   PRAPARE - Administrator, Civil Service (Medical): No    Lack of Transportation (Non-Medical): No  Physical Activity: Insufficiently Active (03/29/2022)   Exercise Vital Sign    Days of Exercise per Week: 4 days    Minutes of Exercise per Session: 30 min  Stress: No Stress Concern Present (03/29/2022)   [employer] of Occupational Health - Occupational Stress Questionnaire    Feeling of Stress : Not at all  Social Connections: Moderately Isolated (03/29/2022)   Social Connection and Isolation Panel [NHANES]    Frequency of Communication with Friends and Family: More than three times a week    Frequency of Social Gatherings with Friends and Family: More  than three times a week    Attends Religious Services: Never    Database administrator or Organizations: No    Attends Banker Meetings: Never    Marital Status: Married  Catering manager Violence: Not At Risk (03/29/2022)   Humiliation, Afraid, Rape, and Kick questionnaire    Fear of Current or Ex-Partner: No    Emotionally Abused: No    Physically Abused: No    Sexually Abused: No    Past Surgical History:  Procedure Laterality Date   AMPUTATION Right 08/28/2017   Procedure: AMPUTATION BELOW KNEE;  Surgeon: Annice Needy, MD;  Location: [facility];  Service: Vascular;  Laterality: Right;   AMPUTATION TOE Right 07/19/2017   Procedure: AMPUTATION TOE/Rt 4th MPJ;  Surgeon: Linus Galas, DPM;  Location: [facility];  Service: Podiatry;  Laterality: Right;   LOWER EXTREMITY ANGIOGRAPHY Right 07/01/2017   Procedure: Lower Extremity Angiography;  Surgeon: Annice Needy, MD;  Location: [facility];  Service: Cardiovascular;  Laterality: Right;   LOWER EXTREMITY ANGIOGRAPHY Right 08/15/2017   Procedure: Lower Extremity Angiography;  Surgeon: Annice Needy, MD;  Location: [facility];  Service: Cardiovascular;  Laterality: Right;   LOWER EXTREMITY ANGIOGRAPHY Right 08/16/2017   Procedure: LOWER EXTREMITY ANGIOGRAPHY;  Surgeon: Annice Needy, MD;  Location: [facility];  Service: Cardiovascular;  Laterality: Right;   LOWER EXTREMITY INTERVENTION  08/15/2017   Procedure: LOWER EXTREMITY INTERVENTION;  Surgeon: Annice Needy, MD;  Location: [facility];  Service: Cardiovascular;;   TONSILLECTOMY      Family History  Problem Relation Age of Onset   Hypertension Mother    Hyperlipidemia Mother    Diabetes Mother    Stroke Mother    Lung cancer Sister     No Known Allergies     Latest Ref Rng & Units 02/07/2023    1:11 PM 11/21/2021   12:04 PM 09/17/2019    3:43 PM  CBC  WBC 4.0 - 10.5 K/uL 16.8  11.0  11.7   Hemoglobin 13.0 - 17.0 g/dL 78.2  95.6  21.3    Hematocrit 39.0 - 52.0 % 41.5  39.9  45.0   Platelets 150 - 400 K/uL 191  227  277       CMP     Component Value Date/Time   NA 135 02/07/2023 1311   K 4.7 02/07/2023 1311   CL 107 02/07/2023 1311   CO2  20 (L) 02/07/2023 1311   GLUCOSE 105 (H) 02/07/2023 1311   BUN 16 02/07/2023 1311   CREATININE 1.72 (H) 02/07/2023 1311   CREATININE 1.75 (H) 11/21/2021 1204   CALCIUM 8.5 (L) 02/07/2023 1311   PROT 7.6 08/26/2017 0004   ALBUMIN 2.5 (L) 08/26/2017 0004   AST 42 (H) 08/26/2017 0004   ALT 33 08/26/2017 0004   ALKPHOS 76 08/26/2017 0004   BILITOT 0.3 08/26/2017 0004   GFRNONAA 41 (L) 02/07/2023 1311   GFRAA 52 (L) 09/17/2019 1543     VAS [place] ABI WITH/WO TBI  Result Date: 02/11/2023  LOWER EXTREMITY DOPPLER STUDY Patient Name:  DEQUAWN KOZIKOWSKI  Date of Exam:   02/08/2023 Medical Rec #: [id number]          Accession #:    [id number] Date of Birth: Mar 27, 1951          Patient Gender: M Patient Age:   39 years Exam Location:  [facility] Procedure:      VAS [place] ABI WITH/WO TBI Referring Phys: Festus Barren --------------------------------------------------------------------------------  Indications: Peripheral artery disease, and Right BKA 08/28/2017; Multiple right              leg interventions prior to BKA; Left PTA and Iliac stent 07/11/2017.              Right BKA High Risk Factors: Hypertension, non insulin dependent diabetes mellitus.  Comparison Study: 07/31/2022 Performing Technologist: Debbe Bales RVS  Examination Guidelines: A complete evaluation includes at minimum, Doppler waveform signals and systolic blood pressure reading at the level of bilateral brachial, anterior tibial, and posterior tibial arteries, when vessel segments are accessible. Bilateral testing is considered an integral part of a complete examination. Photoelectric Plethysmograph (PPG) waveforms and toe systolic pressure readings are included as required and additional duplex testing as needed. Limited  examinations for reoccurring indications may be performed as noted.  ABI Findings: +--------+------------------+-----+--------+--------+ Right   Rt Pressure (mmHg)IndexWaveformComment  +--------+------------------+-----+--------+--------+ [id number]                                     +--------+------------------+-----+--------+--------+ ATA                                    BKA      +--------+------------------+-----+--------+--------+ +---------+------------------+-----+----------+-------+ Left     Lt Pressure (mmHg)IndexWaveform  Comment +---------+------------------+-----+----------+-------+ Brachial 135                                      +---------+------------------+-----+----------+-------+ ATA      64                0.47 monophasic        +---------+------------------+-----+----------+-------+ PERO     60                0.44 monophasic        +---------+------------------+-----+----------+-------+ Great Toe60                0.44 Abnormal          +---------+------------------+-----+----------+-------+ +-------+-----------+-----------+------------+------------+ ABI/TBIToday's ABIToday's TBIPrevious ABIPrevious TBI +-------+-----------+-----------+------------+------------+ Right  BKA                   BKA                      +-------+-----------+-----------+------------+------------+  Left   .47        .44        .82         .66          +-------+-----------+-----------+------------+------------+  Left ABIs and TBIs appear decreased compared to prior study on 07/31/2022.  Summary: Right: Rt BKA. Left: Resting left ankle-brachial index indicates severe left lower extremity arterial disease. The left toe-brachial index is abnormal. *See table(s) above for measurements and observations.  Electronically signed by Festus Barren MD on 02/11/2023 at 8:31:17 AM.    Final        Assessment & Plan:   1. Atherosclerosis of native artery of left lower  extremity with intermittent claudication (HCC) Recommend:  The patient has experienced increased claudication symptoms and is now describing lifestyle limiting claudication and appears to be having mild rest pain symptroms.  Given the severity of the patient's severe left lower extremity symptoms the patient should undergo angiography with the hope for intervention.  Risk and benefits were reviewed the patient.  Indications for the procedure were reviewed.  All questions were answered, the patient agrees to proceed with left lower extremity angiography and possible intervention.   The patient should continue walking and begin a more formal exercise program.  The patient should continue antiplatelet therapy and aggressive treatment of the lipid abnormalities  The patient will follow up with me after the angiogram.   2. Hx of BKA, right (HCC) Currently the patient has an ill-fitting prosthetic.  Patient would benefit from refitting and updated prosthetic.  3. Tobacco use disorder Smoking cessation was discussed, 3-10 minutes spent on this topic specifically  4. Hyperlipidemia, unspecified hyperlipidemia type Continue statin as ordered and reviewed, no changes at this time   Current Outpatient Medications on File Prior to Visit  Medication Sig Dispense Refill   amLODipine (NORVASC) 5 MG tablet Take 1 tablet (5 mg total) by mouth 2 (two) times daily. 180 tablet 2   amoxicillin (AMOXIL) 500 MG capsule Take 500 mg by mouth 3 (three) times daily. Haven't picked up yet     aspirin EC 81 MG tablet Take by mouth.     clopidogrel (PLAVIX) 75 MG tablet TAKE 1 TABLET BY MOUTH DAILY 90 tablet 3   lisinopril-hydrochlorothiazide (ZESTORETIC) 20-12.5 MG tablet Take 2 tablets by mouth daily. 180 tablet 2   metoprolol tartrate (LOPRESSOR) 50 MG tablet TAKE 1 TABLET BY MOUTH TWICE A DAY 180 tablet 1   No current facility-administered medications on file prior to visit.    There are no Patient  Instructions on file for this visit. No follow-ups on file.   Georgiana Spinner, NP

## 2023-02-21 DIAGNOSIS — H25013 Cortical age-related cataract, bilateral: Secondary | ICD-10-CM | POA: Diagnosis not present

## 2023-02-21 DIAGNOSIS — H2513 Age-related nuclear cataract, bilateral: Secondary | ICD-10-CM | POA: Diagnosis not present

## 2023-02-27 NOTE — Telephone Encounter (Signed)
Spoke with the  patient's daughter and he is scheduled on 03/14/23 with a 12:00 pm arrival time to the [facility] for a LLE angio with Dr. Wyn Quaker. Pre-procedure instructions were discussed and will  be mailed. daughter was offered 03/04/23 but declined due to a prior appt.

## 2023-03-05 ENCOUNTER — Telehealth (INDEPENDENT_AMBULATORY_CARE_PROVIDER_SITE_OTHER): Payer: Self-pay | Admitting: Nurse Practitioner

## 2023-03-05 DIAGNOSIS — H2513 Age-related nuclear cataract, bilateral: Secondary | ICD-10-CM | POA: Diagnosis not present

## 2023-03-05 NOTE — Telephone Encounter (Signed)
Order has been faxed to [facility]

## 2023-03-05 NOTE — Telephone Encounter (Signed)
Patient granddaughter called stating that patient is needing a new socket. Current socket is no longer fitting. Reach out to [facility] rep Leontine Locket. Patient is also needing PT patient is starting to walk off balance    Please call grand daughter and advise

## 2023-03-05 NOTE — Telephone Encounter (Signed)
Rx on your desk  

## 2023-03-06 DIAGNOSIS — I739 Peripheral vascular disease, unspecified: Secondary | ICD-10-CM | POA: Diagnosis not present

## 2023-03-06 DIAGNOSIS — E785 Hyperlipidemia, unspecified: Secondary | ICD-10-CM | POA: Diagnosis not present

## 2023-03-06 DIAGNOSIS — I10 Essential (primary) hypertension: Secondary | ICD-10-CM | POA: Diagnosis not present

## 2023-03-06 DIAGNOSIS — I69319 Unspecified symptoms and signs involving cognitive functions following cerebral infarction: Secondary | ICD-10-CM | POA: Diagnosis not present

## 2023-03-07 ENCOUNTER — Ambulatory Visit: Payer: Medicare Other | Admitting: Anesthesiology

## 2023-03-08 ENCOUNTER — Other Ambulatory Visit: Payer: Self-pay | Admitting: Nurse Practitioner

## 2023-03-08 NOTE — Telephone Encounter (Signed)
Refill request for lisinopril-hydrochlorothiazide (ZESTORETIC) 20-12.5 MG tablet  LOV- 09/20/22 [place]- Not Scheduled LR- 05/17/22 ( 180 tabs/ 2 refills)

## 2023-03-12 ENCOUNTER — Encounter: Payer: Self-pay | Admitting: Ophthalmology

## 2023-03-13 NOTE — Anesthesia Preprocedure Evaluation (Addendum)
Anesthesia Evaluation  Patient identified by MRN, date of birth, ID band Patient awake    Reviewed: Allergy & Precautions, H&P , NPO status , Patient's Chart, lab work & pertinent test results  Airway        Dental   Pulmonary former smoker          Cardiovascular hypertension, + Peripheral Vascular Disease    Was having cardiac stent last week, but procedure was not completed.  Granddaughter says he is confused and has been since last week.    Neuro/Psych CVA negative neurological ROS  negative psych ROS   GI/Hepatic negative GI ROS, Neg liver ROS,,,  Endo/Other  negative endocrine ROS    Renal/GU negative Renal ROS  negative genitourinary   Musculoskeletal negative musculoskeletal ROS (+)    Abdominal   Peds negative pediatric ROS (+)  Hematology negative hematology ROS (+)   Anesthesia Other Findings Hypertension  Gout Peripheral vascular disease (HCC)  HLD (hyperlipidemia) Stroke (HCC)  Hx of BKA, left  Employs prosthetic leg  Memory changes    Reproductive/Obstetrics negative OB ROS                             Anesthesia Physical Anesthesia Plan Anesthesia Quick Evaluation

## 2023-03-14 ENCOUNTER — Encounter: Payer: Self-pay | Admitting: Vascular Surgery

## 2023-03-14 ENCOUNTER — Encounter
Admission: RE | Disposition: A | Discharge status: Home / Self Care | Payer: Self-pay | Source: Home / Self Care | Attending: Vascular Surgery

## 2023-03-14 ENCOUNTER — Other Ambulatory Visit: Payer: Self-pay

## 2023-03-14 ENCOUNTER — Ambulatory Visit
Admission: RE | Admit: 2023-03-14 | Discharge: 2023-03-14 | Disposition: A | Discharge status: Home / Self Care | Payer: Medicare Other | Attending: Vascular Surgery | Admitting: Vascular Surgery

## 2023-03-14 DIAGNOSIS — I70222 Atherosclerosis of native arteries of extremities with rest pain, left leg: Secondary | ICD-10-CM | POA: Diagnosis not present

## 2023-03-14 DIAGNOSIS — I70212 Atherosclerosis of native arteries of extremities with intermittent claudication, left leg: Secondary | ICD-10-CM | POA: Insufficient documentation

## 2023-03-14 DIAGNOSIS — Z95828 Presence of other vascular implants and grafts: Secondary | ICD-10-CM

## 2023-03-14 DIAGNOSIS — Z87891 Personal history of nicotine dependence: Secondary | ICD-10-CM | POA: Diagnosis not present

## 2023-03-14 DIAGNOSIS — Z89511 Acquired absence of right leg below knee: Secondary | ICD-10-CM | POA: Diagnosis not present

## 2023-03-14 DIAGNOSIS — I70219 Atherosclerosis of native arteries of extremities with intermittent claudication, unspecified extremity: Secondary | ICD-10-CM

## 2023-03-14 DIAGNOSIS — E785 Hyperlipidemia, unspecified: Secondary | ICD-10-CM | POA: Insufficient documentation

## 2023-03-14 HISTORY — PX: LOWER EXTREMITY ANGIOGRAPHY: CATH118251

## 2023-03-14 LAB — CREATININE, SERUM
Creatinine, Ser: 2.03 mg/dL — ABNORMAL HIGH (ref 0.61–1.24)
GFR, Estimated: 34 mL/min — ABNORMAL LOW (ref >60)

## 2023-03-14 LAB — BUN: BUN: 40 mg/dL — ABNORMAL HIGH (ref 8–23)

## 2023-03-14 SURGERY — LOWER EXTREMITY ANGIOGRAPHY
Anesthesia: Moderate Sedation | Site: Leg Lower | Laterality: Left

## 2023-03-14 MED ORDER — FAMOTIDINE 20 MG PO TABS: 40.0000 mg | ORAL_TABLET | Freq: Once | ORAL | Status: DC | PRN

## 2023-03-14 MED ORDER — FENTANYL CITRATE (PF) 100 MCG/2ML IJ SOLN
INTRAMUSCULAR | Status: DC | PRN
  Administered 2023-03-14 (×3): 25 ug via INTRAVENOUS

## 2023-03-14 MED ORDER — MIDAZOLAM HCL 2 MG/2ML IJ SOLN
INTRAMUSCULAR | Status: AC
  Filled 2023-03-14: qty 2

## 2023-03-14 MED ORDER — SODIUM CHLORIDE 0.9 % IV SOLN: INTRAVENOUS | Status: DC

## 2023-03-14 MED ORDER — DIPHENHYDRAMINE HCL 50 MG/ML IJ SOLN: 50.0000 mg | Freq: Once | INTRAMUSCULAR | Status: DC | PRN

## 2023-03-14 MED ORDER — HYDROMORPHONE HCL 1 MG/ML IJ SOLN: 1.0000 mg | Freq: Once | INTRAMUSCULAR | Status: DC | PRN

## 2023-03-14 MED ORDER — HEPARIN SODIUM (PORCINE) 1000 UNIT/ML IJ SOLN
INTRAMUSCULAR | Status: AC
  Filled 2023-03-14: qty 10

## 2023-03-14 MED ORDER — ONDANSETRON HCL 4 MG/2ML IJ SOLN: 4.0000 mg | Freq: Four times a day (QID) | INTRAMUSCULAR | Status: DC | PRN

## 2023-03-14 MED ORDER — METHYLPREDNISOLONE SODIUM SUCC 125 MG IJ SOLR: 125.0000 mg | Freq: Once | INTRAMUSCULAR | Status: DC | PRN

## 2023-03-14 MED ORDER — CEFAZOLIN SODIUM-DEXTROSE 1-4 GM/50ML-% IV SOLN
INTRAVENOUS | Status: DC | PRN
  Administered 2023-03-14: 2 g via INTRAVENOUS

## 2023-03-14 MED ORDER — FENTANYL CITRATE PF 50 MCG/ML IJ SOSY
PREFILLED_SYRINGE | INTRAMUSCULAR | Status: AC
  Filled 2023-03-14: qty 1

## 2023-03-14 MED ORDER — CEFAZOLIN SODIUM-DEXTROSE 2-4 GM/100ML-% IV SOLN: 2.0000 g | INTRAVENOUS | Status: DC

## 2023-03-14 MED ORDER — IODIXANOL 320 MG/ML IV SOLN
INTRAVENOUS | Status: DC | PRN
  Administered 2023-03-14: 40 mL via INTRA_ARTERIAL

## 2023-03-14 MED ORDER — MIDAZOLAM HCL 2 MG/2ML IJ SOLN
INTRAMUSCULAR | Status: DC | PRN
  Administered 2023-03-14 (×3): 1 mg via INTRAVENOUS

## 2023-03-14 MED ORDER — LACTATED RINGERS IV SOLN: INTRAVENOUS | Status: DC

## 2023-03-14 MED ORDER — HEPARIN SODIUM (PORCINE) 1000 UNIT/ML IJ SOLN
INTRAMUSCULAR | Status: DC | PRN
  Administered 2023-03-14: 5000 [IU] via INTRAVENOUS

## 2023-03-14 MED ORDER — CEFAZOLIN SODIUM-DEXTROSE 2-4 GM/100ML-% IV SOLN
INTRAVENOUS | Status: AC
  Filled 2023-03-14: qty 100

## 2023-03-14 MED ORDER — MIDAZOLAM HCL 2 MG/ML PO SYRP: 8.0000 mg | ORAL_SOLUTION | Freq: Once | ORAL | Status: DC | PRN

## 2023-03-14 SURGICAL SUPPLY — 13 items
CATH 0.018 NAVICROSS ANG 135 (CATHETERS) IMPLANT
CATH ANGIO 5F PIGTAIL 65CM (CATHETERS) IMPLANT
CATH BEACON 5 .038 100 VERT TP (CATHETERS) IMPLANT
DEVICE STARCLOSE SE CLOSURE (Vascular Products) IMPLANT
GLIDEWIRE ADV .035X260CM (WIRE) IMPLANT
GUIDEWIRE PFTE-COATED .018X300 (WIRE) IMPLANT
KIT ENCORE 26 ADVANTAGE (KITS) IMPLANT
PACK ANGIOGRAPHY (CUSTOM PROCEDURE TRAY) ×1 IMPLANT
SHEATH ANL2 6FRX45 HC (SHEATH) IMPLANT
SHEATH BRITE TIP 5FRX11 (SHEATH) IMPLANT
SYR MEDRAD MARK 7 150ML (SYRINGE) IMPLANT
TUBING CONTRAST HIGH PRESS 72 (TUBING) IMPLANT
WIRE GUIDERIGHT .035X150 (WIRE) IMPLANT

## 2023-03-14 NOTE — Op Note (Signed)
[facility]  Percutaneous Study/Intervention Procedural Note   Date of Surgery: 03/14/2023  Surgeon(s):Wenona Mayville    Assistants:none  Pre-operative Diagnosis: PAD with rest pain left lower extremity  Post-operative diagnosis:  Same  Procedure(s) Performed:             1.  Ultrasound guidance for vascular access right common femoral artery             2.  Catheter placement into left SFA from right femoral approach             3.  Aortogram and selective left lower extremity angiogram             4.  StarClose closure device right femoral artery  EBL: 5 cc  Contrast: 40 cc  Fluoro Time: 15.7 minutes  Moderate Conscious Sedation Time: approximately 56 minutes using 3 mg of Versed and 75 mcg of Fentanyl              Indications:  Patient is a 72 y.o.male with severe peripheral arterial disease with rest pain of the left lower extremity. The patient has noninvasive study showing reduced perfusion on the left. The patient is brought in for angiography for further evaluation and potential treatment.  Due to the limb threatening nature of the situation, angiogram was performed for attempted limb salvage. The patient is aware that if the procedure fails, amputation would be expected.  The patient also understands that even with successful revascularization, amputation may still be required due to the severity of the situation.  Risks and benefits are discussed and informed consent is obtained.   Procedure:  The patient was identified and appropriate procedural time out was performed.  The patient was then placed supine on the table and prepped and draped in the usual sterile fashion. Moderate conscious sedation was administered during a face to face encounter with the patient throughout the procedure with my supervision of the RN administering medicines and monitoring the patient's vital signs, pulse oximetry, telemetry and mental status throughout from the start of the  procedure until the patient was taken to the recovery room. Ultrasound was used to evaluate the right common femoral artery.  It was patent .  A digital ultrasound image was acquired.  A Seldinger needle was used to access the right common femoral artery under direct ultrasound guidance and a permanent image was performed.  A 0.035 J wire was advanced without resistance and a 5Fr sheath was placed.  Pigtail catheter was placed into the aorta and an AP aortogram was performed. This demonstrated somewhat sluggish renal artery flow although there were not any obvious stenoses.  And normal aorta and iliac segments without significant stenosis after previous stenting in the left iliac artery. I then crossed the aortic bifurcation and advanced to the left femoral head. Selective left lower extremity angiogram was then performed. This demonstrated mild disease of the common femoral artery with a large profunda femoris artery.  There is a near flush occlusion of the left SFA at its origin.  This reconstituted the above-knee popliteal artery which was heavily diseased and appeared to either have a high-grade stenosis or occlusion again in the midsegment.  The disease continued down to the tibioperoneal trunk where the peroneal artery was essentially the only runoff distally and was quite large.  The tibioperoneal trunk had about a 75 to 80% stenosis. It was felt that it was in the patient's best interest to proceed with intervention after these images to  avoid a second procedure and a larger amount of contrast and fluoroscopy based off of the findings from the initial angiogram. The patient was systemically heparinized and a 6 [place] An[place] sheath was then placed over the [employer] wire. I then used a Kumpe catheter and the advantage wire to get into the SFA occlusion.  Tediously I tried to cross this and selective images were performed throughout the path in the SFA all the way down to the popliteal artery but I  could never regain intraluminal access despite a variety of catheters and wires including a 0.035 advantage wire and a 0.018 advantage wire.  A Kumpe catheter as well a 0.018 CXI catheter were also used.  After significant fluoroscopy time and large subintimal channels that were clearly not going to be crossed, I felt there was no further use of attempting this today.  He can be brought back for another attempt after the false channels heal in 2 to 3 weeks. I elected to terminate the procedure. The sheath was removed and StarClose closure device was deployed in the right femoral artery with excellent hemostatic result. The patient was taken to the recovery room in stable condition having tolerated the procedure well.  Findings:               Aortogram:  This demonstrated somewhat sluggish renal artery flow although there were not any obvious stenoses.  And normal aorta and iliac segments without significant stenosis after previous stenting in the left iliac artery.             Left lower Extremity:  This demonstrated mild disease of the common femoral artery with a large profunda femoris artery.  There is a near flush occlusion of the left SFA at its origin.  This reconstituted the above-knee popliteal artery which was heavily diseased and appeared to either have a high-grade stenosis or occlusion again in the midsegment.  The disease continued down to the tibioperoneal trunk where the peroneal artery was essentially the only runoff distally and was quite large.  The tibioperoneal trunk had about a 75 to 80% stenosis.   Disposition: Patient was taken to the recovery room in stable condition having tolerated the procedure well.  Complications: None  Festus Barren 03/14/2023 5:32 PM   This note was created with Dragon Medical transcription system. Any errors in dictation are purely unintentional.

## 2023-03-14 NOTE — H&P (Signed)
[facility] Admission History & Physical  MRN : [id number]  Jordan Mills is a 72 y.o. (31-Jul-1951) male who presents with chief complaint of No chief complaint on file. Marland Kitchen  History of Present Illness: Patient presents today for angiography of the left lower extremity.  Has markedly reduced left ABI at 0.47.  Has already lost the right leg.  Now having rest pain and preulcerative changes to the feet with no open wounds at this time.  Current Facility-Administered Medications  Medication Dose Route Frequency Provider Last Rate Last Admin   0.9 %  sodium chloride infusion   Intravenous Continuous Georgiana Spinner, NP       ceFAZolin (ANCEF) IVPB 2g/100 mL premix  2 g Intravenous 30 min Pre-Op Georgiana Spinner, NP       diphenhydrAMINE (BENADRYL) injection 50 mg  50 mg Intravenous Once PRN Georgiana Spinner, NP       famotidine (PEPCID) tablet 40 mg  40 mg Oral Once PRN Georgiana Spinner, NP       HYDROmorphone (DILAUDID) injection 1 mg  1 mg Intravenous Once PRN Georgiana Spinner, NP       methylPREDNISolone sodium succinate (SOLU-MEDROL) 125 mg/2 mL injection 125 mg  125 mg Intravenous Once PRN Georgiana Spinner, NP       midazolam (VERSED) 2 MG/ML syrup 8 mg  8 mg Oral Once PRN Georgiana Spinner, NP       ondansetron [facility]) injection 4 mg  4 mg Intravenous Q6H PRN Georgiana Spinner, NP        Past Medical History:  Diagnosis Date   Employs prosthetic leg    left   Gout    HLD (hyperlipidemia)    Hx of BKA, left (HCC)    Hypertension    Memory changes    Peripheral vascular disease (HCC)    Stroke [facility])    possible old infarct on CT 02/07/23.  No deficits    Past Surgical History:  Procedure Laterality Date   AMPUTATION Right 08/28/2017   Procedure: AMPUTATION BELOW KNEE;  Surgeon: Annice Needy, MD;  Location: [facility] ORS;  Service: Vascular;  Laterality: Right;   AMPUTATION TOE Right 07/19/2017   Procedure: AMPUTATION TOE/Rt 4th MPJ;  Surgeon: Linus Galas, DPM;   Location: [facility] ORS;  Service: Podiatry;  Laterality: Right;   LOWER EXTREMITY ANGIOGRAPHY Right 07/01/2017   Procedure: Lower Extremity Angiography;  Surgeon: Annice Needy, MD;  Location: [facility] INVASIVE CV LAB;  Service: Cardiovascular;  Laterality: Right;   LOWER EXTREMITY ANGIOGRAPHY Right 08/15/2017   Procedure: Lower Extremity Angiography;  Surgeon: Annice Needy, MD;  Location: [facility] INVASIVE CV LAB;  Service: Cardiovascular;  Laterality: Right;   LOWER EXTREMITY ANGIOGRAPHY Right 08/16/2017   Procedure: LOWER EXTREMITY ANGIOGRAPHY;  Surgeon: Annice Needy, MD;  Location: [facility] INVASIVE CV LAB;  Service: Cardiovascular;  Laterality: Right;   LOWER EXTREMITY INTERVENTION  08/15/2017   Procedure: LOWER EXTREMITY INTERVENTION;  Surgeon: Annice Needy, MD;  Location: [facility] INVASIVE CV LAB;  Service: Cardiovascular;;   TONSILLECTOMY       Social History   Tobacco Use   Smoking status: Former    Packs/day: .2    Types: Cigarettes    Quit date: 2018    Years since quitting: 6.4   Smokeless tobacco: Never  Vaping Use   Vaping Use: Former  Substance Use Topics   Alcohol use: No   Drug use: No  Family History  Problem Relation Age of Onset   Hypertension Mother    Hyperlipidemia Mother    Diabetes Mother    Stroke Mother    Lung cancer Sister     No Known Allergies   REVIEW OF SYSTEMS (Negative unless checked)  Constitutional: [] Weight loss  [] Fever  [] Chills Cardiac: [] Chest pain   [] Chest pressure   [] Palpitations   [] Shortness of breath when laying flat   [] Shortness of breath at rest   [] Shortness of breath with exertion. Vascular:  [] Pain in legs with walking   [] Pain in legs at rest   [] Pain in legs when laying flat   [] Claudication   [] Pain in feet when walking  [] Pain in feet at rest  [] Pain in feet when laying flat   [] History of DVT   [] Phlebitis   [] Swelling in legs   [] Varicose veins   [] Non-healing ulcers Pulmonary:   [] Uses home oxygen   [] Productive cough    [] Hemoptysis   [] Wheeze  [] COPD   [] Asthma Neurologic:  [] Dizziness  [] Blackouts   [] Seizures   [x] History of stroke   [] History of TIA  [] Aphasia   [] Temporary blindness   [] Dysphagia   [] Weakness or numbness in arms   [x] Weakness or numbness in legs Musculoskeletal:  [x] Arthritis   [] Joint swelling   [] Joint pain   [] Low back pain Hematologic:  [] Easy bruising  [] Easy bleeding   [] Hypercoagulable state   [] Anemic  [] Hepatitis Gastrointestinal:  [] Blood in stool   [] Vomiting blood  [] Gastroesophageal reflux/heartburn   [] Difficulty swallowing. Genitourinary:  [] Chronic kidney disease   [] Difficult urination  [] Frequent urination  [] Burning with urination   [] Blood in urine Skin:  [] Rashes   [] Ulcers   [] Wounds Psychological:  [] History of anxiety   []  History of major depression.  Physical Examination  There were no vitals filed for this visit. There is no height or weight on file to calculate BMI. Gen: WD/WN, NAD Head: [place]/AT, No temporalis wasting.  Ear/Nose/Throat: Hearing grossly intact, nares w/o erythema or drainage, oropharynx w/o Erythema/Exudate,  Eyes: Conjunctiva clear, sclera non-icteric Neck: Trachea midline.  No JVD.  Pulmonary:  Good air movement, respirations not labored, no use of accessory muscles.  Cardiac: RRR, normal S1, S2. Vascular:  Vessel Right Left  Radial Palpable Palpable                          PT Not Palpable Not Palpable  DP Not Palpable Not Palpable   Musculoskeletal: M/S 5/5 throughout.  Extremities without ischemic changes.  Right BKA. Neurologic: Sensation grossly intact in extremities.  Symmetrical.  Speech is fluent. Motor exam as listed above. Psychiatric: Judgment intact, Mood & affect appropriate for pt's clinical situation. Dermatologic: No rashes or ulcers noted.  No cellulitis or open wounds.      CBC Lab Results  Component Value Date   WBC 16.8 (H) 02/07/2023   HGB 13.0 02/07/2023   HCT 41.5 02/07/2023   MCV 92.2 02/07/2023    PLT 191 02/07/2023    BMET    Component Value Date/Time   NA 135 02/07/2023 1311   K 4.7 02/07/2023 1311   CL 107 02/07/2023 1311   CO2 20 (L) 02/07/2023 1311   GLUCOSE 105 (H) 02/07/2023 1311   BUN 16 02/07/2023 1311   CREATININE 1.72 (H) 02/07/2023 1311   CREATININE 1.75 (H) 11/21/2021 1204   CALCIUM 8.5 (L) 02/07/2023 1311   GFRNONAA 41 (L) 02/07/2023 1311   GFRAA  52 (L) 09/17/2019 1543   CrCl cannot be calculated (Patient's most recent lab result is older than the maximum 21 days allowed.).  COAG Lab Results  Component Value Date   INR 2.01 08/25/2017    Radiology No results found.   Assessment/Plan 1. Atherosclerosis of native artery of left lower extremity with intermittent claudication (HCC) Recommend:   The patient has experienced increased claudication symptoms and is now describing lifestyle limiting claudication and appears to be having mild rest pain symptroms.   Given the severity of the patient's severe left lower extremity symptoms the patient should undergo angiography with the hope for intervention.  Risk and benefits were reviewed the patient.  Indications for the procedure were reviewed.  All questions were answered, the patient agrees to proceed with left lower extremity angiography and possible intervention.    The patient should continue walking and begin a more formal exercise program.  The patient should continue antiplatelet therapy and aggressive treatment of the lipid abnormalities   The patient will follow up with me after the angiogram.    2. Hx of BKA, right (HCC) Currently the patient has an ill-fitting prosthetic.  Patient would benefit from refitting and updated prosthetic.   3. Tobacco use disorder Smoking cessation was discussed, 3-10 minutes spent on this topic specifically   4. Hyperlipidemia, unspecified hyperlipidemia type Continue statin as ordered and reviewed, no changes at this time   Festus Barren, MD  03/14/2023 12:14  PM

## 2023-03-15 NOTE — Discharge Instructions (Signed)

## 2023-03-18 ENCOUNTER — Encounter: Payer: Self-pay | Admitting: Vascular Surgery

## 2023-03-19 ENCOUNTER — Encounter
Admission: RE | Disposition: A | Discharge status: Home / Self Care | Payer: Self-pay | Source: Home / Self Care | Attending: Ophthalmology

## 2023-03-19 ENCOUNTER — Other Ambulatory Visit: Payer: Self-pay

## 2023-03-19 ENCOUNTER — Ambulatory Visit
Admission: RE | Admit: 2023-03-19 | Discharge: 2023-03-19 | Disposition: A | Discharge status: Home / Self Care | Payer: Medicare Other | Attending: Ophthalmology | Admitting: Ophthalmology

## 2023-03-19 DIAGNOSIS — Z09 Encounter for follow-up examination after completed treatment for conditions other than malignant neoplasm: Secondary | ICD-10-CM | POA: Insufficient documentation

## 2023-03-19 DIAGNOSIS — E785 Hyperlipidemia, unspecified: Secondary | ICD-10-CM | POA: Diagnosis not present

## 2023-03-19 DIAGNOSIS — Z8673 Personal history of transient ischemic attack (TIA), and cerebral infarction without residual deficits: Secondary | ICD-10-CM | POA: Insufficient documentation

## 2023-03-19 DIAGNOSIS — I739 Peripheral vascular disease, unspecified: Secondary | ICD-10-CM | POA: Diagnosis not present

## 2023-03-19 DIAGNOSIS — R410 Disorientation, unspecified: Secondary | ICD-10-CM | POA: Insufficient documentation

## 2023-03-19 DIAGNOSIS — Z539 Procedure and treatment not carried out, unspecified reason: Secondary | ICD-10-CM | POA: Insufficient documentation

## 2023-03-19 DIAGNOSIS — Z87891 Personal history of nicotine dependence: Secondary | ICD-10-CM | POA: Diagnosis not present

## 2023-03-19 DIAGNOSIS — Z89512 Acquired absence of left leg below knee: Secondary | ICD-10-CM | POA: Insufficient documentation

## 2023-03-19 DIAGNOSIS — I10 Essential (primary) hypertension: Secondary | ICD-10-CM | POA: Insufficient documentation

## 2023-03-19 DIAGNOSIS — H2511 Age-related nuclear cataract, right eye: Secondary | ICD-10-CM | POA: Insufficient documentation

## 2023-03-19 HISTORY — DX: Presence of artificial limb (complete) (partial), unspecified: Z97.10

## 2023-03-19 HISTORY — DX: Acquired absence of left leg below knee: Z89.512

## 2023-03-19 HISTORY — DX: Cerebral infarction, unspecified: I63.9

## 2023-03-19 HISTORY — DX: Other amnesia: R41.3

## 2023-03-19 SURGERY — PHACOEMULSIFICATION, CATARACT, WITH IOL INSERTION
Anesthesia: Topical | Laterality: Right

## 2023-03-19 SURGICAL SUPPLY — 9 items
ANGLE REVERSE CUT SHRT 25GA (CUTTER) ×1
CANNULA ANT/CHMB 27GA (MISCELLANEOUS) IMPLANT
CATARACT SUITE SIGHTPATH (MISCELLANEOUS) ×1 IMPLANT
CYSTOTOME ANGL RVRS SHRT 25GA (CUTTER) ×1 IMPLANT
GLOVE BIOGEL PI IND STRL 8 (GLOVE) ×1 IMPLANT
GLOVE SURG ENC TEXT LTX SZ8 (GLOVE) ×1 IMPLANT
NEEDLE FILTER BLUNT 18X1 1/2 (NEEDLE) ×1 IMPLANT
RING MALYGIN (MISCELLANEOUS) IMPLANT
SYR 3ML LL SCALE MARK (SYRINGE) ×1 IMPLANT

## 2023-03-19 NOTE — H&P (Signed)
[facility]   Primary Care Physician:  Corky Downs, MD Ophthalmologist: Dr. Druscilla Brownie  Pre-Procedure History & Physical: HPI:  Jordan Mills is a 72 y.o. male here for cataract surgery.   Past Medical History:  Diagnosis Date   Employs prosthetic leg    left   Gout    HLD (hyperlipidemia)    Hx of BKA, left (HCC)    Hypertension    Memory changes    Peripheral vascular disease (HCC)    Stroke [facility])    possible old infarct on CT 02/07/23.  No deficits    Past Surgical History:  Procedure Laterality Date   AMPUTATION Right 08/28/2017   Procedure: AMPUTATION BELOW KNEE;  Surgeon: Annice Needy, MD;  Location: [facility] ORS;  Service: Vascular;  Laterality: Right;   AMPUTATION TOE Right 07/19/2017   Procedure: AMPUTATION TOE/Rt 4th MPJ;  Surgeon: Linus Galas, DPM;  Location: [facility] ORS;  Service: Podiatry;  Laterality: Right;   LOWER EXTREMITY ANGIOGRAPHY Right 07/01/2017   Procedure: Lower Extremity Angiography;  Surgeon: Annice Needy, MD;  Location: [facility] INVASIVE CV LAB;  Service: Cardiovascular;  Laterality: Right;   LOWER EXTREMITY ANGIOGRAPHY Right 08/15/2017   Procedure: Lower Extremity Angiography;  Surgeon: Annice Needy, MD;  Location: [facility] INV[facility] CV LAB;  Service: Cardiovascular;  Laterality: Right;   LOWER EXTREMITY ANGIOGRAPHY Right 08/16/2017   Procedure: LOWER EXTREMITY ANGIOGRAPHY;  Surgeon: Annice Needy, MD;  Location: [facility] INVAS[facility] CV LAB;  Service: Cardiovascular;  Laterality: Right;   LOWER EXTREMITY ANGIOGRAPHY Left 03/14/2023   Procedure: Lower Extremity Angiography;  Surgeon: Annice Needy, MD;  Location: [facility] INVASIVE CV LAB;  Service: Cardiovascular;  Laterality: Left;   LOWER EXTREMITY INTERVENTION  08/15/2017   Procedure: LOWER EXTREMITY INTERVENTION;  Surgeon: Annice Needy, MD;  Location: [facility] INVASIVE CV LAB;  Service: Cardiovascular;;   TONSILLECTOMY      Prior to Admission medications   Medication Sig Start Date End Date Taking? Authorizing Provider   aspirin EC 81 MG tablet Take by mouth.   Yes [provider]  lisinopril-hydrochlorothiazide (ZESTORETIC) 20-12.5 MG tablet TAKE 2 TABLETS BY MOUTH EVERY DAY 03/08/23  Yes Kara Dies, NP  metoprolol tartrate (LOPRESSOR) 50 MG tablet TAKE 1 TABLET BY MOUTH TWICE A DAY 06/04/22  Yes Masoud, Renda Rolls, MD  amLODipine (NORVASC) 5 MG tablet Take 1 tablet (5 mg total) by mouth 2 (two) times daily. Patient not taking: Reported on 03/12/2023 05/17/22   Kara Dies, NP  amoxicillin (AMOXIL) 500 MG capsule Take 500 mg by mouth 3 (three) times daily. Haven't picked up yet Patient not taking: Reported on 03/12/2023 01/07/23   [provider]  clopidogrel (PLAVIX) 75 MG tablet TAKE 1 TABLET BY MOUTH DAILY Patient not taking: Reported on 03/12/2023 09/03/18   Annice Needy, MD    Allergies as of 02/28/2023   (No Known Allergies)    Family History  Problem Relation Age of Onset   Hypertension Mother    Hyperlipidemia Mother    Diabetes Mother    Stroke Mother    Lung cancer Sister     Social History   Socioeconomic History   Marital status: Married    Spouse name: Not on file   Number of children: Not on file   Years of education: Not on file   Highest education level: Not on file  Occupational History   Not on file  Tobacco Use   Smoking status: Former    Packs/day: .2  Types: Cigarettes    Quit date: 2018    Years since quitting: 6.4   Smokeless tobacco: Never  Vaping Use   Vaping Use: Former  Substance and Sexual Activity   Alcohol use: No   Drug use: No   Sexual activity: Not on file  Other Topics Concern   Not on file  Social History Narrative   Not on file   Social Determinants of Health   Financial Resource Strain: Low Risk  (03/29/2022)   Overall Financial Resource Strain (CARDIA)    Difficulty of Paying Living Expenses: Not very hard  Food Insecurity: No Food Insecurity (03/29/2022)   Hunger Vital Sign    Worried About Running Out of Food in  the Last Year: Never true    Ran Out of Food in the Last Year: Never true  Transportation Needs: No Transportation Needs (03/29/2022)   PRAPARE - Administrator, Civil Service (Medical): No    Lack of Transportation (Non-Medical): No  Physical Activity: Insufficiently Active (03/29/2022)   Exercise Vital Sign    Days of Exercise per Week: 4 days    Minutes of Exercise per Session: 30 min  Stress: No Stress Concern Present (03/29/2022)   [employer] of Occupational Health - Occupational Stress Questionnaire    Feeling of Stress : Not at all  Social Connections: Moderately Isolated (03/29/2022)   Social Connection and Isolation Panel [NHANES]    Frequency of Communication with Friends and Family: More than three times a week    Frequency of Social Gatherings with Friends and Family: More than three times a week    Attends Religious Services: Never    Database administrator or Organizations: No    Attends Banker Meetings: Never    Marital Status: Married  Catering manager Violence: Not At Risk (03/29/2022)   Humiliation, Afraid, Rape, and Kick questionnaire    Fear of Current or Ex-Partner: No    Emotionally Abused: No    Physically Abused: No    Sexually Abused: No    Review of Systems: See HPI, otherwise negative ROS  Physical Exam: Ht 5\' 7"  (1.702 m)   Wt 65.8 kg   BMI 22.71 kg/m  General:   Alert, cooperative in NAD Head:  Normocephalic and atraumatic. Respiratory:  Normal work of breathing. Cardiovascular:  RRR  Impression/Plan: Jordan Mills is here for cataract surgery.  Risks, benefits, limitations, and alternatives regarding cataract surgery have been reviewed with the patient.  Questions have been answered.  All parties agreeable.   Galen Manila, MD  03/19/2023, 11:16 AM

## 2023-03-19 NOTE — Progress Notes (Signed)
Pt. Cancelled in pre-op due to inability to consent patient. See Anes note for additional information.

## 2023-03-21 DIAGNOSIS — J9811 Atelectasis: Secondary | ICD-10-CM | POA: Diagnosis not present

## 2023-03-21 DIAGNOSIS — R638 Other symptoms and signs concerning food and fluid intake: Secondary | ICD-10-CM | POA: Diagnosis not present

## 2023-03-21 DIAGNOSIS — R000 Tachycardia, unspecified: Secondary | ICD-10-CM | POA: Diagnosis not present

## 2023-03-21 DIAGNOSIS — I517 Cardiomegaly: Secondary | ICD-10-CM | POA: Diagnosis not present

## 2023-03-21 DIAGNOSIS — R9431 Abnormal electrocardiogram [ECG] [EKG]: Secondary | ICD-10-CM | POA: Diagnosis not present

## 2023-03-21 DIAGNOSIS — Z5181 Encounter for therapeutic drug level monitoring: Secondary | ICD-10-CM | POA: Diagnosis not present

## 2023-03-21 DIAGNOSIS — Z79899 Other long term (current) drug therapy: Secondary | ICD-10-CM | POA: Diagnosis not present

## 2023-03-21 DIAGNOSIS — Z8673 Personal history of transient ischemic attack (TIA), and cerebral infarction without residual deficits: Secondary | ICD-10-CM | POA: Diagnosis not present

## 2023-03-21 DIAGNOSIS — R627 Adult failure to thrive: Secondary | ICD-10-CM | POA: Diagnosis not present

## 2023-03-21 DIAGNOSIS — R4182 Altered mental status, unspecified: Secondary | ICD-10-CM | POA: Diagnosis not present

## 2023-03-21 DIAGNOSIS — I639 Cerebral infarction, unspecified: Secondary | ICD-10-CM | POA: Diagnosis not present

## 2023-03-21 DIAGNOSIS — N179 Acute kidney failure, unspecified: Secondary | ICD-10-CM | POA: Diagnosis not present

## 2023-03-22 DIAGNOSIS — R0600 Dyspnea, unspecified: Secondary | ICD-10-CM | POA: Diagnosis not present

## 2023-03-22 DIAGNOSIS — I70209 Unspecified atherosclerosis of native arteries of extremities, unspecified extremity: Secondary | ICD-10-CM | POA: Diagnosis not present

## 2023-03-22 DIAGNOSIS — Z66 Do not resuscitate: Secondary | ICD-10-CM | POA: Diagnosis not present

## 2023-03-22 DIAGNOSIS — N179 Acute kidney failure, unspecified: Secondary | ICD-10-CM | POA: Diagnosis not present

## 2023-03-22 DIAGNOSIS — R4701 Aphasia: Secondary | ICD-10-CM | POA: Diagnosis not present

## 2023-03-22 DIAGNOSIS — R29708 NIHSS score 8: Secondary | ICD-10-CM | POA: Diagnosis not present

## 2023-03-22 DIAGNOSIS — E785 Hyperlipidemia, unspecified: Secondary | ICD-10-CM | POA: Diagnosis not present

## 2023-03-22 DIAGNOSIS — F0390 Unspecified dementia without behavioral disturbance: Secondary | ICD-10-CM | POA: Diagnosis not present

## 2023-03-22 DIAGNOSIS — R5381 Other malaise: Secondary | ICD-10-CM | POA: Diagnosis not present

## 2023-03-22 DIAGNOSIS — I501 Left ventricular failure: Secondary | ICD-10-CM | POA: Diagnosis not present

## 2023-03-22 DIAGNOSIS — G301 Alzheimer's disease with late onset: Secondary | ICD-10-CM | POA: Diagnosis not present

## 2023-03-22 DIAGNOSIS — R52 Pain, unspecified: Secondary | ICD-10-CM | POA: Diagnosis not present

## 2023-03-22 DIAGNOSIS — I739 Peripheral vascular disease, unspecified: Secondary | ICD-10-CM | POA: Diagnosis not present

## 2023-03-22 DIAGNOSIS — H269 Unspecified cataract: Secondary | ICD-10-CM | POA: Diagnosis not present

## 2023-03-22 DIAGNOSIS — R9401 Abnormal electroencephalogram [EEG]: Secondary | ICD-10-CM | POA: Diagnosis not present

## 2023-03-22 DIAGNOSIS — Z7409 Other reduced mobility: Secondary | ICD-10-CM | POA: Diagnosis not present

## 2023-03-22 DIAGNOSIS — M109 Gout, unspecified: Secondary | ICD-10-CM | POA: Diagnosis not present

## 2023-03-22 DIAGNOSIS — F0280 Dementia in other diseases classified elsewhere without behavioral disturbance: Secondary | ICD-10-CM | POA: Diagnosis not present

## 2023-03-22 DIAGNOSIS — Z89511 Acquired absence of right leg below knee: Secondary | ICD-10-CM | POA: Diagnosis not present

## 2023-03-22 DIAGNOSIS — I10 Essential (primary) hypertension: Secondary | ICD-10-CM | POA: Diagnosis not present

## 2023-03-22 DIAGNOSIS — R279 Unspecified lack of coordination: Secondary | ICD-10-CM | POA: Diagnosis not present

## 2023-03-22 DIAGNOSIS — R1310 Dysphagia, unspecified: Secondary | ICD-10-CM | POA: Diagnosis not present

## 2023-03-22 DIAGNOSIS — D631 Anemia in chronic kidney disease: Secondary | ICD-10-CM | POA: Diagnosis not present

## 2023-03-22 DIAGNOSIS — R630 Anorexia: Secondary | ICD-10-CM | POA: Diagnosis not present

## 2023-03-22 DIAGNOSIS — R9089 Other abnormal findings on diagnostic imaging of central nervous system: Secondary | ICD-10-CM | POA: Diagnosis not present

## 2023-03-22 DIAGNOSIS — R627 Adult failure to thrive: Secondary | ICD-10-CM | POA: Diagnosis not present

## 2023-03-22 DIAGNOSIS — I69318 Other symptoms and signs involving cognitive functions following cerebral infarction: Secondary | ICD-10-CM | POA: Diagnosis not present

## 2023-03-22 DIAGNOSIS — I639 Cerebral infarction, unspecified: Secondary | ICD-10-CM | POA: Diagnosis not present

## 2023-03-22 DIAGNOSIS — N189 Chronic kidney disease, unspecified: Secondary | ICD-10-CM | POA: Diagnosis not present

## 2023-03-22 DIAGNOSIS — Z4682 Encounter for fitting and adjustment of non-vascular catheter: Secondary | ICD-10-CM | POA: Diagnosis not present

## 2023-03-22 DIAGNOSIS — R41841 Cognitive communication deficit: Secondary | ICD-10-CM | POA: Diagnosis not present

## 2023-03-22 DIAGNOSIS — I129 Hypertensive chronic kidney disease with stage 1 through stage 4 chronic kidney disease, or unspecified chronic kidney disease: Secondary | ICD-10-CM | POA: Diagnosis not present

## 2023-03-22 DIAGNOSIS — E46 Unspecified protein-calorie malnutrition: Secondary | ICD-10-CM | POA: Diagnosis not present

## 2023-03-22 DIAGNOSIS — M6281 Muscle weakness (generalized): Secondary | ICD-10-CM | POA: Diagnosis not present

## 2023-03-22 DIAGNOSIS — Z515 Encounter for palliative care: Secondary | ICD-10-CM | POA: Diagnosis not present

## 2023-03-22 DIAGNOSIS — G9340 Encephalopathy, unspecified: Secondary | ICD-10-CM | POA: Diagnosis not present

## 2023-03-22 DIAGNOSIS — Z452 Encounter for adjustment and management of vascular access device: Secondary | ICD-10-CM | POA: Diagnosis not present

## 2023-03-22 DIAGNOSIS — Z720 Tobacco use: Secondary | ICD-10-CM | POA: Diagnosis not present

## 2023-03-22 DIAGNOSIS — I69319 Unspecified symptoms and signs involving cognitive functions following cerebral infarction: Secondary | ICD-10-CM | POA: Diagnosis not present

## 2023-03-22 DIAGNOSIS — I63542 Cerebral infarction due to unspecified occlusion or stenosis of left cerebellar artery: Secondary | ICD-10-CM | POA: Diagnosis not present

## 2023-03-22 DIAGNOSIS — Z681 Body mass index (BMI) 19 or less, adult: Secondary | ICD-10-CM | POA: Diagnosis not present

## 2023-03-22 DIAGNOSIS — I998 Other disorder of circulatory system: Secondary | ICD-10-CM | POA: Diagnosis not present

## 2023-03-22 DIAGNOSIS — I619 Nontraumatic intracerebral hemorrhage, unspecified: Secondary | ICD-10-CM | POA: Diagnosis not present

## 2023-03-22 DIAGNOSIS — I252 Old myocardial infarction: Secondary | ICD-10-CM | POA: Diagnosis not present

## 2023-03-22 DIAGNOSIS — I959 Hypotension, unspecified: Secondary | ICD-10-CM | POA: Diagnosis not present

## 2023-03-22 DIAGNOSIS — R638 Other symptoms and signs concerning food and fluid intake: Secondary | ICD-10-CM | POA: Diagnosis not present

## 2023-03-22 DIAGNOSIS — Z8673 Personal history of transient ischemic attack (TIA), and cerebral infarction without residual deficits: Secondary | ICD-10-CM | POA: Diagnosis not present

## 2023-03-22 DIAGNOSIS — Z1152 Encounter for screening for COVID-19: Secondary | ICD-10-CM | POA: Diagnosis not present

## 2023-03-22 DIAGNOSIS — R569 Unspecified convulsions: Secondary | ICD-10-CM | POA: Diagnosis not present

## 2023-03-24 DIAGNOSIS — R638 Other symptoms and signs concerning food and fluid intake: Secondary | ICD-10-CM | POA: Diagnosis not present

## 2023-03-24 DIAGNOSIS — I501 Left ventricular failure: Secondary | ICD-10-CM | POA: Diagnosis not present

## 2023-03-29 ENCOUNTER — Ambulatory Visit (INDEPENDENT_AMBULATORY_CARE_PROVIDER_SITE_OTHER): Payer: Medicare Other | Admitting: Vascular Surgery

## 2023-04-09 ENCOUNTER — Ambulatory Visit: Admission: RE | Admit: 2023-04-09 | Payer: Medicare Other | Source: Home / Self Care | Admitting: Ophthalmology

## 2023-04-09 ENCOUNTER — Encounter: Admission: RE | Payer: Self-pay | Source: Home / Self Care

## 2023-04-09 SURGERY — PHACOEMULSIFICATION, CATARACT, WITH IOL INSERTION
Anesthesia: Topical | Laterality: Left

## 2023-04-15 DEATH — deceased
